# Patient Record
Sex: Male | Born: 1955 | Race: White | Hispanic: No | Marital: Married | Smoking: Former smoker
Health system: Southern US, Community
[De-identification: ages and names within clinical notes are randomized; demographics above are authoritative.]

## PROBLEM LIST (undated history)

## (undated) DIAGNOSIS — M549 Dorsalgia, unspecified: Secondary | ICD-10-CM

## (undated) DIAGNOSIS — C4442 Squamous cell carcinoma of skin of scalp and neck: Secondary | ICD-10-CM

## (undated) DIAGNOSIS — I10 Essential (primary) hypertension: Secondary | ICD-10-CM

## (undated) DIAGNOSIS — E782 Mixed hyperlipidemia: Secondary | ICD-10-CM

## (undated) HISTORY — PX: BACK SURGERY: SHX140

---

## 1998-11-09 HISTORY — PX: CATARACT EXTRACTION, BILATERAL: SHX1313

## 2000-10-20 ENCOUNTER — Ambulatory Visit (HOSPITAL_BASED_OUTPATIENT_CLINIC_OR_DEPARTMENT_OTHER): Admission: RE | Admit: 2000-10-20 | Discharge: 2000-10-20 | Payer: Self-pay | Admitting: *Deleted

## 2001-11-08 ENCOUNTER — Emergency Department (HOSPITAL_COMMUNITY): Admission: EM | Admit: 2001-11-08 | Discharge: 2001-11-08 | Payer: Self-pay | Admitting: Emergency Medicine

## 2001-11-08 ENCOUNTER — Encounter: Payer: Self-pay | Admitting: Emergency Medicine

## 2003-05-18 ENCOUNTER — Emergency Department (HOSPITAL_COMMUNITY): Admission: EM | Admit: 2003-05-18 | Discharge: 2003-05-19 | Payer: Self-pay | Admitting: Emergency Medicine

## 2003-05-20 ENCOUNTER — Encounter: Payer: Self-pay | Admitting: Emergency Medicine

## 2003-05-20 ENCOUNTER — Emergency Department (HOSPITAL_COMMUNITY): Admission: EM | Admit: 2003-05-20 | Discharge: 2003-05-20 | Payer: Self-pay | Admitting: Emergency Medicine

## 2008-11-09 HISTORY — PX: MENISCUS REPAIR: SHX5179

## 2018-07-29 ENCOUNTER — Emergency Department: Payer: BLUE CROSS/BLUE SHIELD

## 2018-07-29 ENCOUNTER — Emergency Department
Admission: EM | Admit: 2018-07-29 | Discharge: 2018-07-29 | Disposition: A | Discharge status: Home / Self Care | Payer: BLUE CROSS/BLUE SHIELD | Attending: Emergency Medicine | Admitting: Emergency Medicine

## 2018-07-29 ENCOUNTER — Encounter: Payer: Self-pay | Admitting: Emergency Medicine

## 2018-07-29 DIAGNOSIS — N390 Urinary tract infection, site not specified: Secondary | ICD-10-CM | POA: Diagnosis not present

## 2018-07-29 DIAGNOSIS — R000 Tachycardia, unspecified: Secondary | ICD-10-CM | POA: Diagnosis present

## 2018-07-29 LAB — URINALYSIS, COMPLETE (UACMP) WITH MICROSCOPIC
Bilirubin Urine: NEGATIVE
Glucose, UA: NEGATIVE mg/dL
Ketones, ur: 5 mg/dL — AB
Nitrite: NEGATIVE
Protein, ur: 30 mg/dL — AB
Specific Gravity, Urine: 1.021 (ref 1.005–1.030)
Squamous Epithelial / LPF: NONE SEEN (ref 0–5)
WBC, UA: >50 WBC/hpf — ABNORMAL HIGH (ref 0–5)
pH: 5 (ref 5.0–8.0)

## 2018-07-29 LAB — CBC
HCT: 45.9 % (ref 40.0–52.0)
Hemoglobin: 15.6 g/dL (ref 13.0–18.0)
MCH: 28 pg (ref 26.0–34.0)
MCHC: 33.9 g/dL (ref 32.0–36.0)
MCV: 82.8 fL (ref 80.0–100.0)
Platelets: 259 10*3/uL (ref 150–440)
RBC: 5.55 MIL/uL (ref 4.40–5.90)
RDW: 14.6 % — ABNORMAL HIGH (ref 11.5–14.5)
WBC: 16.5 10*3/uL — ABNORMAL HIGH (ref 3.8–10.6)

## 2018-07-29 LAB — BASIC METABOLIC PANEL
Anion gap: 9 (ref 5–15)
BUN: 16 mg/dL (ref 8–23)
CO2: 25 mmol/L (ref 22–32)
Calcium: 9.2 mg/dL (ref 8.9–10.3)
Chloride: 102 mmol/L (ref 98–111)
Creatinine, Ser: 1.24 mg/dL (ref 0.61–1.24)
GFR calc Af Amer: >60 mL/min (ref >60)
GFR, EST NON AFRICAN AMERICAN: 60 mL/min — AB (ref >60)
Glucose, Bld: 141 mg/dL — ABNORMAL HIGH (ref 70–99)
POTASSIUM: 3.8 mmol/L (ref 3.5–5.1)
Sodium: 136 mmol/L (ref 135–145)

## 2018-07-29 LAB — TROPONIN I: Troponin I: 0.03 ng/mL (ref <0.03)

## 2018-07-29 MED ORDER — ACETAMINOPHEN 325 MG PO TABS
650.0000 mg | ORAL_TABLET | Freq: Once | ORAL | Status: AC | PRN
  Administered 2018-07-29: 650 mg via ORAL
  Filled 2018-07-29: qty 2

## 2018-07-29 MED ORDER — CEPHALEXIN 500 MG PO CAPS: 500.0000 mg | ORAL_CAPSULE | Freq: Three times a day (TID) | ORAL | 0 refills | Status: DC

## 2018-07-29 MED ORDER — SODIUM CHLORIDE 0.9 % IV SOLN
1.0000 g | Freq: Once | INTRAVENOUS | Status: DC
  Filled 2018-07-29: qty 10

## 2018-07-29 NOTE — ED Notes (Signed)
RN attempted to administer rochephin, wife unhappy with the choice to administer antibiotics via IV. Dr. Archie Balboa made aware, will visit room to speak to wife

## 2018-07-29 NOTE — ED Triage Notes (Signed)
First Nurse Note:  Arrives from the [facility] for ED evaluation for c/o chills, dizziness,  nausea, generally not feeling well.  EKG done ant HR 100 - regular HR 40's.  AAOx3.  Skin warm and dry.  Ambulates with easy and steady gait.  NAD

## 2018-07-29 NOTE — ED Triage Notes (Signed)
Pt reports yesterday evening started with some SOB, dizziness and a fast heart rate. Pt reports went to MD today and was advised to come to the ED because his heart rate was fast. Pt states he usually runs in the 40's.

## 2018-07-29 NOTE — Discharge Instructions (Addendum)
Please seek medical attention for any high fevers, chest pain, shortness of breath, change in behavior, persistent vomiting, bloody stool or any other new or concerning symptoms.  

## 2018-07-29 NOTE — ED Provider Notes (Signed)
[facility] Emergency Department Provider Note   ____________________________________________   I have reviewed the triage vital signs and the nursing notes.   HISTORY  Chief Complaint Tachycardia and Dizziness   History limited by: Not Limited   HPI Alexander Quinn is a 62 y.o. male who presents to the emergency department today because of concerns for tachycardia and feeling unwell.  Patient states that he started feeling bad for the past few days.  Is just been feeling weak.  Today he felt like he was coming down the flu.  He was having body aches.  The patient states that he has noticed recently increased frequency of urination without significant output.  He denies any painful urination.  Does state he has a history of kidney stones however does not feel like he currently is passing one. Denies any fevers.   History reviewed. No pertinent past medical history.  There are no active problems to display for this patient.   History reviewed. No pertinent surgical history.  Prior to Admission medications   Not on File    Allergies Amlodipine and Simvastatin-high dose  No family history on file.  Social History Social History   Tobacco Use  . Smoking status: Not on file  Substance Use Topics  . Alcohol use: Not on file  . Drug use: Not on file    Review of Systems Constitutional: Positive for generalized weakness Eyes: No visual changes. ENT: No sore throat. Cardiovascular: Denies chest pain. Respiratory: Denies shortness of breath. Gastrointestinal: No abdominal pain.  No nausea, no vomiting.  No diarrhea.   Genitourinary: Positive for increased urinary frequency Musculoskeletal: Negative for back pain. Skin: Negative for rash. Neurological: Negative for headaches, focal weakness or numbness.  ____________________________________________   PHYSICAL EXAM:  VITAL SIGNS: ED Triage Vitals  Enc Vitals Group     BP 07/29/18 0955 (!)  155/75     Pulse Rate 07/29/18 0955 (!) 116     Resp 07/29/18 0955 20     Temp 07/29/18 1001 (!) 102.3 F (39.1 C)     Temp Source 07/29/18 1001 Oral     SpO2 07/29/18 0955 95 %     Weight 07/29/18 0958 205 lb (93 kg)     Height 07/29/18 0958 5\' 8"  (1.727 m)     Head Circumference --      Peak Flow --      Pain Score 07/29/18 0958 0   Constitutional: Alert and oriented.  Eyes: Conjunctivae are normal.  ENT      Head: Normocephalic and atraumatic.      Nose: No congestion/rhinnorhea.      Mouth/Throat: Mucous membranes are moist.      Neck: No stridor. Hematological/Lymphatic/Immunilogical: No cervical lymphadenopathy. Cardiovascular: Normal rate, regular rhythm.  No murmurs, rubs, or gallops.  Respiratory: Normal respiratory effort without tachypnea nor retractions. Breath sounds are clear and equal bilaterally. No wheezes/rales/rhonchi. Gastrointestinal: Soft and non tender. No rebound. No guarding.  Genitourinary: Deferred Musculoskeletal: Normal range of motion in all extremities. No lower extremity edema. Neurologic:  Normal speech and language. No gross focal neurologic deficits are appreciated.  Skin:  Skin is warm, dry and intact. No rash noted. Psychiatric: Mood and affect are normal. Speech and behavior are normal. Patient exhibits appropriate insight and judgment.  ____________________________________________    LABS (pertinent positives/negatives)  Trop 0.03 CBC wbc 16.5, hgb 15.6, plt 259 BMP wnl except glu 141 UA ? 50 wbc, many bacteria  ____________________________________________   EKG  Apolonio Schneiders, attending physician, personally viewed and interpreted this EKG  EKG Time: 0957 Rate: 110 Rhythm: sinus tachycardia Axis: normal Intervals: qtc 414 QRS: narrow ST changes: no st elevation Impression: abnormal ekg   ____________________________________________    RADIOLOGY  CXR No  pneumonia  ____________________________________________   PROCEDURES  Procedures  ____________________________________________   INITIAL IMPRESSION / ASSESSMENT AND PLAN / ED COURSE  Pertinent labs & imaging results that were available during my care of the patient were reviewed by me and considered in my medical decision making (see chart for details).   Patient presented to the emergency department today because of concerns for weakness and tachycardia.  Concern for infection.  Patient was febrile and tachycardic.  Work-up is consistent with urinary tract infection.  I did recommend IV antibiotics however patient declined.  He said he felt okay at this time.  Was comfortable trying oral medications.  States he will follow-up closely with his urologist.  ____________________________________________   FINAL CLINICAL IMPRESSION(S) / ED DIAGNOSES  Final diagnoses:  Lower urinary tract infection     Note: This dictation was prepared with Dragon dictation. Any transcriptional errors that result from this process are unintentional     Nance Pear, MD 07/29/18 1521

## 2018-07-29 NOTE — ED Triage Notes (Signed)
Pt also reports dark urine and not urinating as much.

## 2018-07-31 LAB — URINE CULTURE: Culture: >=100000 — AB

## 2018-08-01 NOTE — Consult Note (Signed)
ED Antimicrobial Stewardship Positive Culture Follow Up   Alexander Quinn is an 62 y.o. male who presented to [facility] on 07/29/2018 with a chief complaint of  Chief Complaint  Patient presents with  . Tachycardia  . Dizziness    Recent Results (from the past 720 hour(s))  Urine Culture     Status: Abnormal   Collection Time: 07/29/18 10:07 AM  Result Value Ref Range Status   Specimen Description   Final    URINE, RANDOM Performed at [facility], [address] [place], [place] [address]    Special Requests   Final    NONE Performed at [facility], [address] [place], [place] [address]    Culture >=100,000 COLONIES/mL ENTEROBACTER CLOACAE (A)  Final   Report Status 07/31/2018 FINAL  Final   Organism ID, Bacteria ENTEROBACTER CLOACAE (A)  Final      Susceptibility   Enterobacter cloacae - MIC*    CEFAZOLIN >=64 RESISTANT Resistant     CEFTRIAXONE <=1 SENSITIVE Sensitive     CIPROFLOXACIN <=0.25 SENSITIVE Sensitive     GENTAMICIN <=1 SENSITIVE Sensitive     IMIPENEM 0.5 SENSITIVE Sensitive     NITROFURANTOIN <=16 SENSITIVE Sensitive     TRIMETH/SULFA <=20 SENSITIVE Sensitive     PIP/TAZO <=4 SENSITIVE Sensitive     * >=100,000 COLONIES/mL ENTEROBACTER CLOACAE    [x]  Treated with Cephalexin, organism resistant to prescribed antimicrobial  New antibiotic prescription: Cipro 500mg  BID x 7 days  ED Provider: Dr. Lenise Arena   Patient returned call to ED pharmacist @ 1515 on 9/23. UCx and Abx plan were discussed. Patient verbalized understanding. New RX was called [facility] to [facility] [address] in [place] @ 1525 on 9/23.   Pernell Dupre, PharmD, BCPS Clinical Pharmacist 08/01/2018 3:33 PM

## 2018-11-09 HISTORY — PX: COLONOSCOPY: SHX174

## 2019-05-23 ENCOUNTER — Ambulatory Visit: Payer: 59

## 2019-05-23 ENCOUNTER — Other Ambulatory Visit: Payer: Self-pay

## 2019-05-23 DIAGNOSIS — Z0000 Encounter for general adult medical examination without abnormal findings: Secondary | ICD-10-CM

## 2019-05-23 NOTE — Addendum Note (Signed)
Addended by: Aliene Altes on: 05/23/2019 09:17 AM   Modules accepted: Orders

## 2019-05-24 DIAGNOSIS — H2513 Age-related nuclear cataract, bilateral: Secondary | ICD-10-CM | POA: Diagnosis not present

## 2019-05-24 DIAGNOSIS — H35362 Drusen (degenerative) of macula, left eye: Secondary | ICD-10-CM | POA: Diagnosis not present

## 2019-05-24 DIAGNOSIS — H2511 Age-related nuclear cataract, right eye: Secondary | ICD-10-CM | POA: Diagnosis not present

## 2019-05-24 DIAGNOSIS — H25013 Cortical age-related cataract, bilateral: Secondary | ICD-10-CM | POA: Diagnosis not present

## 2019-05-24 DIAGNOSIS — H35033 Hypertensive retinopathy, bilateral: Secondary | ICD-10-CM | POA: Diagnosis not present

## 2019-05-24 LAB — CMP12+LP+TP+TSH+6AC+PSA+CBC…
ALT: 24 IU/L (ref 0–44)
AST: 21 IU/L (ref 0–40)
Albumin/Globulin Ratio: 1.6 (ref 1.2–2.2)
Albumin: 4.4 g/dL (ref 3.8–4.8)
Alkaline Phosphatase: 64 IU/L (ref 39–117)
BUN/Creatinine Ratio: 15 (ref 10–24)
BUN: 16 mg/dL (ref 8–27)
Basophils Absolute: 0 10*3/uL (ref 0.0–0.2)
Bilirubin Total: 0.4 mg/dL (ref 0.0–1.2)
Calcium: 9.5 mg/dL (ref 8.6–10.2)
Chloride: 104 mmol/L (ref 96–106)
Chol/HDL Ratio: 6.3 ratio — ABNORMAL HIGH (ref 0.0–5.0)
Cholesterol, Total: 233 mg/dL — ABNORMAL HIGH (ref 100–199)
Creatinine, Ser: 1.1 mg/dL (ref 0.76–1.27)
EOS (ABSOLUTE): 0.3 10*3/uL (ref 0.0–0.4)
Eos: 7 %
Estimated CHD Risk: 1.3 times avg. — ABNORMAL HIGH (ref 0.0–1.0)
Free Thyroxine Index: 1.5 (ref 1.2–4.9)
GFR calc Af Amer: 81 mL/min/{1.73_m2} (ref >59)
GFR calc non Af Amer: 70 mL/min/{1.73_m2} (ref >59)
GGT: 53 IU/L (ref 0–65)
Globulin, Total: 2.7 g/dL (ref 1.5–4.5)
HDL: 37 mg/dL — ABNORMAL LOW (ref >39)
Hematocrit: 47.6 % (ref 37.5–51.0)
Hemoglobin: 15.5 g/dL (ref 13.0–17.7)
Immature Grans (Abs): 0 10*3/uL (ref 0.0–0.1)
Immature Granulocytes: 0 %
Iron: 112 ug/dL (ref 38–169)
LDH: 200 IU/L (ref 121–224)
LDL Calculated: 171 mg/dL — ABNORMAL HIGH (ref 0–99)
Lymphocytes Absolute: 1.4 10*3/uL (ref 0.7–3.1)
Lymphs: 32 %
MCH: 26.8 pg (ref 26.6–33.0)
MCHC: 32.6 g/dL (ref 31.5–35.7)
MCV: 82 fL (ref 79–97)
Monocytes Absolute: 0.6 10*3/uL (ref 0.1–0.9)
Monocytes: 14 %
Neutrophils Absolute: 2.1 10*3/uL (ref 1.4–7.0)
Neutrophils: 47 %
Phosphorus: 3.1 mg/dL (ref 2.8–4.1)
Platelets: 368 10*3/uL (ref 150–450)
Potassium: 5 mmol/L (ref 3.5–5.2)
Prostate Specific Ag, Serum: 6.3 ng/mL — ABNORMAL HIGH (ref 0.0–4.0)
RBC: 5.78 x10E6/uL (ref 4.14–5.80)
RDW: 13.3 % (ref 11.6–15.4)
Sodium: 140 mmol/L (ref 134–144)
T3 Uptake Ratio: 25 % (ref 24–39)
TSH: 1.46 u[IU]/mL (ref 0.450–4.500)
Total Protein: 7.1 g/dL (ref 6.0–8.5)
Triglycerides: 126 mg/dL (ref 0–149)
Uric Acid: 5.3 mg/dL (ref 3.7–8.6)
VLDL Cholesterol Cal: 25 mg/dL (ref 5–40)
WBC: 4.5 10*3/uL (ref 3.4–10.8)

## 2019-05-24 LAB — CMP12+LP+TP+TSH+6AC+PSA+CBC?
Basos: 0 %
Glucose: 86 mg/dL (ref 65–99)
T4, Total: 6 ug/dL (ref 4.5–12.0)

## 2019-06-06 DIAGNOSIS — H2511 Age-related nuclear cataract, right eye: Secondary | ICD-10-CM | POA: Diagnosis not present

## 2019-06-06 DIAGNOSIS — H25811 Combined forms of age-related cataract, right eye: Secondary | ICD-10-CM | POA: Diagnosis not present

## 2019-06-13 DIAGNOSIS — H2511 Age-related nuclear cataract, right eye: Secondary | ICD-10-CM | POA: Diagnosis not present

## 2019-06-22 DIAGNOSIS — H25042 Posterior subcapsular polar age-related cataract, left eye: Secondary | ICD-10-CM | POA: Diagnosis not present

## 2019-06-22 DIAGNOSIS — H2512 Age-related nuclear cataract, left eye: Secondary | ICD-10-CM | POA: Diagnosis not present

## 2019-06-22 DIAGNOSIS — H25012 Cortical age-related cataract, left eye: Secondary | ICD-10-CM | POA: Diagnosis not present

## 2019-06-26 DIAGNOSIS — Z23 Encounter for immunization: Secondary | ICD-10-CM | POA: Diagnosis not present

## 2019-06-26 DIAGNOSIS — Z0000 Encounter for general adult medical examination without abnormal findings: Secondary | ICD-10-CM | POA: Diagnosis not present

## 2019-06-27 DIAGNOSIS — H25012 Cortical age-related cataract, left eye: Secondary | ICD-10-CM | POA: Diagnosis not present

## 2019-06-27 DIAGNOSIS — H25042 Posterior subcapsular polar age-related cataract, left eye: Secondary | ICD-10-CM | POA: Diagnosis not present

## 2019-06-27 DIAGNOSIS — H2512 Age-related nuclear cataract, left eye: Secondary | ICD-10-CM | POA: Diagnosis not present

## 2019-06-27 DIAGNOSIS — H25812 Combined forms of age-related cataract, left eye: Secondary | ICD-10-CM | POA: Diagnosis not present

## 2019-07-04 DIAGNOSIS — H2512 Age-related nuclear cataract, left eye: Secondary | ICD-10-CM | POA: Diagnosis not present

## 2019-07-07 ENCOUNTER — Telehealth: Payer: Self-pay

## 2019-07-07 NOTE — Telephone Encounter (Signed)
Alexander Quinn presents to the office with Lab orders from Dawson Bills, NP at [facility].  States his appt was with her yesterday (07/06/2019).  He needs CBC, BMP, HFP, Sed Rate, CRP and [id number] Quntiferon -TB Gold Plus.  Lab appt scheduled for 07/10/2019 at 9:00am  AMD

## 2019-07-10 ENCOUNTER — Other Ambulatory Visit: Payer: Self-pay

## 2019-08-21 ENCOUNTER — Other Ambulatory Visit: Payer: Self-pay

## 2019-08-21 ENCOUNTER — Ambulatory Visit: Payer: Self-pay

## 2019-08-21 DIAGNOSIS — Z23 Encounter for immunization: Secondary | ICD-10-CM

## 2019-08-29 DIAGNOSIS — M1711 Unilateral primary osteoarthritis, right knee: Secondary | ICD-10-CM | POA: Diagnosis not present

## 2019-08-30 DIAGNOSIS — M1711 Unilateral primary osteoarthritis, right knee: Secondary | ICD-10-CM | POA: Diagnosis not present

## 2019-09-05 DIAGNOSIS — Z859 Personal history of malignant neoplasm, unspecified: Secondary | ICD-10-CM | POA: Diagnosis not present

## 2019-09-05 DIAGNOSIS — L570 Actinic keratosis: Secondary | ICD-10-CM | POA: Diagnosis not present

## 2019-09-05 DIAGNOSIS — L578 Other skin changes due to chronic exposure to nonionizing radiation: Secondary | ICD-10-CM | POA: Diagnosis not present

## 2019-09-05 DIAGNOSIS — L853 Xerosis cutis: Secondary | ICD-10-CM | POA: Diagnosis not present

## 2019-09-05 DIAGNOSIS — L821 Other seborrheic keratosis: Secondary | ICD-10-CM | POA: Diagnosis not present

## 2019-12-07 DIAGNOSIS — R972 Elevated prostate specific antigen [PSA]: Secondary | ICD-10-CM | POA: Diagnosis not present

## 2019-12-11 HISTORY — PX: LITHOTRIPSY: SUR834

## 2019-12-22 DIAGNOSIS — N200 Calculus of kidney: Secondary | ICD-10-CM | POA: Diagnosis not present

## 2019-12-27 ENCOUNTER — Other Ambulatory Visit: Payer: Self-pay

## 2019-12-27 DIAGNOSIS — N281 Cyst of kidney, acquired: Secondary | ICD-10-CM

## 2019-12-27 DIAGNOSIS — N35911 Unspecified urethral stricture, male, meatal: Secondary | ICD-10-CM

## 2019-12-27 DIAGNOSIS — N5201 Erectile dysfunction due to arterial insufficiency: Secondary | ICD-10-CM

## 2019-12-27 NOTE — Progress Notes (Signed)
Patient is here for labs today. Patient is having a procedure done with Dr.Stiff at [facility]. Results to be faxed to [phone]

## 2019-12-28 LAB — CBC WITH DIFFERENTIAL/PLATELET
Basophils Absolute: 0 10*3/uL (ref 0.0–0.2)
Basos: 0 %
EOS (ABSOLUTE): 0.3 10*3/uL (ref 0.0–0.4)
Eos: 5 %
Hematocrit: 44.4 % (ref 37.5–51.0)
Hemoglobin: 14.9 g/dL (ref 13.0–17.7)
Immature Grans (Abs): 0 10*3/uL (ref 0.0–0.1)
Immature Granulocytes: 0 %
Lymphocytes Absolute: 1.7 10*3/uL (ref 0.7–3.1)
Lymphs: 29 %
MCH: 27.4 pg (ref 26.6–33.0)
MCHC: 33.6 g/dL (ref 31.5–35.7)
MCV: 82 fL (ref 79–97)
Monocytes Absolute: 0.7 10*3/uL (ref 0.1–0.9)
Monocytes: 12 %
Neutrophils Absolute: 3.1 10*3/uL (ref 1.4–7.0)
Neutrophils: 54 %
Platelets: 394 10*3/uL (ref 150–450)
RBC: 5.44 x10E6/uL (ref 4.14–5.80)
RDW: 13.7 % (ref 11.6–15.4)
WBC: 5.9 10*3/uL (ref 3.4–10.8)

## 2019-12-28 LAB — BASIC METABOLIC PANEL
BUN/Creatinine Ratio: 12 (ref 10–24)
BUN: 15 mg/dL (ref 8–27)
CO2: 24 mmol/L (ref 20–29)
Calcium: 9.4 mg/dL (ref 8.6–10.2)
Chloride: 104 mmol/L (ref 96–106)
Creatinine, Ser: 1.22 mg/dL (ref 0.76–1.27)
GFR calc Af Amer: 71 mL/min/{1.73_m2} (ref >59)
GFR calc non Af Amer: 62 mL/min/{1.73_m2} (ref >59)
Glucose: 82 mg/dL (ref 65–99)
Potassium: 4.5 mmol/L (ref 3.5–5.2)
Sodium: 141 mmol/L (ref 134–144)

## 2019-12-29 ENCOUNTER — Telehealth: Payer: Self-pay

## 2019-12-29 NOTE — Telephone Encounter (Signed)
BMET & CBC results from 12/27/19 faxed to Dr. Bridgette Habermann of [facility] - Fax # [phone].  AMD

## 2019-12-30 DIAGNOSIS — Z20828 Contact with and (suspected) exposure to other viral communicable diseases: Secondary | ICD-10-CM | POA: Diagnosis not present

## 2019-12-30 DIAGNOSIS — Z01812 Encounter for preprocedural laboratory examination: Secondary | ICD-10-CM | POA: Diagnosis not present

## 2020-01-03 DIAGNOSIS — N200 Calculus of kidney: Secondary | ICD-10-CM | POA: Diagnosis not present

## 2020-01-26 DIAGNOSIS — N200 Calculus of kidney: Secondary | ICD-10-CM | POA: Diagnosis not present

## 2020-01-26 DIAGNOSIS — R972 Elevated prostate specific antigen [PSA]: Secondary | ICD-10-CM | POA: Diagnosis not present

## 2020-01-26 DIAGNOSIS — N401 Enlarged prostate with lower urinary tract symptoms: Secondary | ICD-10-CM | POA: Diagnosis not present

## 2020-01-29 DIAGNOSIS — M48061 Spinal stenosis, lumbar region without neurogenic claudication: Secondary | ICD-10-CM | POA: Diagnosis not present

## 2020-01-29 DIAGNOSIS — I10 Essential (primary) hypertension: Secondary | ICD-10-CM | POA: Diagnosis not present

## 2020-01-29 DIAGNOSIS — E6609 Other obesity due to excess calories: Secondary | ICD-10-CM | POA: Diagnosis not present

## 2020-01-29 DIAGNOSIS — Z79899 Other long term (current) drug therapy: Secondary | ICD-10-CM | POA: Diagnosis not present

## 2020-01-29 DIAGNOSIS — N200 Calculus of kidney: Secondary | ICD-10-CM | POA: Diagnosis not present

## 2020-02-21 DIAGNOSIS — N200 Calculus of kidney: Secondary | ICD-10-CM | POA: Diagnosis not present

## 2020-02-26 DIAGNOSIS — Z79899 Other long term (current) drug therapy: Secondary | ICD-10-CM | POA: Diagnosis not present

## 2020-02-26 DIAGNOSIS — R350 Frequency of micturition: Secondary | ICD-10-CM | POA: Diagnosis not present

## 2020-02-26 DIAGNOSIS — N200 Calculus of kidney: Secondary | ICD-10-CM | POA: Diagnosis not present

## 2020-02-26 DIAGNOSIS — I10 Essential (primary) hypertension: Secondary | ICD-10-CM | POA: Diagnosis not present

## 2020-02-26 DIAGNOSIS — R519 Headache, unspecified: Secondary | ICD-10-CM | POA: Diagnosis not present

## 2020-03-05 DIAGNOSIS — H35039 Hypertensive retinopathy, unspecified eye: Secondary | ICD-10-CM | POA: Diagnosis not present

## 2020-03-05 DIAGNOSIS — H43812 Vitreous degeneration, left eye: Secondary | ICD-10-CM | POA: Diagnosis not present

## 2020-03-06 DIAGNOSIS — N401 Enlarged prostate with lower urinary tract symptoms: Secondary | ICD-10-CM | POA: Diagnosis not present

## 2020-03-06 DIAGNOSIS — R972 Elevated prostate specific antigen [PSA]: Secondary | ICD-10-CM | POA: Diagnosis not present

## 2020-03-06 DIAGNOSIS — N200 Calculus of kidney: Secondary | ICD-10-CM | POA: Diagnosis not present

## 2020-03-09 HISTORY — PX: BIOPSY PROSTATE: PRO28

## 2020-03-26 DIAGNOSIS — D075 Carcinoma in situ of prostate: Secondary | ICD-10-CM | POA: Diagnosis not present

## 2020-04-09 DIAGNOSIS — R9431 Abnormal electrocardiogram [ECG] [EKG]: Secondary | ICD-10-CM

## 2020-04-09 HISTORY — DX: Abnormal electrocardiogram (ECG) (EKG): R94.31

## 2020-05-01 DIAGNOSIS — R079 Chest pain, unspecified: Secondary | ICD-10-CM | POA: Diagnosis not present

## 2020-05-01 DIAGNOSIS — I10 Essential (primary) hypertension: Secondary | ICD-10-CM | POA: Diagnosis not present

## 2020-05-06 ENCOUNTER — Other Ambulatory Visit: Payer: Self-pay

## 2020-05-06 DIAGNOSIS — E119 Type 2 diabetes mellitus without complications: Secondary | ICD-10-CM

## 2020-05-06 DIAGNOSIS — I10 Essential (primary) hypertension: Secondary | ICD-10-CM

## 2020-05-06 DIAGNOSIS — Z125 Encounter for screening for malignant neoplasm of prostate: Secondary | ICD-10-CM

## 2020-05-06 DIAGNOSIS — R0609 Other forms of dyspnea: Secondary | ICD-10-CM

## 2020-05-06 DIAGNOSIS — Z0000 Encounter for general adult medical examination without abnormal findings: Secondary | ICD-10-CM

## 2020-05-06 DIAGNOSIS — E785 Hyperlipidemia, unspecified: Secondary | ICD-10-CM

## 2020-05-06 DIAGNOSIS — R079 Chest pain, unspecified: Secondary | ICD-10-CM

## 2020-05-06 NOTE — Progress Notes (Addendum)
PCP is Dr. Lucianne Muss of [facility] to be faxed to him.  [address], [place]  [address] P:  [phone] F:  [phone]  Anola Gurney message that he's had a Hep C test several years ago & that it was negative.  AMD

## 2020-05-07 LAB — CBC WITH DIFFERENTIAL/PLATELET
Basophils Absolute: 0 10*3/uL (ref 0.0–0.2)
Basos: 1 %
EOS (ABSOLUTE): 0.2 10*3/uL (ref 0.0–0.4)
Eos: 5 %
Hematocrit: 44.7 % (ref 37.5–51.0)
Hemoglobin: 15.2 g/dL (ref 13.0–17.7)
Immature Grans (Abs): 0 10*3/uL (ref 0.0–0.1)
Immature Granulocytes: 0 %
Lymphocytes Absolute: 1.4 10*3/uL (ref 0.7–3.1)
Lymphs: 33 %
MCH: 27.2 pg (ref 26.6–33.0)
MCHC: 34 g/dL (ref 31.5–35.7)
MCV: 80 fL (ref 79–97)
Monocytes Absolute: 0.7 10*3/uL (ref 0.1–0.9)
Monocytes: 16 %
Neutrophils Absolute: 2 10*3/uL (ref 1.4–7.0)
Neutrophils: 45 %
Platelets: 332 10*3/uL (ref 150–450)
RBC: 5.58 x10E6/uL (ref 4.14–5.80)
RDW: 13.6 % (ref 11.6–15.4)
WBC: 4.3 10*3/uL (ref 3.4–10.8)

## 2020-05-07 LAB — COMPREHENSIVE METABOLIC PANEL WITH GFR
ALT: 33 IU/L (ref 0–44)
AST: 31 IU/L (ref 0–40)
Albumin/Globulin Ratio: 1.8 (ref 1.2–2.2)
Albumin: 4.4 g/dL (ref 3.8–4.8)
Alkaline Phosphatase: 78 IU/L (ref 48–121)
BUN/Creatinine Ratio: 14 (ref 10–24)
BUN: 16 mg/dL (ref 8–27)
Bilirubin Total: 0.4 mg/dL (ref 0.0–1.2)
CO2: 24 mmol/L (ref 20–29)
Calcium: 9.8 mg/dL (ref 8.6–10.2)
Chloride: 106 mmol/L (ref 96–106)
Creatinine, Ser: 1.12 mg/dL (ref 0.76–1.27)
GFR calc Af Amer: 79 mL/min/1.73
GFR calc non Af Amer: 68 mL/min/1.73
Globulin, Total: 2.4 g/dL (ref 1.5–4.5)
Glucose: 86 mg/dL (ref 65–99)
Potassium: 5.7 mmol/L — ABNORMAL HIGH (ref 3.5–5.2)
Sodium: 142 mmol/L (ref 134–144)
Total Protein: 6.8 g/dL (ref 6.0–8.5)

## 2020-05-07 LAB — LIPID PANEL
Chol/HDL Ratio: 4.9 ratio (ref 0.0–5.0)
Cholesterol, Total: 205 mg/dL — ABNORMAL HIGH (ref 100–199)
HDL: 42 mg/dL
LDL Chol Calc (NIH): 140 mg/dL — ABNORMAL HIGH (ref 0–99)
Triglycerides: 128 mg/dL (ref 0–149)
VLDL Cholesterol Cal: 23 mg/dL (ref 5–40)

## 2020-05-07 LAB — CK: Total CK: 189 U/L (ref 41–331)

## 2020-05-07 LAB — TSH: TSH: 2.58 u[IU]/mL (ref 0.450–4.500)

## 2020-05-07 LAB — BRAIN NATRIURETIC PEPTIDE

## 2020-05-07 LAB — HGB A1C W/O EAG: Hgb A1c MFr Bld: 5.7 % — ABNORMAL HIGH (ref 4.8–5.6)

## 2020-05-07 LAB — T4, FREE: Free T4: 1 ng/dL (ref 0.82–1.77)

## 2020-05-07 LAB — PSA: Prostate Specific Ag, Serum: 5.4 ng/mL — ABNORMAL HIGH (ref 0.0–4.0)

## 2020-05-09 DIAGNOSIS — I10 Essential (primary) hypertension: Secondary | ICD-10-CM | POA: Insufficient documentation

## 2020-05-09 DIAGNOSIS — E785 Hyperlipidemia, unspecified: Secondary | ICD-10-CM | POA: Insufficient documentation

## 2020-06-19 DIAGNOSIS — I10 Essential (primary) hypertension: Secondary | ICD-10-CM | POA: Diagnosis not present

## 2020-06-19 DIAGNOSIS — R079 Chest pain, unspecified: Secondary | ICD-10-CM | POA: Diagnosis not present

## 2020-06-19 DIAGNOSIS — R072 Precordial pain: Secondary | ICD-10-CM | POA: Diagnosis not present

## 2020-06-19 DIAGNOSIS — E782 Mixed hyperlipidemia: Secondary | ICD-10-CM | POA: Diagnosis not present

## 2020-07-01 DIAGNOSIS — I517 Cardiomegaly: Secondary | ICD-10-CM | POA: Diagnosis not present

## 2020-07-01 DIAGNOSIS — I088 Other rheumatic multiple valve diseases: Secondary | ICD-10-CM | POA: Diagnosis not present

## 2020-07-04 DIAGNOSIS — R972 Elevated prostate specific antigen [PSA]: Secondary | ICD-10-CM | POA: Diagnosis not present

## 2020-07-04 DIAGNOSIS — N200 Calculus of kidney: Secondary | ICD-10-CM | POA: Diagnosis not present

## 2020-07-18 IMAGING — CR DG CHEST 2V
1 series · 2 of 2 positions shown · non-contrast
Comparison: None.

CLINICAL DATA: Tachycardia

EXAM:
CHEST - 2 VIEW

[Series 1: dg chest 2 view · 0.14mm/px · 2 of 2 slices shown]
[im 1/2]
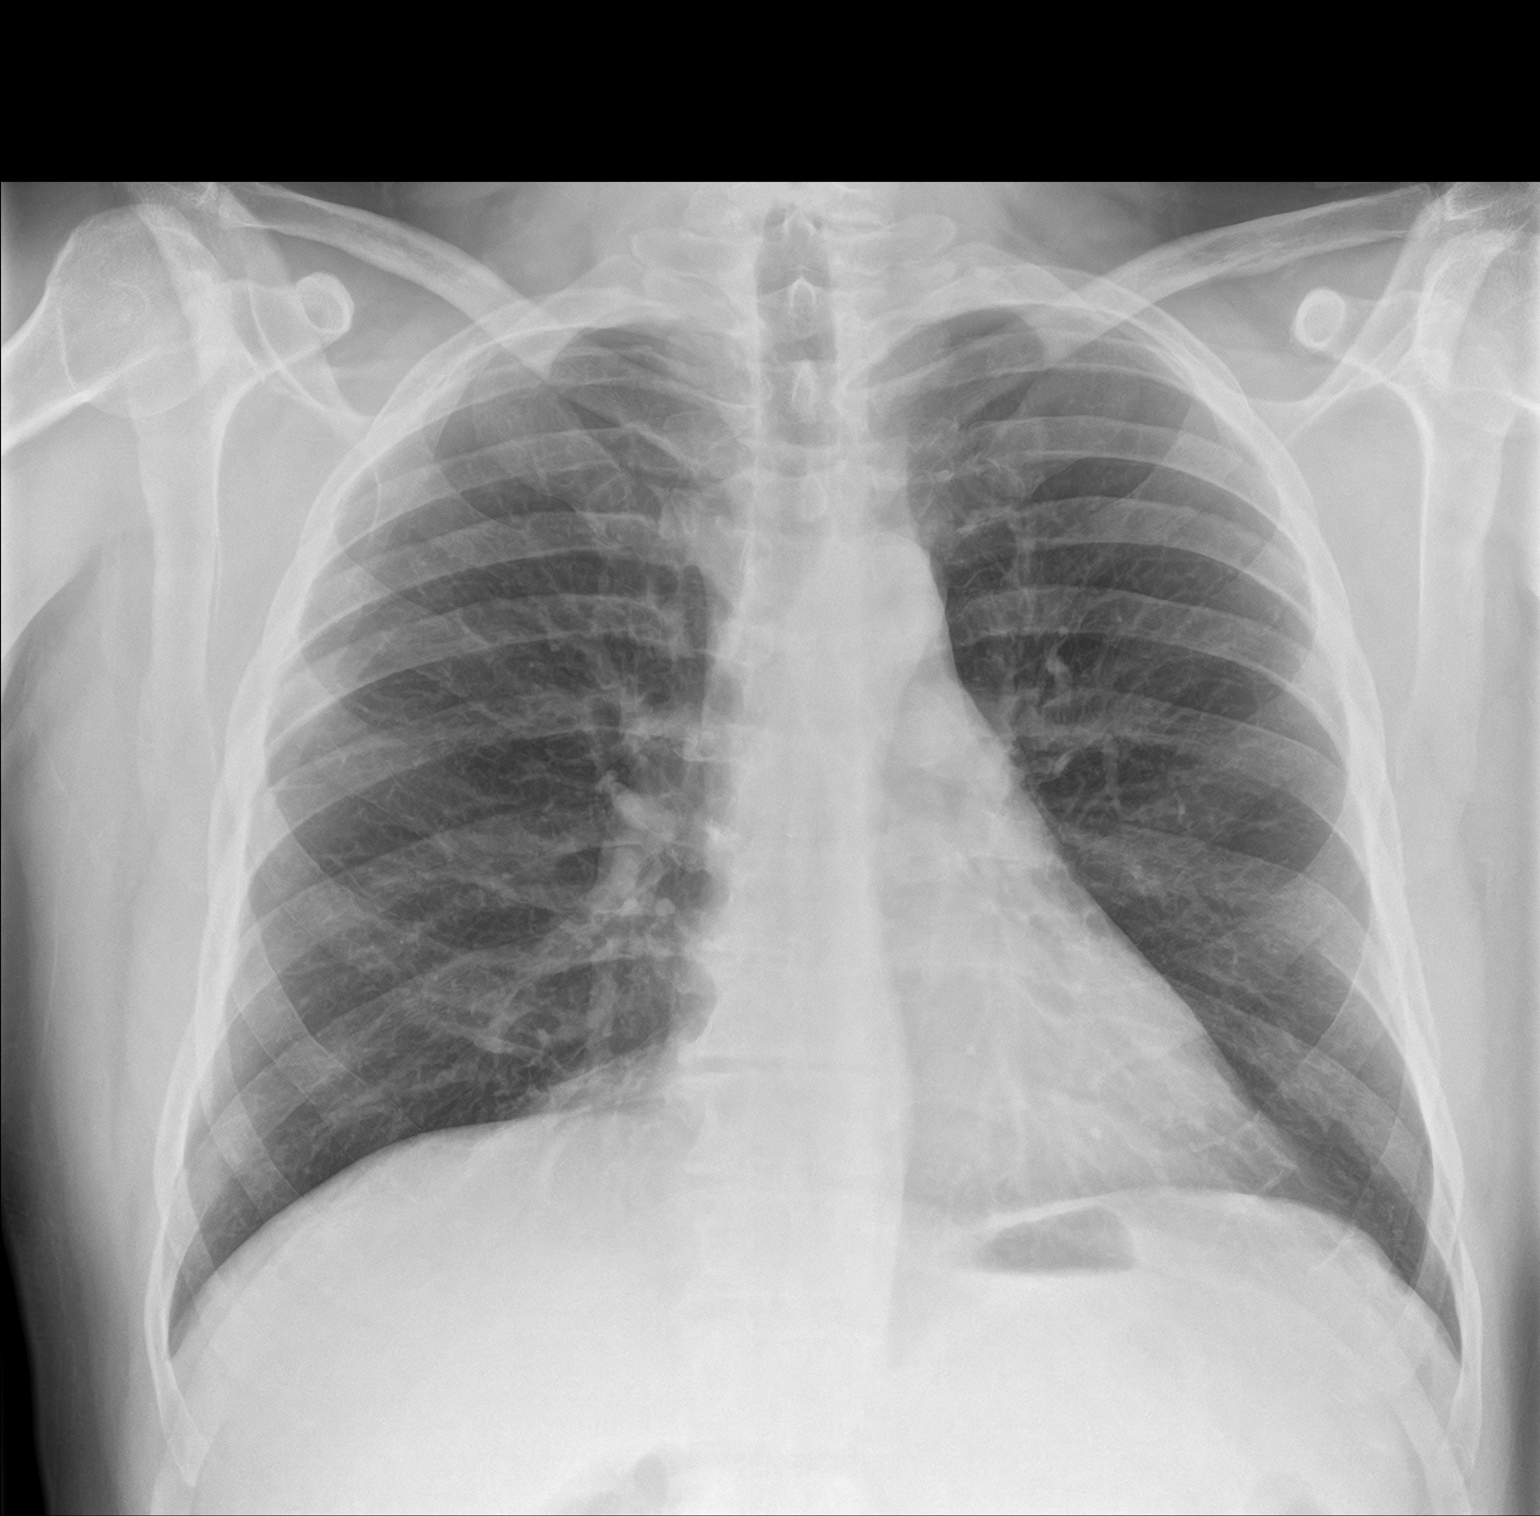
[im 2/2]
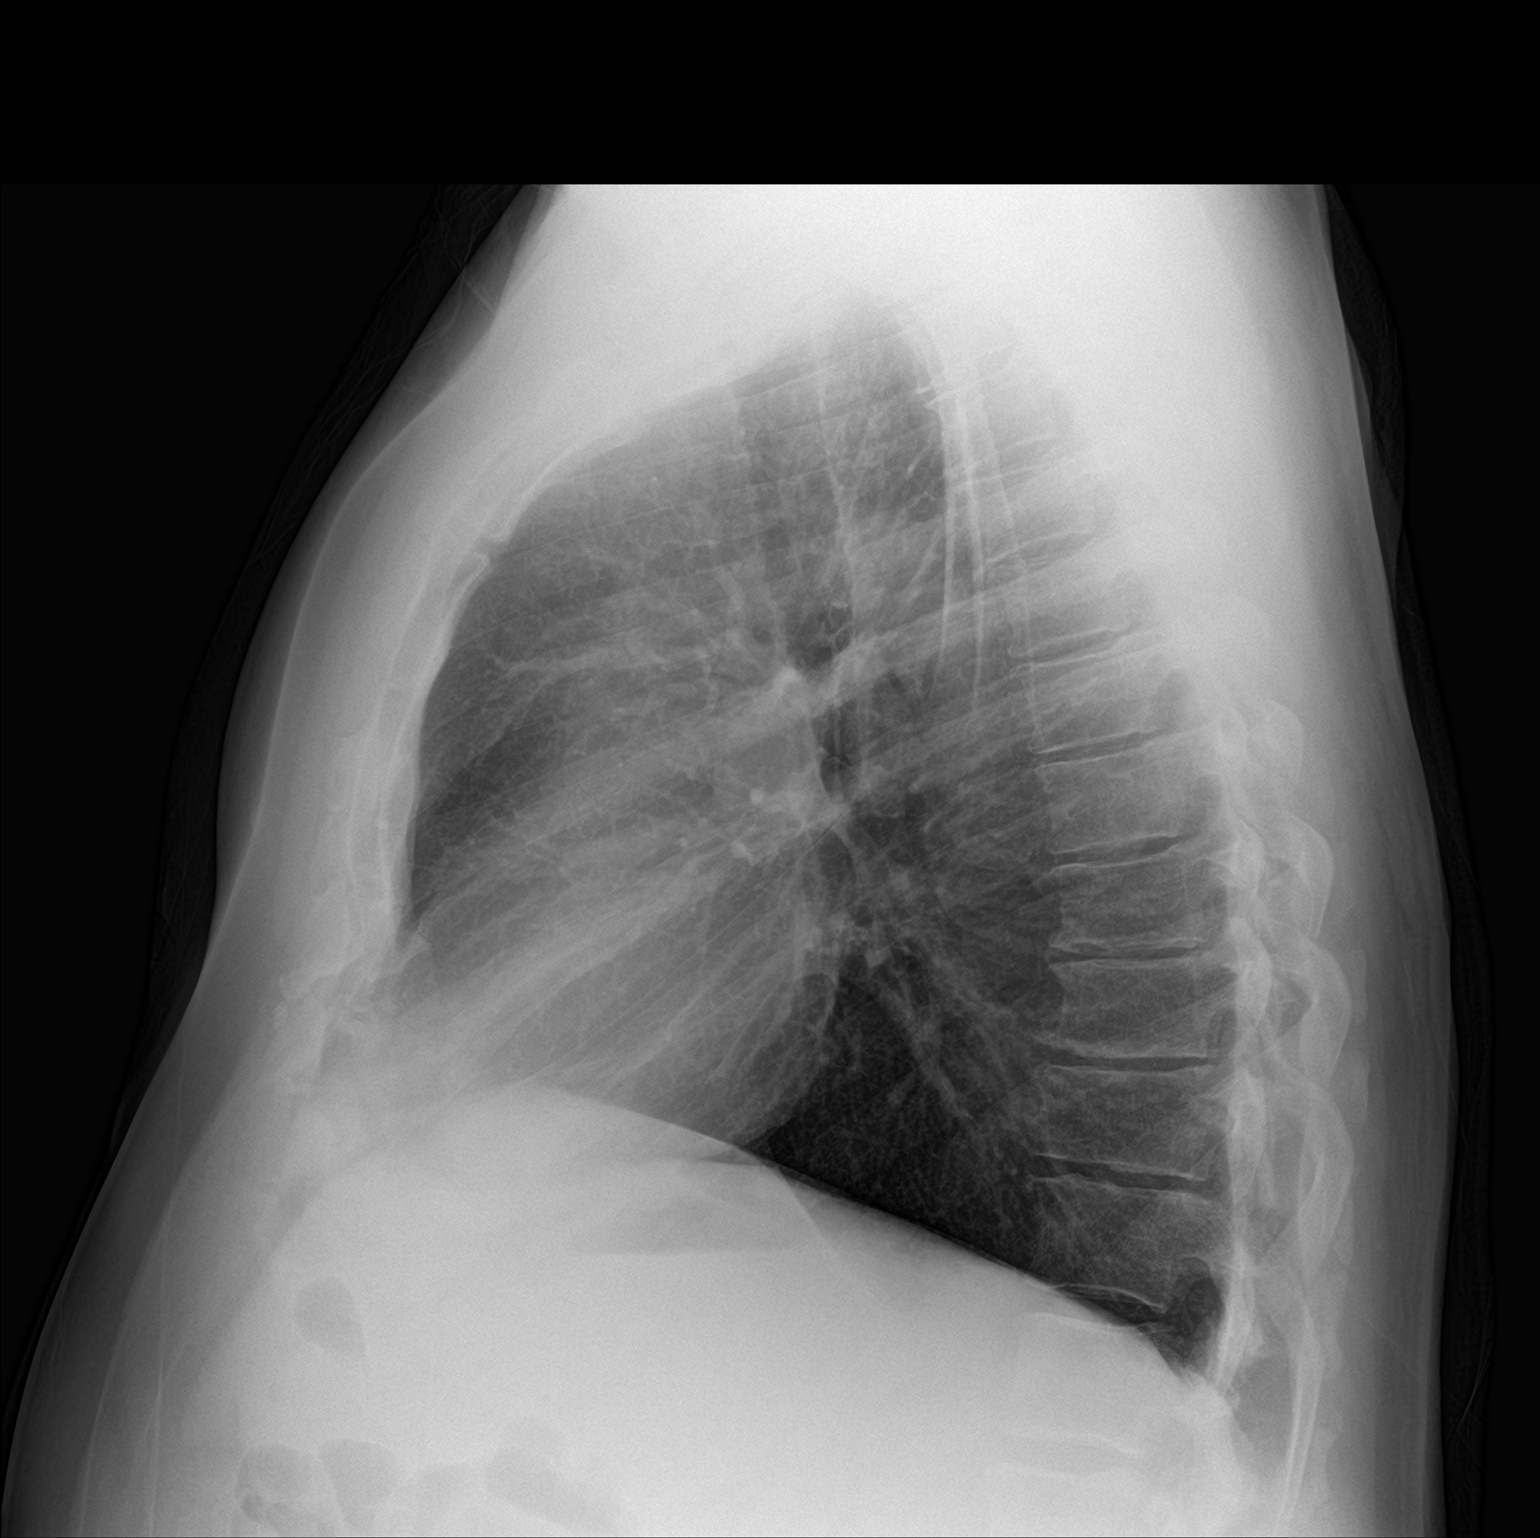

[2 of 2 positions shown; findings below may reference images not displayed]

FINDINGS: Lungs are clear. Heart size and pulmonary vascularity are normal. No
adenopathy. No bone lesions.
IMPRESSION: No edema or consolidation.

## 2020-09-04 DIAGNOSIS — Z872 Personal history of diseases of the skin and subcutaneous tissue: Secondary | ICD-10-CM | POA: Diagnosis not present

## 2020-09-04 DIAGNOSIS — D239 Other benign neoplasm of skin, unspecified: Secondary | ICD-10-CM | POA: Diagnosis not present

## 2020-09-04 DIAGNOSIS — Z859 Personal history of malignant neoplasm, unspecified: Secondary | ICD-10-CM | POA: Diagnosis not present

## 2020-09-04 DIAGNOSIS — L570 Actinic keratosis: Secondary | ICD-10-CM | POA: Diagnosis not present

## 2020-09-04 DIAGNOSIS — L853 Xerosis cutis: Secondary | ICD-10-CM | POA: Diagnosis not present

## 2020-09-04 DIAGNOSIS — L720 Epidermal cyst: Secondary | ICD-10-CM | POA: Diagnosis not present

## 2020-09-04 DIAGNOSIS — L578 Other skin changes due to chronic exposure to nonionizing radiation: Secondary | ICD-10-CM | POA: Diagnosis not present

## 2020-09-18 ENCOUNTER — Ambulatory Visit: Payer: Self-pay

## 2020-09-18 DIAGNOSIS — Z23 Encounter for immunization: Secondary | ICD-10-CM

## 2021-02-17 NOTE — Progress Notes (Deleted)
Pt scheduled to complete physical 02/19/21 with Randel Pigg, PA-C

## 2021-03-21 DIAGNOSIS — H5203 Hypermetropia, bilateral: Secondary | ICD-10-CM | POA: Diagnosis not present

## 2021-03-21 DIAGNOSIS — H43812 Vitreous degeneration, left eye: Secondary | ICD-10-CM | POA: Diagnosis not present

## 2021-03-21 DIAGNOSIS — H35039 Hypertensive retinopathy, unspecified eye: Secondary | ICD-10-CM | POA: Diagnosis not present

## 2021-03-21 NOTE — Progress Notes (Signed)
Scheduled to complete physical 04/01/21 with Debroah Baller, FNP.  AMD

## 2021-03-24 ENCOUNTER — Other Ambulatory Visit: Payer: Self-pay

## 2021-03-24 ENCOUNTER — Ambulatory Visit: Payer: Self-pay

## 2021-03-24 DIAGNOSIS — Z0000 Encounter for general adult medical examination without abnormal findings: Secondary | ICD-10-CM

## 2021-03-24 LAB — POCT URINALYSIS DIPSTICK
Bilirubin, UA: NEGATIVE
Blood, UA: NEGATIVE
Glucose, UA: NEGATIVE
Ketones, UA: NEGATIVE
Leukocytes, UA: NEGATIVE
Nitrite, UA: NEGATIVE
Protein, UA: NEGATIVE
Spec Grav, UA: 1.015 (ref 1.010–1.025)
Urobilinogen, UA: 0.2 E.U./dL
pH, UA: 7 (ref 5.0–8.0)

## 2021-03-25 LAB — CMP12+LP+TP+TSH+6AC+PSA+CBC…
ALT: 33 IU/L (ref 0–44)
AST: 31 IU/L (ref 0–40)
Albumin/Globulin Ratio: 1.9 (ref 1.2–2.2)
Albumin: 4.6 g/dL (ref 3.8–4.8)
Alkaline Phosphatase: 74 IU/L (ref 44–121)
BUN/Creatinine Ratio: 17 (ref 10–24)
BUN: 15 mg/dL (ref 8–27)
Basophils Absolute: 0 10*3/uL (ref 0.0–0.2)
Basos: 0 %
Bilirubin Total: 0.4 mg/dL (ref 0.0–1.2)
Calcium: 9.2 mg/dL (ref 8.6–10.2)
Chloride: 103 mmol/L (ref 96–106)
Chol/HDL Ratio: 5.6 ratio — ABNORMAL HIGH (ref 0.0–5.0)
Cholesterol, Total: 209 mg/dL — ABNORMAL HIGH (ref 100–199)
Creatinine, Ser: 0.9 mg/dL (ref 0.76–1.27)
EOS (ABSOLUTE): 0.2 10*3/uL (ref 0.0–0.4)
Eos: 4 %
Estimated CHD Risk: 1.2 times avg. — ABNORMAL HIGH (ref 0.0–1.0)
Free Thyroxine Index: 1.5 (ref 1.2–4.9)
GGT: 51 IU/L (ref 0–65)
Globulin, Total: 2.4 g/dL (ref 1.5–4.5)
Glucose: 69 mg/dL (ref 65–99)
HDL: 37 mg/dL — ABNORMAL LOW (ref >39)
Hematocrit: 43.3 % (ref 37.5–51.0)
Hemoglobin: 14.8 g/dL (ref 13.0–17.7)
Immature Grans (Abs): 0 10*3/uL (ref 0.0–0.1)
Immature Granulocytes: 0 %
Iron: 136 ug/dL (ref 38–169)
LDH: 192 IU/L (ref 121–224)
LDL Chol Calc (NIH): 139 mg/dL — ABNORMAL HIGH (ref 0–99)
Lymphocytes Absolute: 1.8 10*3/uL (ref 0.7–3.1)
Lymphs: 34 %
MCH: 27.7 pg (ref 26.6–33.0)
MCHC: 34.2 g/dL (ref 31.5–35.7)
MCV: 81 fL (ref 79–97)
Monocytes Absolute: 0.8 10*3/uL (ref 0.1–0.9)
Monocytes: 15 %
Neutrophils Absolute: 2.4 10*3/uL (ref 1.4–7.0)
Neutrophils: 47 %
Phosphorus: 3 mg/dL (ref 2.8–4.1)
Platelets: 331 10*3/uL (ref 150–450)
Potassium: 4.3 mmol/L (ref 3.5–5.2)
Prostate Specific Ag, Serum: 5.6 ng/mL — ABNORMAL HIGH (ref 0.0–4.0)
RBC: 5.35 x10E6/uL (ref 4.14–5.80)
RDW: 13.7 % (ref 11.6–15.4)
Sodium: 141 mmol/L (ref 134–144)
T3 Uptake Ratio: 25 % (ref 24–39)
T4, Total: 6.1 ug/dL (ref 4.5–12.0)
TSH: 2.41 u[IU]/mL (ref 0.450–4.500)
Total Protein: 7 g/dL (ref 6.0–8.5)
Triglycerides: 181 mg/dL — ABNORMAL HIGH (ref 0–149)
Uric Acid: 6.3 mg/dL (ref 3.8–8.4)
VLDL Cholesterol Cal: 33 mg/dL (ref 5–40)
WBC: 5.3 10*3/uL (ref 3.4–10.8)
eGFR: 94 mL/min/{1.73_m2} (ref >59)

## 2021-04-01 ENCOUNTER — Other Ambulatory Visit: Payer: Self-pay

## 2021-04-01 ENCOUNTER — Encounter: Payer: Self-pay | Admitting: Nurse Practitioner

## 2021-04-01 ENCOUNTER — Ambulatory Visit: Payer: Self-pay | Admitting: Nurse Practitioner

## 2021-04-01 VITALS — BP 138/83 | HR 68 | Temp 97.6°F | Resp 14 | Ht 69.0 in | Wt 204.0 lb

## 2021-04-01 DIAGNOSIS — Z0000 Encounter for general adult medical examination without abnormal findings: Secondary | ICD-10-CM

## 2021-04-01 NOTE — Progress Notes (Signed)
Subjective:    Patient ID: Alexander Quinn, male    DOB: [birth year], 65 y.o.   MRN: [id number]  HPI Alexander Quinn is a 65 y.o. male who presents to the [facility] for his annual exam. He is employed in the [employer] and has been there for 4 years. He states he enjoys his job. He denies any problems today. He does report that he went to his PCP and there were EKG concerns so he was sent to a cardiologist for evaluation. After evaluation they increased his BP medication and told him no further follow up was needed.   Past Medical History:  Diagnosis Date  . Abnormal EKG 04/2020   cardiology work up increased bp medication dose and no other findings   Past Surgical History:  Procedure Laterality Date  . BACK SURGERY     65 years of age  . BIOPSY PROSTATE  03/2020   2nd Biopsy - Negative per patient  . CATARACT EXTRACTION, BILATERAL Bilateral 2000  . COLONOSCOPY  2020   2nd Colonoscopy - Performed in [place]  . LITHOTRIPSY  12/2019   Covid Screen prior to procedure & negative per patient  . MENISCUS REPAIR Left 2010  Immunizations: UTD, has had Covid and Flu vaccine Diet/Exercise: walk a lot on the job PCP: Dr. Lucianne Muss SH: no tobacco, ETOH or drug use  Current Outpatient Medications on File Prior to Visit  Medication Sig Dispense Refill  . albuterol (VENTOLIN HFA) 108 (90 Base) MCG/ACT inhaler INHALE 1 TO 2 PUFFS BY MOUTH EVERY 4 TO 6 HOURS AS NEEDED    . amLODipine (NORVASC) 5 MG tablet Take 1 tablet by mouth daily.    Marland Kitchen aspirin 325 MG EC tablet  0 Refill(s)    . CINNAMON PO 1,000 mg.    . Coenzyme Q10 100 MG capsule Take by mouth.    . cyanocobalamin 1000 MCG tablet Take by mouth.    . magnesium 30 MG tablet Take by mouth.    . pyridoxine (B-6) 100 MG tablet  Daily, 0 Refill(s)    . telmisartan (MICARDIS) 40 MG tablet 40 mg per 1 tablet, ORAL, Daily, 90 tablet, 3 Refill(s), Pharmacy: [id number], Height, 175.26, cm, 01/29/20 7:48:00 EDT,  Weight, 94.802, kg, 01/29/20 7:48:00 EDT    . ZINC SULFATE PO Take 1 tablet by mouth daily.     No current facility-administered medications on file prior to visit.   Allergies  Allergen Reactions  . Simvastatin-High Dose Other (See Comments)    Memory problems   Review of Systems  Musculoskeletal:       Occasional knee and ankle pain due to old injuries.   All other systems reviewed and are negative.      Objective: BP 138/83   Pulse 68   Temp 97.6 F (36.4 C)   Resp 14   Ht 5\' 9"  (1.753 m)   Wt 204 lb (92.5 kg)   SpO2 97%   BMI 30.13 kg/m     Physical Exam Vitals and nursing note reviewed.  Constitutional:      General: He is not in acute distress.    Appearance: Normal appearance. He is well-developed and well-groomed.  HENT:     Head: Normocephalic and atraumatic.     Jaw: There is normal jaw occlusion.     Right Ear: Tympanic membrane, ear canal and external ear normal.     Left Ear: Tympanic membrane, ear canal and external ear normal.  Nose: Nose normal.     Mouth/Throat:     Lips: Pink.     Mouth: Mucous membranes are moist.     Pharynx: Oropharynx is clear.  Eyes:     General: Lids are normal. No visual field deficit.    Extraocular Movements:     Right eye: Normal extraocular motion and no nystagmus.     Left eye: Normal extraocular motion and no nystagmus.     Conjunctiva/sclera: Conjunctivae normal.     Funduscopic exam:    Right eye: Red reflex present.        Left eye: Red reflex present.    Comments: Cataract surgery 2000, new lens and now has 2020 vision   Neck:     Thyroid: No thyroid mass or thyroid tenderness.     Vascular: Normal carotid pulses. No carotid bruit or JVD.     Trachea: Trachea normal.  Cardiovascular:     Rate and Rhythm: Normal rate and regular rhythm.     Pulses:          Carotid pulses are 2+ on the right side and 2+ on the left side.      Radial pulses are 2+ on the right side and 2+ on the left side.        Dorsalis pedis pulses are 2+ on the right side and 2+ on the left side.     Heart sounds: No murmur heard.   Pulmonary:     Effort: Pulmonary effort is normal.     Breath sounds: Normal breath sounds and air entry.  Abdominal:     General: Bowel sounds are normal. There is no distension.     Palpations: Abdomen is soft.     Tenderness: There is no abdominal tenderness. There is no right CVA tenderness or left CVA tenderness.  Musculoskeletal:     Cervical back: Normal range of motion. No spasms. No pain with movement, spinous process tenderness or muscular tenderness.     Lumbar back: Negative right straight leg raise test and negative left straight leg raise test.     Right lower leg: No edema.     Left lower leg: No edema.  Lymphadenopathy:     Cervical: No cervical adenopathy.  Skin:    General: Skin is warm and dry.  Neurological:     Mental Status: He is alert.     Cranial Nerves: No facial asymmetry.     Sensory: Sensation is intact.     Motor: No weakness.     Coordination: Romberg sign negative. Finger-Nose-Finger Test and Heel to Langlois Test normal.     Gait: Gait is intact.     Deep Tendon Reflexes:     Reflex Scores:      Bicep reflexes are 2+ on the right side and 2+ on the left side.      Brachioradialis reflexes are 2+ on the right side and 2+ on the left side.      Patellar reflexes are 2+ on the right side and 2+ on the left side.    Comments: Ambulatory with steady gait. Stands on one foot without difficulty. Grips are equal.   Psychiatric:        Attention and Perception: Attention normal.        Mood and Affect: Mood normal.        Speech: Speech normal.        Behavior: Behavior normal.       Assessment & Plan:  1.  Annual physical exam  Discussed with the patient clinical and lab findings and plan of care. Patient given the opportunity to ask questions. All questions fully answered and patient voices understanding and agrees with plan. Patient will plan  to RTC in one year or sooner if needed.

## 2021-04-01 NOTE — Patient Instructions (Signed)
Health Maintenance After Age 67 After age 64, you are at a higher risk for certain long-term diseases and infections as well as injuries from falls. Falls are a major cause of broken bones and head injuries in people who are older than age 41. Getting regular preventive care can help to keep you healthy and well. Preventive care includes getting regular testing and making lifestyle changes as recommended by your health care provider. Talk with your health care provider about:  Which screenings and tests you should have. A screening is a test that checks for a disease when you have no symptoms.  A diet and exercise plan that is right for you. What should I know about screenings and tests to prevent falls? Screening and testing are the best ways to find a health problem early. Early diagnosis and treatment give you the best chance of managing medical conditions that are common after age 19. Certain conditions and lifestyle choices may make you more likely to have a fall. Your health care provider may recommend:  Regular vision checks. Poor vision and conditions such as cataracts can make you more likely to have a fall. If you wear glasses, make sure to get your prescription updated if your vision changes.  Medicine review. Work with your health care provider to regularly review all of the medicines you are taking, including over-the-counter medicines. Ask your health care provider about any side effects that may make you more likely to have a fall. Tell your health care provider if any medicines that you take make you feel dizzy or sleepy.  Osteoporosis screening. Osteoporosis is a condition that causes the bones to get weaker. This can make the bones weak and cause them to break more easily.  Blood pressure screening. Blood pressure changes and medicines to control blood pressure can make you feel dizzy.  Strength and balance checks. Your health care provider may recommend certain tests to check your  strength and balance while standing, walking, or changing positions.  Foot health exam. Foot pain and numbness, as well as not wearing proper footwear, can make you more likely to have a fall.  Depression screening. You may be more likely to have a fall if you have a fear of falling, feel emotionally low, or feel unable to do activities that you used to do.  Alcohol use screening. Using too much alcohol can affect your balance and may make you more likely to have a fall. What actions can I take to lower my risk of falls? General instructions  Talk with your health care provider about your risks for falling. Tell your health care provider if: ? You fall. Be sure to tell your health care provider about all falls, even ones that seem minor. ? You feel dizzy, sleepy, or off-balance.  Take over-the-counter and prescription medicines only as told by your health care provider. These include any supplements.  Eat a healthy diet and maintain a healthy weight. A healthy diet includes low-fat dairy products, low-fat (lean) meats, and fiber from whole grains, beans, and lots of fruits and vegetables. Home safety  Remove any tripping hazards, such as rugs, cords, and clutter.  Install safety equipment such as grab bars in bathrooms and safety rails on stairs.  Keep rooms and walkways well-lit. Activity  Follow a regular exercise program to stay fit. This will help you maintain your balance. Ask your health care provider what types of exercise are appropriate for you.  If you need a cane or walker,  use it as recommended by your health care provider.  Wear supportive shoes that have nonskid soles.   Lifestyle  Do not drink alcohol if your health care provider tells you not to drink.  If you drink alcohol, limit how much you have: ? 0-1 drink a day for women. ? 0-2 drinks a day for men.  Be aware of how much alcohol is in your drink. In the [place] one drink equals one typical bottle of beer (12  oz), one-half glass of wine (5 oz), or one shot of hard liquor (1 oz).  Do not use any products that contain nicotine or tobacco, such as cigarettes and e-cigarettes. If you need help quitting, ask your health care provider. Summary  Having a healthy lifestyle and getting preventive care can help to protect your health and wellness after age 57.  Screening and testing are the best way to find a health problem early and help you avoid having a fall. Early diagnosis and treatment give you the best chance for managing medical conditions that are more common for people who are older than age 86.  Falls are a major cause of broken bones and head injuries in people who are older than age 85. Take precautions to prevent a fall at home.  Work with your health care provider to learn what changes you can make to improve your health and wellness and to prevent falls. This information is not intended to replace advice given to you by your health care provider. Make sure you discuss any questions you have with your health care provider. Document Revised: 02/16/2019 Document Reviewed: 09/08/2017 Elsevier Patient Education  2021 [employer].  Immunization Schedule, 65 Years and Older  Vaccines are usually given at various ages, according to a schedule. Your health care provider will recommend vaccines for you based on your age, medical history, and lifestyle or other factors such as travel or where you work. You may receive vaccines as individual doses or as more than one vaccine together in one shot (combination vaccines). Talk with your health care provider about the risks and benefits of combination vaccines. Recommended immunizations for 31 and older Influenza vaccine  You should get a dose of the influenza vaccine every year. Tetanus, diphtheria, and pertussis vaccine A vaccine that protects against tetanus, diphtheria, and pertussis is known as the Tdap vaccine. A vaccine that protects against  tetanus and diphtheria is known as the Td vaccine.  You should only get the Td vaccine if you have had at least 1 dose of the Tdap vaccine.  You should get 1 dose of the Td or Tdap vaccine every 10 years, or you should get 1 dose of the Tdap vaccine if: ? You have not previously gotten a Tdap vaccine. ? You do not know if you have ever gotten a Tdap vaccine. Zoster vaccine This is also known as the RZV vaccine. You should get 2 doses of the RZV vaccine 2 to 6 months apart. It is important to get the RZV vaccine even if you:  Have had shingles.  Have received the ZVL vaccine, an older version of the RZV vaccine.  Are unsure if you have had chickenpox (varicella). Pneumococcal polysaccharide vaccine This is also known as the PPSV23 vaccine. You should get 1 dose of the PPSV23 vaccine. Pneumococcal conjugate vaccine This is also known as the PCV13 vaccine. Talk to your health care provider about whether it is appropriate for you to get the PCV13 vaccine. Hepatitis A vaccine  This is also known as the HepA vaccine. If you did not get the HepA vaccine previously, you should get it if:  You are at risk for a hepatitis A infection. You may be at risk for infection if you: ? Have chronic liver disease. ? Have HIV or AIDS. ? Are a man who has sex with men. ? Use drugs. ? Are homeless. ? May be exposed to hepatitis A through work. ? Travel to countries where hepatitis A is common. ? Have or will have close contact with someone who was adopted from another country.  You are not at risk for infection but want protection from hepatitis A. Hepatitis B vaccine This is also known as the HepB vaccine. If you did not get the HepB vaccine previously, you should get it if:  You are at risk for hepatitis B infection. You are at risk if you: ? Have chronic liver disease. ? Have HIV or AIDS. ? Have sex with a partner who has hepatitis B, or:  You have multiple sex partners.  You are a man who has  sex with men. ? Use drugs. ? May be exposed to hepatitis B through work. ? Live with someone who has hepatitis B. ? Receive dialysis treatment. ? Have diabetes. ? Travel to countries where hepatitis B is common.  You are not at risk of infection but want protection from hepatitis B. Varicella vaccine This is also known as the VAR vaccine. You may need to get the VAR vaccine if you do not have evidence of immunity (by a blood test) and you may be exposed to varicella through work. This is especially important if you work in a health care setting. Meningococcal conjugate vaccine This is also known as the MenACWY vaccine. You may need to get the MenACWY vaccine if you:  Have not been given the vaccine before.  Need to catch up on doses you missed in the past. This vaccine is especially important if you:  Do not have a spleen.  Have sickle cell disease.  Have HIV.  Take medicines that suppress your body's disease-fighting system (immune system).  Travel to countries where meningococcal disease is common.  Are exposed to Neisseria meningitidis at work. Serogroup B meningococcal vaccine This is also known as the MenB vaccine. You may need to get the MenB vaccine if you:  Have not been given the vaccine before.  Need to catch up on doses you missed in the past. This vaccine is especially important if you:  Do not have a spleen.  Have sickle cell disease.  Take medicines that suppress your immune system.  Are exposed to Neisseria meningitidis at work. Haemophilus influenzae type b vaccine This is also known as the Hib vaccine. Anyone older than 67 years of age is usually not given the Hib vaccine. However, if you have certain high-risk conditions, you may need to get this vaccine. These conditions include:  Not having a spleen.  Having received a stem cell transplant. Before you get a vaccine: Talk with your health care provider about which vaccines are right for you. This  is especially important if:  You previously had a reaction after getting a vaccine.  You have a weakened immune system. You may have a weakened immune system if you: ? Are taking medicines that reduce (suppress) the activity of your immune system. ? Are taking medicines to treat cancer (chemotherapy). ? Have HIV or AIDS.  You work in an environment where you may be  exposed to a disease.  You plan to travel outside of the country.  You have a chronic illness, such as heart disease, kidney disease, diabetes, or lung disease. Summary  Before you get a vaccine, tell your health care provider if you have reacted to vaccines in the past or have a condition that weakens your immune system.  You should get a dose of the influenza vaccine every year and a dose of the Td or Tdap vaccine every 10 years.  You should get 2 doses of the RZV vaccine 2 to 6 months apart.  Depending on your medical history and your risk factors, you may need other vaccines. Ask your health care provider whether you are up to date on all your vaccines. This information is not intended to replace advice given to you by your health care provider. Make sure you discuss any questions you have with your health care provider. Document Revised: 08/22/2019 Document Reviewed: 08/22/2019 Elsevier Patient Education  [place].

## 2021-07-26 ENCOUNTER — Inpatient Hospital Stay (HOSPITAL_COMMUNITY)
Admission: EM | Admit: 2021-07-26 | Discharge: 2021-08-04 | DRG: 232 | Disposition: A | Discharge status: Home / Self Care | Payer: 59 | Attending: Thoracic Surgery (Cardiothoracic Vascular Surgery) | Admitting: Thoracic Surgery (Cardiothoracic Vascular Surgery)

## 2021-07-26 ENCOUNTER — Encounter (HOSPITAL_COMMUNITY): Payer: Self-pay

## 2021-07-26 DIAGNOSIS — Z09 Encounter for follow-up examination after completed treatment for conditions other than malignant neoplasm: Secondary | ICD-10-CM

## 2021-07-26 DIAGNOSIS — Z951 Presence of aortocoronary bypass graft: Secondary | ICD-10-CM

## 2021-07-26 DIAGNOSIS — R7989 Other specified abnormal findings of blood chemistry: Secondary | ICD-10-CM | POA: Diagnosis present

## 2021-07-26 DIAGNOSIS — Z6831 Body mass index (BMI) 31.0-31.9, adult: Secondary | ICD-10-CM

## 2021-07-26 DIAGNOSIS — Z888 Allergy status to other drugs, medicaments and biological substances status: Secondary | ICD-10-CM

## 2021-07-26 DIAGNOSIS — I25110 Atherosclerotic heart disease of native coronary artery with unstable angina pectoris: Secondary | ICD-10-CM | POA: Diagnosis present

## 2021-07-26 DIAGNOSIS — Z6829 Body mass index (BMI) 29.0-29.9, adult: Secondary | ICD-10-CM

## 2021-07-26 DIAGNOSIS — E8770 Fluid overload, unspecified: Secondary | ICD-10-CM | POA: Diagnosis not present

## 2021-07-26 DIAGNOSIS — I214 Non-ST elevation (NSTEMI) myocardial infarction: Secondary | ICD-10-CM | POA: Diagnosis not present

## 2021-07-26 DIAGNOSIS — R351 Nocturia: Secondary | ICD-10-CM | POA: Diagnosis present

## 2021-07-26 DIAGNOSIS — I499 Cardiac arrhythmia, unspecified: Secondary | ICD-10-CM | POA: Diagnosis not present

## 2021-07-26 DIAGNOSIS — R42 Dizziness and giddiness: Secondary | ICD-10-CM | POA: Diagnosis not present

## 2021-07-26 DIAGNOSIS — Z87891 Personal history of nicotine dependence: Secondary | ICD-10-CM

## 2021-07-26 DIAGNOSIS — Z85828 Personal history of other malignant neoplasm of skin: Secondary | ICD-10-CM

## 2021-07-26 DIAGNOSIS — R0902 Hypoxemia: Secondary | ICD-10-CM | POA: Diagnosis not present

## 2021-07-26 DIAGNOSIS — I480 Paroxysmal atrial fibrillation: Secondary | ICD-10-CM

## 2021-07-26 DIAGNOSIS — E782 Mixed hyperlipidemia: Secondary | ICD-10-CM | POA: Diagnosis present

## 2021-07-26 DIAGNOSIS — R4020 Unspecified coma: Secondary | ICD-10-CM | POA: Diagnosis not present

## 2021-07-26 DIAGNOSIS — J9811 Atelectasis: Secondary | ICD-10-CM | POA: Diagnosis not present

## 2021-07-26 DIAGNOSIS — D72829 Elevated white blood cell count, unspecified: Secondary | ICD-10-CM | POA: Diagnosis not present

## 2021-07-26 DIAGNOSIS — E669 Obesity, unspecified: Secondary | ICD-10-CM | POA: Diagnosis present

## 2021-07-26 DIAGNOSIS — R55 Syncope and collapse: Secondary | ICD-10-CM | POA: Diagnosis present

## 2021-07-26 DIAGNOSIS — N401 Enlarged prostate with lower urinary tract symptoms: Secondary | ICD-10-CM | POA: Diagnosis present

## 2021-07-26 DIAGNOSIS — E538 Deficiency of other specified B group vitamins: Secondary | ICD-10-CM | POA: Diagnosis present

## 2021-07-26 DIAGNOSIS — I252 Old myocardial infarction: Secondary | ICD-10-CM

## 2021-07-26 DIAGNOSIS — Z20822 Contact with and (suspected) exposure to covid-19: Secondary | ICD-10-CM | POA: Diagnosis present

## 2021-07-26 DIAGNOSIS — I10 Essential (primary) hypertension: Secondary | ICD-10-CM | POA: Diagnosis present

## 2021-07-26 DIAGNOSIS — Z7982 Long term (current) use of aspirin: Secondary | ICD-10-CM

## 2021-07-26 DIAGNOSIS — E785 Hyperlipidemia, unspecified: Secondary | ICD-10-CM | POA: Diagnosis not present

## 2021-07-26 DIAGNOSIS — R778 Other specified abnormalities of plasma proteins: Secondary | ICD-10-CM | POA: Diagnosis present

## 2021-07-26 DIAGNOSIS — Z811 Family history of alcohol abuse and dependence: Secondary | ICD-10-CM

## 2021-07-26 DIAGNOSIS — Z809 Family history of malignant neoplasm, unspecified: Secondary | ICD-10-CM

## 2021-07-26 DIAGNOSIS — I440 Atrioventricular block, first degree: Secondary | ICD-10-CM | POA: Diagnosis present

## 2021-07-26 DIAGNOSIS — J90 Pleural effusion, not elsewhere classified: Secondary | ICD-10-CM

## 2021-07-26 DIAGNOSIS — D62 Acute posthemorrhagic anemia: Secondary | ICD-10-CM | POA: Diagnosis not present

## 2021-07-26 HISTORY — DX: Squamous cell carcinoma of skin of scalp and neck: C44.42

## 2021-07-26 HISTORY — DX: Essential (primary) hypertension: I10

## 2021-07-26 HISTORY — DX: Mixed hyperlipidemia: E78.2

## 2021-07-26 HISTORY — DX: Dorsalgia, unspecified: M54.9

## 2021-07-26 LAB — CBC
HCT: 46.9 % (ref 39.0–52.0)
Hemoglobin: 15.3 g/dL (ref 13.0–17.0)
MCH: 27.6 pg (ref 26.0–34.0)
MCHC: 32.6 g/dL (ref 30.0–36.0)
MCV: 84.5 fL (ref 80.0–100.0)
Platelets: 321 10*3/uL (ref 150–400)
RBC: 5.55 MIL/uL (ref 4.22–5.81)
RDW: 13.6 % (ref 11.5–15.5)
WBC: 10.6 10*3/uL — ABNORMAL HIGH (ref 4.0–10.5)
nRBC: 0 % (ref 0.0–0.2)

## 2021-07-26 LAB — RESP PANEL BY RT-PCR (FLU A&B, COVID) ARPGX2
Influenza A by PCR: NEGATIVE
Influenza B by PCR: NEGATIVE
SARS Coronavirus 2 by RT PCR: NEGATIVE

## 2021-07-26 LAB — BASIC METABOLIC PANEL
Anion gap: 6 (ref 5–15)
BUN: 11 mg/dL (ref 8–23)
CO2: 25 mmol/L (ref 22–32)
Calcium: 9.4 mg/dL (ref 8.9–10.3)
Chloride: 107 mmol/L (ref 98–111)
Creatinine, Ser: 1.16 mg/dL (ref 0.61–1.24)
GFR, Estimated: >60 mL/min (ref >60)
Glucose, Bld: 107 mg/dL — ABNORMAL HIGH (ref 70–99)
Potassium: 4.4 mmol/L (ref 3.5–5.1)
Sodium: 138 mmol/L (ref 135–145)

## 2021-07-26 LAB — TROPONIN I (HIGH SENSITIVITY)
Troponin I (High Sensitivity): 44 ng/L — ABNORMAL HIGH (ref <18)
Troponin I (High Sensitivity): 55 ng/L — ABNORMAL HIGH (ref <18)
Troponin I (High Sensitivity): 64 ng/L — ABNORMAL HIGH (ref <18)
Troponin I (High Sensitivity): 66 ng/L — ABNORMAL HIGH (ref <18)

## 2021-07-26 MED ORDER — ASPIRIN 81 MG PO CHEW
324.0000 mg | CHEWABLE_TABLET | Freq: Once | ORAL | Status: AC
  Administered 2021-07-26: 324 mg via ORAL
  Filled 2021-07-26: qty 4

## 2021-07-26 MED ORDER — VITAMIN D 25 MCG (1000 UNIT) PO TABS
1000.0000 [IU] | ORAL_TABLET | Freq: Every day | ORAL | Status: DC
  Administered 2021-07-27 – 2021-07-30 (×4): 1000 [IU] via ORAL
  Filled 2021-07-26 (×5): qty 1

## 2021-07-26 MED ORDER — ENOXAPARIN SODIUM 40 MG/0.4ML IJ SOSY
40.0000 mg | PREFILLED_SYRINGE | INTRAMUSCULAR | Status: DC
  Administered 2021-07-26: 40 mg via SUBCUTANEOUS
  Filled 2021-07-26: qty 0.4

## 2021-07-26 MED ORDER — IRBESARTAN 300 MG PO TABS
150.0000 mg | ORAL_TABLET | Freq: Every day | ORAL | Status: DC
  Administered 2021-07-26 – 2021-07-30 (×5): 150 mg via ORAL
  Filled 2021-07-26 (×5): qty 1

## 2021-07-26 MED ORDER — VITAMIN B-12 1000 MCG PO TABS
1000.0000 ug | ORAL_TABLET | Freq: Every day | ORAL | Status: DC
  Administered 2021-07-27 – 2021-08-04 (×8): 1000 ug via ORAL
  Filled 2021-07-26 (×8): qty 1

## 2021-07-26 MED ORDER — ASPIRIN EC 81 MG PO TBEC
81.0000 mg | DELAYED_RELEASE_TABLET | Freq: Every day | ORAL | Status: DC
  Administered 2021-07-27 – 2021-07-30 (×3): 81 mg via ORAL
  Filled 2021-07-26 (×3): qty 1

## 2021-07-26 MED ORDER — SODIUM CHLORIDE 0.9% FLUSH
3.0000 mL | INTRAVENOUS | Status: DC | PRN
Start: 2021-07-26 — End: 2021-07-27

## 2021-07-26 MED ORDER — SODIUM CHLORIDE 0.9% FLUSH
3.0000 mL | Freq: Two times a day (BID) | INTRAVENOUS | Status: DC
  Administered 2021-07-26 (×2): 3 mL via INTRAVENOUS

## 2021-07-26 MED ORDER — ONDANSETRON HCL 4 MG/2ML IJ SOLN: 4.0000 mg | Freq: Four times a day (QID) | INTRAMUSCULAR | Status: DC | PRN

## 2021-07-26 MED ORDER — NITROGLYCERIN 0.4 MG SL SUBL
0.4000 mg | SUBLINGUAL_TABLET | SUBLINGUAL | Status: DC | PRN
  Administered 2021-07-29 (×3): 0.4 mg via SUBLINGUAL
  Filled 2021-07-26: qty 1

## 2021-07-26 MED ORDER — ACETAMINOPHEN 325 MG PO TABS: 650.0000 mg | ORAL_TABLET | ORAL | Status: DC | PRN

## 2021-07-26 MED ORDER — MAGNESIUM OXIDE -MG SUPPLEMENT 400 (240 MG) MG PO TABS
200.0000 mg | ORAL_TABLET | Freq: Every day | ORAL | Status: DC
  Administered 2021-07-27 – 2021-07-30 (×4): 200 mg via ORAL
  Filled 2021-07-26 (×4): qty 1

## 2021-07-26 MED ORDER — SODIUM CHLORIDE 0.9 % IV SOLN: 250.0000 mL | INTRAVENOUS | Status: DC | PRN

## 2021-07-26 MED ORDER — HEPARIN (PORCINE) 25000 UT/250ML-% IV SOLN
1850.0000 [IU]/h | INTRAVENOUS | Status: DC
  Administered 2021-07-26: 1250 [IU]/h via INTRAVENOUS
  Filled 2021-07-26 (×2): qty 250

## 2021-07-26 NOTE — ED Notes (Signed)
ED Provider at bedside. 

## 2021-07-26 NOTE — H&P (Signed)
History and Physical    Alexander Quinn [id number] DOB: [birth year] DOA: 07/26/2021  PCP: Eden Lathe, MD Consultants:  Dr. Jonetta Speak: urologist at [facility]. Was seeing cardiology: Dr. Gerarda Gunther, but has not been back.  Patient coming from:  Home - lives with wife sharen and her daughter   Chief Complaint: syncope  HPI: Alexander Quinn is a 64 y.o. male with medical history significant of HTN, HLD, BPH and obesity presenting to ED with syncopal event.  He was playing basketball at the [employer] around 7:00AM. After 30 minutes he needed to sit down because he was breathing hard. He realized he was breathing harder than he thought and was sitting in a chair and blacked out. He states he just passed out in chair, no fall to ground and did not hit head. When he opened his eyes a guy was standing next to him and told him he had passed out in his chair. They called the ambulance. He thinks he was out for less than a minute.  He had no shaking, biting tongue or urinary incontinence. He had no confusion when he came to. He had no chest pain or palpitations while exercising or prior to event.   He had no breakfast this AM and drank about 3-4 cups of coffee before going to work out, he typically always eats breakfast. He did give platelets on Wednesday which was his first time. Regularly donates blood.   He has no family history of heart disease in his family. He does not smoke, remote history. Stopped over 42 years ago.   He denies any fever/chills, headaches, vision changes, shortness of breath, abdominal pain, N/V/D, leg swelling. No dysuria. Overall feels good.    ED Course: vitals: Afebrile, blood pressure 166/90, heart rate 65, respiratory rate 16, oxygen 96% on room air. Pertinent labs: WBC 10.6, troponin 44---> delta pending, chest x-ray within normal limits.  EKG with normal sinus rhythm at a rate of 74 no ST wave abnormalities.  Cardiology consulted in ED.  Patient given high-dose aspirin and  we were called and asked to admit.  Review of Systems: As per HPI; otherwise review of systems reviewed and negative.   Ambulatory Status:  Ambulates without assistance   Past Medical History:  Diagnosis Date   Back pain    Hypertension    Mixed hyperlipidemia    Squamous cell carcinoma of scalp     Past Surgical History:  Procedure Laterality Date   BACK SURGERY     65 years of age   BIOPSY PROSTATE  03/2020   2nd Biopsy - Negative per patient   CATARACT EXTRACTION, BILATERAL Bilateral 2000   COLONOSCOPY  2020   2nd Colonoscopy - Performed in [place]   LITHOTRIPSY  12/2019   Covid Screen prior to procedure & negative per patient   MENISCUS REPAIR Left 2010    Social History   Socioeconomic History   Marital status: Married    Spouse name: Not on file   Number of children: Not on file   Years of education: Not on file   Highest education level: Not on file  Occupational History   Not on file  Tobacco Use   Smoking status: Former   Smokeless tobacco: Never  Substance and Sexual Activity   Alcohol use: Never   Drug use: Never   Sexual activity: Not on file  Other Topics Concern   Not on file  Social History Narrative   Not on file  Social Determinants of Health   Financial Resource Strain: Not on file  Food Insecurity: Not on file  Transportation Needs: Not on file  Physical Activity: Not on file  Stress: Not on file  Social Connections: Not on file  Intimate Partner Violence: Not on file    Allergies  Allergen Reactions   Simvastatin-High Dose Other (See Comments)    Memory problems    Family History  Problem Relation Age of Onset   Cancer Mother    Alcohol abuse Mother    Alcohol abuse Father    Cancer Father    Aneurysm Maternal Grandfather     Prior to Admission medications   Medication Sig Start Date End Date Taking? Authorizing Provider  albuterol (VENTOLIN HFA) 108 (90 Base) MCG/ACT inhaler Inhale 1-2 puffs into the lungs See  admin instructions. Inhale 1-2 puffs into the lungs every four to six hours as needed for shortness of breath or wheezing 12/16/16  Yes [provider]  aspirin 325 MG EC tablet Take 325 mg by mouth daily as needed (for back pain or headaches). 05/04/19  Yes [provider]  Cholecalciferol (VITAMIN D-3) 25 MCG (1000 UT) CAPS Take 1,000 Units by mouth daily.   Yes [provider]  CINNAMON PO Take 1,000 mg by mouth daily. 05/04/19  Yes [provider]  Coenzyme Q10 100 MG capsule Take 100 mg by mouth daily.   Yes [provider]  cyanocobalamin 1000 MCG tablet Take by mouth.   Yes [provider]  magnesium 30 MG tablet Take 30 mg by mouth daily.   Yes [provider]  POTASSIUM PO Take 1 tablet by mouth daily.   Yes [provider]  pyridoxine (B-6) 100 MG tablet Take 100 mg by mouth daily. 05/04/19  Yes [provider]  telmisartan (MICARDIS) 40 MG tablet Take 40 mg by mouth in the morning. 01/29/20  Yes [provider]  ZINC SULFATE PO Take 1 tablet by mouth daily.   Yes [provider]    Physical Exam: Vitals:   07/26/21 1128 07/26/21 1130 07/26/21 1150 07/26/21 1230  BP: (!) 191/94 (!) 182/83 (!) 183/90 (!) 175/95  Pulse: (!) 59 (!) 51 (!) 46 (!) 54  Resp: (!) '22 13 10 13  '$ Temp:      TempSrc:      SpO2: 96% 98% 98% 98%  Weight:      Height:         General:  Appears calm and comfortable and is in NAD Eyes:  PERRL, EOMI, normal lids, iris ENT:  grossly normal hearing, lips & tongue, mmm; appropriate dentition Neck:  no LAD, masses or thyromegaly; no carotid bruits Cardiovascular:  RRR, no m/r/g. No LE edema.  Respiratory:   CTA bilaterally with no wheezes/rales/rhonchi.  Normal respiratory effort. Abdomen:  soft, NT, ND, NABS Back:   normal alignment, no CVAT Skin:  no rash or induration seen on limited exam Musculoskeletal:  grossly normal tone BUE/BLE, good ROM, no bony  abnormality Lower extremity:  No LE edema.  Limited foot exam with no ulcerations.  2+ distal pulses. Psychiatric:  grossly normal mood and affect, speech fluent and appropriate, AOx3 Neurologic:  CN 2-12 grossly intact, moves all extremities in coordinated fashion, sensation intact    Radiological Exams on Admission: Independently reviewed - see discussion in A/P where applicable  No results found.  EKG: Independently reviewed.  NSR with rate 74; nonspecific ST changes with no evidence of acute ischemia  Labs on Admission: I have personally reviewed the available labs and imaging studies at the time of the admission.  Pertinent labs:  WBC 10.6,  troponin 44---> delta pending,     Assessment/Plan Principal Problem:  Syncope 65 year old male with multiple risk factors for CAD including hypertension hyperlipidemia and obesity who presented after syncopal episode after exercise with associated dyspnea found to have elevated troponin -Placed on telemetry -Echocardiogram pending -Cardiology following see below  Active Problems:   Elevated troponin/NSTEMI -multiple risk factors for CAD including labile HTN, HLD and obesity. History of chest pain/ACS type symptoms in the past with stress test by his old cardiologist in 2021. -stress test (treadmill cardiolite) 06/19/20 showed mild to moderate lateral wall ischemia. Heart cath was discussed, but he wanted a second opinion and has not followed back up with his cardiologist.  -Start aspirin 81 mg daily -fasting lipid panel ordered -cardiology following with plans for diagnostic heart cath on Monday -will start heparin gtt if troponin trends upward, continue to cycle.  -he has not endorsed any chest pain.   Leukocytosis -mildly elevated, likely reactive -no signs of infection -continue to monitor    HTN (hypertension) -appears accelerated. Has not had his home medication today. Was just given his dose of medication (substitute for  his micardis), also endorses white coat HTN.  -reading cardiology notes he has not been controlled and was on norvasc in 2021, no longer on this.  -will monitor and may need adjustment of medication  -continue magnesium supplement  -avoid AV nodal blocking agents with his bradycardia.     Hyperlipidemia  Adverse reactions to statins. Has tried simvastatin and he can't recall the other one. Doesn't believe he has been on crestor. He states he had memory loss and other AE and isn't keen to start on this.  -fasting lipid panel pending   Body mass index is 29.53 kg/m.   Level of care: Telemetry Cardiac DVT prophylaxis:  Lovenox Code Status:  Full - confirmed with patient Family Communication: wife at bedside, Charity fundraiser  Disposition Plan:  The patient is from: home  Anticipated d/c is to: home   Requires inpatient hospitalization and is at significant risk of worsening, requires constant monitoring, assessment and MDM with specialists.   Consults called: cardiology by edp  Admission status:  observation   Dragon dictation used in completing this note.    Orma Flaming MD [facility]   How to contact the [facility] Attending or Consulting provider [place] or covering provider during after hours [place], for this patient?  Check the care team in [facility] and look for a) attending/consulting [facility] provider listed and b) the [facility] team listed Log into [email or web address] and use [place]'s universal password to access. If you do not have the password, please contact the hospital operator. Locate the [facility] provider you are looking for under [facility] and page to a number that you can be directly reached. If you still have difficulty reaching the provider, please page the [facility] (Director on Call) for the Hospitalists listed on amion for assistance.   07/26/2021, 2:24 PM

## 2021-07-26 NOTE — ED Triage Notes (Signed)
Playing basketball at [facility], ? Over exertion, felt winded, sat down in chair and was found passed out, no injuries, A/O upon medic arrival, no stroke deficits, HR 50"'s intermittently which patient reports as normal.

## 2021-07-26 NOTE — ED Provider Notes (Addendum)
[facility] EMERGENCY DEPARTMENT Provider Note   CSN: [id number] Arrival date & time: 07/26/21  0815     History Chief Complaint  Patient presents with   Loss of Consciousness    Alexander Quinn is a 65 y.o. male.   Loss of Consciousness   Pt had not eaten anything for breakfast this morning.  He had a few cups of coffee to drink.  He went to go play basketball and was exerting himself vigorously.  He  started to feel lightheaded so he sat down.  He then had a syncopal episode.  Maybe a minute or less.  Pt remembers sitting in the chair and someone checking on him.  No CP, he did feel very lightheaded and flushed.  He was breathing hard at the time from exertion trying to catch his breath. Right now he feels fine.  Past Medical History:  Diagnosis Date   Abnormal EKG 04/2020   cardiology work up increased bp medication dose and no other findings   Hypertension     Patient Active Problem List   Diagnosis Date Noted   HTN (hypertension) 05/09/2020   Hyperlipidemia 05/09/2020    Past Surgical History:  Procedure Laterality Date   BACK SURGERY     65 years of age   BIOPSY PROSTATE  03/2020   2nd Biopsy - Negative per patient   CATARACT EXTRACTION, BILATERAL Bilateral 2000   COLONOSCOPY  2020   [facility]   LITHOTRIPSY  12/2019   Covid Screen prior to procedure & negative per patient   MENISCUS REPAIR Left 2010       No family history on file.  Social History   Tobacco Use   Smoking status: Former   Smokeless tobacco: Never  Substance Use Topics   Alcohol use: Never   Drug use: Never    Home Medications Prior to Admission medications   Medication Sig Start Date End Date Taking? Authorizing Provider  albuterol (VENTOLIN HFA) 108 (90 Base) MCG/ACT inhaler INHALE 1 TO 2 PUFFS BY MOUTH EVERY 4 TO 6 HOURS AS NEEDED 12/16/16   [provider]  amLODipine (NORVASC) 5 MG tablet Take 1 tablet by mouth  daily. 12/31/20   [provider]  aspirin 325 MG EC tablet  0 Refill(s) 05/04/19   [provider]  CINNAMON PO 1,000 mg. 05/04/19   [provider]  Coenzyme Q10 100 MG capsule Take by mouth.    [provider]  cyanocobalamin 1000 MCG tablet Take by mouth.    [provider]  magnesium 30 MG tablet Take by mouth.    [provider]  pyridoxine (B-6) 100 MG tablet  Daily, 0 Refill(s) 05/04/19   [provider]  telmisartan (MICARDIS) 40 MG tablet 40 mg per 1 tablet, ORAL, Daily, 90 tablet, 3 Refill(s), Pharmacy: [facility] #[id number] Height, 175.26, cm, 01/29/20 7:48:00 EDT, Weight, 94.802, kg, 01/29/20 7:48:00 EDT 01/29/20   [provider]  ZINC SULFATE PO Take 1 tablet by mouth daily.    [provider]    Allergies    Simvastatin-high dose  Review of Systems   Review of Systems  Cardiovascular:  Positive for syncope.  All other systems reviewed and are negative.  Physical Exam Updated Vital Signs Ht 1.753 m ('5\' 9"'$ )   Wt 90.7 kg   SpO2 98%   BMI 29.53 kg/m   Physical Exam Vitals and nursing note reviewed.  Constitutional:  General: He is not in acute distress.    Appearance: He is well-developed.  HENT:     Head: Normocephalic and atraumatic.     Right Ear: External ear normal.     Left Ear: External ear normal.  Eyes:     General: No scleral icterus.       Right eye: No discharge.        Left eye: No discharge.     Conjunctiva/sclera: Conjunctivae normal.  Neck:     Trachea: No tracheal deviation.  Cardiovascular:     Rate and Rhythm: Normal rate and regular rhythm.  Pulmonary:     Effort: Pulmonary effort is normal. No respiratory distress.     Breath sounds: Normal breath sounds. No stridor. No wheezing or rales.  Abdominal:     General: Bowel sounds are normal. There is no distension.     Palpations: Abdomen is soft.     Tenderness: There is no abdominal tenderness. There is no  guarding or rebound.  Musculoskeletal:        General: No tenderness or deformity.     Cervical back: Neck supple.  Skin:    General: Skin is warm and dry.     Findings: No rash.  Neurological:     General: No focal deficit present.     Mental Status: He is alert and oriented to person, place, and time.     Cranial Nerves: No cranial nerve deficit (no facial droop, extraocular movements intact, no slurred speech).     Sensory: No sensory deficit.     Motor: No abnormal muscle tone or seizure activity.     Coordination: Coordination normal.     Comments: Normal strength and sensation bilateral upper extremities  Psychiatric:        Mood and Affect: Mood normal.    ED Results / Procedures / Treatments   Labs (all labs ordered are listed, but only abnormal results are displayed) Labs Reviewed - No data to display  EKG EKG Interpretation  Date/Time:  Saturday July 26 2021 08:37:15 EDT Ventricular Rate:  74 PR Interval:  198 QRS Duration: 82 QT Interval:  389 QTC Calculation: 432 R Axis:   41 Text Interpretation: Sinus rhythm Low voltage, precordial leads Consider anterior infarct Confirmed by Dorie Rank [phone]) on 07/26/2021 8:45:17 AM  Radiology No results found.  Procedures Procedures   Medications Ordered in ED Medications - No data to display  ED Course  I have reviewed the triage vital signs and the nursing notes.  Elevated BP noted  Pertinent labs & imaging results that were available during my care of the patient were reviewed by me and considered in my medical decision making (see chart for details).  Clinical Course as of 07/26/21 1304  Sat Jul 26, 2021  1248 Patient's initial troponin is elevated at 44..  CBC metabolic panel otherwise unremarkable. [JK]  1248 We will add on delta troponin.  Patient does been chest pain-free [JK]  1300 D/w Dr Conni Elliot.  Will see in consultation.  Plan  [JK]    Clinical Course User Index [JK] Dorie Rank, MD   MDM  Rules/Calculators/A&P                           Patient presented to the ED for evaluation after syncopal episode.  Patient is asymptomatic right now.  Patient is not having any chest pain or shortness of breath.  No signs of stroke on  exam.  Discussed evaluating the patient with laboratory tests and a period of cardiac monitoring in the ED.  Possible the patient's syncopal episode was related to overexertion and hyperventilation.  However it is also concerning that he could have had some type of cardiac dysrhythmia or acute cardiac event brought on by his physical exertion.  Patient does not want to be in the ED.  He did not really feel like he needed to come here.  The people at the [facility] convinced him to be evaluated.  Patient would like to leave at this point without any evaluation.  I explained to him that it is possible a serious cardiac etiology could have caused his fainting episode.  He understands that and wants to leave.  He did have a screening EKG but no further testing.  Patient states he will follow-up with his primary care doctor.  He understands he can return to the ED at any time.  Patient initially decided he was going to leave.  He ended up agreeing to stay for further evaluation.  Patient's laboratory tests are notable for elevated troponin.  He remains without any chest pain.  I am concerned about the possibility of cardiac ischemic event precipitating his syncopal episode.  Discussed the case with Dr. Domenic Polite cardiology.  They will consult on patient.  Recommend echo and serial troponins.  I will consult the medical service for admission.  Findings and plan were discussed with the patient. Final Clinical Impression(s) / ED Diagnoses Final diagnoses:  Syncope and collapse    Rx / DC Orders ED Discharge Orders     None        Dorie Rank, MD 07/26/21 [id number]     Dorie Rank, MD 07/26/21 [phone]

## 2021-07-26 NOTE — Discharge Instructions (Addendum)

## 2021-07-26 NOTE — Consult Note (Addendum)
Cardiology Consultation:   Patient ID: Alexander Quinn MRN: [id number]; DOB: 1956/08/25  Admit date: 07/26/2021 Date of Consult: 07/26/2021  PCP:  Eden Lathe, MD   Eden Providers Cardiologist: Previously evaluated by Dr. Gerarda Gunther [facility]) in 2021  Patient Profile:   Alexander Quinn is a 65 y.o. male with a past medical history of HTN, HLD and obesity who is being seen 07/26/2021 for the evaluation of syncope and elevated troponin values at the request of Dr. Tomi Bamberger.  History of Present Illness:   Mr. Goatley was previously followed by [facility] by review of Care Everywhere and underwent stress testing in 06/2020 which showed mild to moderate lateral wall ischemia. By review of notes, the patient wanted to have a second opinion before proceeding with a catheterization and he has not been evaluated by Cardiology since.   He presented to [facility] ED this morning for evaluation of a syncopal episode which occurred while exerting himself. In talking with the patient today, he reports being physically active at baseline as he has a garden and also enjoys playing basketball at the [facility]. This morning, he initially felt in his normal state of health while playing basketball but started to develop worsening dyspnea and associated dizziness. He walked over to a chair and says he "slumped over". Someone who works at [employer] came to him and he quickly came back to consciousness.  Denies any postictal symptoms. Denies any associated chest pain or palpitations at that time. Says that he feels back to baseline currently.  He is a former smoker but quit 40+ years ago. Denies any known family history of CAD. Has known hypertension and reports good compliance with his medications in regards to this. Says that his cholesterol has been "mildly elevated" but he has not required medical therapy.  Initial labs show WBC 10.6, Hgb 15.3, platelets 321, Na+ 138, K+ 4.4 and creatinine  1.16.Initial Hs Troponin 44 with repeat values pending. EKG shows NSR, HR 74 with no acute ST changes.    Past Medical History:  Diagnosis Date   Back pain    Hypertension    Mixed hyperlipidemia    Squamous cell carcinoma of scalp     Past Surgical History:  Procedure Laterality Date   BACK SURGERY     65 years of age   BIOPSY PROSTATE  03/2020   2nd Biopsy - Negative per patient   CATARACT EXTRACTION, BILATERAL Bilateral 2000   COLONOSCOPY  2020   2nd Colonoscopy - Performed in [place]   LITHOTRIPSY  12/2019   Covid Screen prior to procedure & negative per patient   MENISCUS REPAIR Left 2010     Home Medications:  Prior to Admission medications   Medication Sig Start Date End Date Taking? Authorizing Provider  albuterol (VENTOLIN HFA) 108 (90 Base) MCG/ACT inhaler INHALE 1 TO 2 PUFFS BY MOUTH EVERY 4 TO 6 HOURS AS NEEDED 12/16/16  Yes [provider]  aspirin 325 MG EC tablet Take 325 mg by mouth daily as needed (for back pain or headaches). 05/04/19  Yes [provider]  Cholecalciferol (VITAMIN D-3) 25 MCG (1000 UT) CAPS Take by mouth.   Yes [provider]  CINNAMON PO Take 1,000 mg by mouth daily. 05/04/19  Yes [provider]  Coenzyme Q10 100 MG capsule Take by mouth.   Yes [provider]  cyanocobalamin 1000 MCG tablet Take by mouth.   Yes [provider]  magnesium 30 MG tablet  Take by mouth.   Yes [provider]  POTASSIUM PO Take 1 tablet by mouth daily.   Yes [provider]  pyridoxine (B-6) 100 MG tablet  Daily, 0 Refill(s) 05/04/19  Yes [provider]  telmisartan (MICARDIS) 40 MG tablet Take 40 mg by mouth in the morning. 01/29/20  Yes [provider]  ZINC SULFATE PO Take 1 tablet by mouth daily.   Yes [provider]    Inpatient Medications: Scheduled Meds:  Continuous Infusions:  PRN Meds:   Allergies:    Allergies  Allergen Reactions    Simvastatin-High Dose Other (See Comments)    Memory problems    Social History:   Social History   Socioeconomic History   Marital status: Married    Spouse name: Not on file   Number of children: Not on file   Years of education: Not on file   Highest education level: Not on file  Occupational History   Not on file  Tobacco Use   Smoking status: Former   Smokeless tobacco: Never  Substance and Sexual Activity   Alcohol use: Never   Drug use: Never   Sexual activity: Not on file  Other Topics Concern   Not on file  Social History Narrative   Not on file   Social Determinants of Health   Financial Resource Strain: Not on file  Food Insecurity: Not on file  Transportation Needs: Not on file  Physical Activity: Not on file  Stress: Not on file  Social Connections: Not on file  Intimate Partner Violence: Not on file    Family History:    Family History  Problem Relation Age of Onset   Cancer Mother    Alcohol abuse Mother    Alcohol abuse Father    Cancer Father    Aneurysm Maternal Grandfather      ROS:  Please see the history of present illness.   All other ROS reviewed and negative.     Physical Exam/Data:   Vitals:   07/26/21 1128 07/26/21 1130 07/26/21 1150 07/26/21 1230  BP: (!) 191/94 (!) 182/83 (!) 183/90 (!) 175/95  Pulse: (!) 59 (!) 51 (!) 46 (!) 54  Resp: (!) '22 13 10 13  '$ Temp:      TempSrc:      SpO2: 96% 98% 98% 98%  Weight:      Height:       No intake or output data in the 24 hours ending 07/26/21 1409 Last 3 Weights 07/26/2021 04/01/2021 07/29/2018  Weight (lbs) 200 lb 204 lb 205 lb  Weight (kg) 90.719 kg 92.534 kg 92.987 kg     Body mass index is 29.53 kg/m.  General:  Well nourished, well developed male appearing in no acute distress. HEENT: normal Neck: no JVD Vascular: No carotid bruits; Distal pulses 2+ bilaterally Cardiac:  normal S1, S2; RRR; no murmur. Lungs:  clear to auscultation bilaterally, no wheezing, rhonchi or  rales  Abd: soft, nontender, no hepatomegaly  Ext: no pitting edema Musculoskeletal:  No deformities, BUE and BLE strength normal and equal Skin: warm and dry  Neuro:  CNs 2-12 intact, no focal abnormalities noted Psych:  Normal affect   EKG:  The EKG was personally reviewed and demonstrates: NSR, HR 74 with no acute ST changes.    Telemetry:  Telemetry was personally reviewed and demonstrates:  Sinus bradycardia, HR in 50's.    Relevant CV Studies:  Echocardiogram: 07/01/2020 Left Ventricle: The calculated left ventricular ejection  fraction is  63%.    Left Ventricle: There is mild concentric hypertrophy.    Left Ventricle: Wall motion is normal.    Left Ventricle: Systolic function is normal. EF: 60-65%.    Left Ventricle: Doppler parameters indicate normal diastolic function.    Right Ventricle: Right ventricle is mildly dilated.    Right Atrium: Volume is moderately increased.    Pulmonic Valve: Mild regurgitation.  Laboratory Data:  High Sensitivity Troponin:   Recent Labs  Lab 07/26/21 0955  TROPONINIHS 44*     Chemistry Recent Labs  Lab 07/26/21 0955  NA 138  K 4.4  CL 107  CO2 25  GLUCOSE 107*  BUN 11  CREATININE 1.16  CALCIUM 9.4  GFRNONAA >60  ANIONGAP 6    No results for input(s): PROT, ALBUMIN, AST, ALT, ALKPHOS, BILITOT in the last 168 hours. Lipids No results for input(s): CHOL, TRIG, HDL, LABVLDL, LDLCALC, CHOLHDL in the last 168 hours.  Hematology Recent Labs  Lab 07/26/21 0955  WBC 10.6*  RBC 5.55  HGB 15.3  HCT 46.9  MCV 84.5  MCH 27.6  MCHC 32.6  RDW 13.6  PLT 321   Thyroid No results for input(s): TSH, FREET4 in the last 168 hours.  BNPNo results for input(s): BNP, PROBNP in the last 168 hours.  DDimer No results for input(s): DDIMER in the last 168 hours.   Radiology/Studies:  No results found.   Assessment and Plan:   1. Syncope/Dyspnea on Exertion - Presented with a syncopal episode which occurred while playing  basketball and reports associated dyspnea and dizziness prior to this. Initial Hs Troponin 44 with repeat values pending. EKG without acute ST changes.  - Prior NST in 06/2020 showed mild to moderate lateral wall ischemia by review of notes and given his prior stress test results, presenting symptoms and elevated enzymes, will plan for a cardiac catheterization for definitive evaluation. The patient understands that risks include but are not limited to stroke (1 in 1000), death (1 in 13), kidney failure [usually temporary] (1 in 500), bleeding (1 in 200), allergic reaction [possibly serious] (1 in 200).   - Start ASA '81mg'$  daily. Check FLP with low-threshold to initiate statin therapy. No BB given his baseline bradycardia. If enzymes trend upwards, will plan to start Heparin.   2. Accelerated HTN - SBP is currently in the 190's but he has not yet taken his morning medications. Will order the hospital equivalent of his Telmisartan which is Avapro. Titrate as needed. Would avoid AV nodal blocking agents given his intermittent bradycardia.   3. HLD - He reports a history of mild HLD but has not been on medical therapy for this. Will recheck FLP. If enzymes trend upwards or found to have CAD this admission, would plan to initiate high-intensity statin therapy.     Risk Assessment/Risk Scores:     TIMI Risk Score for Unstable Angina or Non-ST Elevation MI:   The patient's TIMI risk score is 3, which indicates a 13% risk of all cause mortality, new or recurrent myocardial infarction or need for urgent revascularization in the next 14 days.   For questions or updates, please contact [place] Please consult [email or web address] for contact info under    Signed, Erma Heritage, PA-C  07/26/2021 2:09 PM    Attending note:  Patient seen and examined.  I reviewed his records and discussed the case with Ms. Delano Metz, I agree with her above findings.  Mr. Stroud presents to the [facility]  after an episode of significant shortness of breath and fatigue that caused him to stop playing basketball, he went to sit down feeling presyncopal, and apparently slumped in his chair, woke up as someone was starting to check on him.  He states that he did not feel any chest discomfort prior to the symptoms.  He underwent previous cardiac evaluation through [facility] and reportedly had a myocardial perfusion study and August of last year that demonstrated a mild to moderate lateral wall ischemic territory.  He states that cardiac catheterization was not pursued at that point, the notes mentioned that he was going to get a second opinion.  He does not report any recurring episodes of breathlessness or chest discomfort with typical activities until today.  Otherwise no palpitations or prior syncope.  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  On examination he appears comfortable.  No chest pain or breathlessness.  He is afebrile, heart rate is in the 50s to 60s in sinus rhythm by telemetry which I personally reviewed.  Systolic blood pressure elevated in the 160-200 range.  He did not take his antihypertensive this morning.  Lungs are clear.  Cardiac exam reveals RRR without gallop.  No peripheral edema.  Pertinent lab work includes potassium 4.4, BUN 11, creatinine 1.16, high-sensitivity troponin I of 44 and 55, hemoglobin 15.3, platelets 321.  Chest x-ray reports no acute process.  I personally reviewed his ECG which shows sinus rhythm with no acute ST segment changes.  Patient presents with recent onset exertional shortness of breath and fatigue followed by an episode of syncope, high-sensitivity troponin I levels are mildly abnormal with concern for ACS.  He already underwent noninvasive cardiac imaging last year with concern for circumflex distribution ischemia.  Plan is admission for further evaluation, continue to cycle cardiac enzymes, check echocardiogram.  Continue ARB, aspirin, would start  empiric statin therapy with FLP pending.  Anticipate diagnostic cardiac catheterization, risks and benefits discussed with the patient and he is in agreement.  Satira Sark, M.D., F.A.C.C.

## 2021-07-26 NOTE — ED Notes (Signed)
ED TO INPATIENT HANDOFF REPORT  ED Nurse Name and Phone #:  [phone]  S Name/Age/Gender Alexander Quinn 65 y.o. male Room/Bed: 038C/038C  Code Status   Code Status: Full Code  Home/SNF/Other Home Patient oriented to: self, place, time, and situation Is this baseline? Yes   Triage Complete: Triage complete  Chief Complaint Syncope [R55]  Triage Note Playing basketball at [facility], ? Over exertion, felt winded, sat down in chair and was found passed out, no injuries, A/O upon medic arrival, no stroke deficits, HR 50"'s intermittently which patient reports as normal.   Allergies Allergies  Allergen Reactions   Simvastatin-High Dose Other (See Comments)    Memory problems    Level of Care/Admitting Diagnosis ED Disposition     ED Disposition  Admit   Condition  --   [place]: [place] [[id number]]  Level of Care: Telemetry Cardiac [103]  May place patient in observation at [facility] or [place] if equivalent level of care is available:: No  Covid Evaluation: Asymptomatic Screening Protocol (No Symptoms)  Diagnosis: Syncope [206001]  Admitting Physician: Orma Flaming [id number]  Attending Physician: Orma Flaming [id number]          B Medical/Surgery History Past Medical History:  Diagnosis Date   Back pain    Hypertension    Mixed hyperlipidemia    Squamous cell carcinoma of scalp    Past Surgical History:  Procedure Laterality Date   BACK SURGERY     65 years of age   BIOPSY PROSTATE  03/2020   2nd Biopsy - Negative per patient   CATARACT EXTRACTION, BILATERAL Bilateral 2000   COLONOSCOPY  2020   2nd Colonoscopy - Performed in [place]   LITHOTRIPSY  12/2019   Covid Screen prior to procedure & negative per patient   MENISCUS REPAIR Left 2010     A IV Location/Drains/Wounds Patient Lines/Drains/Airways Status     Active Line/Drains/Airways     Name Placement date Placement time Site Days   Peripheral  IV 07/26/21 18 G Left Antecubital 07/26/21  --  Antecubital  less than 1            Intake/Output Last 24 hours No intake or output data in the 24 hours ending 07/26/21 2003  Labs/Imaging Results for orders placed or performed during the hospital encounter of 07/26/21 (from the past 48 hour(s))  CBC     Status: Abnormal   Collection Time: 07/26/21  9:55 AM  Result Value Ref Range   WBC 10.6 (H) 4.0 - 10.5 K/uL   RBC 5.55 4.22 - 5.81 MIL/uL   Hemoglobin 15.3 13.0 - 17.0 g/dL   HCT 46.9 39.0 - 52.0 %   MCV 84.5 80.0 - 100.0 fL   MCH 27.6 26.0 - 34.0 pg   MCHC 32.6 30.0 - 36.0 g/dL   RDW 13.6 11.5 - 15.5 %   Platelets 321 150 - 400 K/uL   nRBC 0.0 0.0 - 0.2 %    Comment: Performed at [facility], [address] [place], [place] [id number]  Basic metabolic panel     Status: Abnormal   Collection Time: 07/26/21  9:55 AM  Result Value Ref Range   Sodium 138 135 - 145 mmol/L   Potassium 4.4 3.5 - 5.1 mmol/L   Chloride 107 98 - 111 mmol/L   CO2 25 22 - 32 mmol/L   Glucose, Bld 107 (H) 70 - 99 mg/dL    Comment: Glucose reference  range applies only to samples taken after fasting for at least 8 hours.   BUN 11 8 - 23 mg/dL   Creatinine, Ser 1.16 0.61 - 1.24 mg/dL   Calcium 9.4 8.9 - 10.3 mg/dL   GFR, Estimated >60 >60 mL/min    Comment: (NOTE) Calculated using the CKD-EPI Creatinine Equation (2021)    Anion gap 6 5 - 15    Comment: Performed at [facility] [address], [place], [place] [address]  Troponin I (High Sensitivity)     Status: Abnormal   Collection Time: 07/26/21  9:55 AM  Result Value Ref Range   Troponin I (High Sensitivity) 44 (H) <18 ng/L    Comment: (NOTE) Elevated high sensitivity troponin I (hsTnI) values and significant  changes across serial measurements may suggest ACS but many other  chronic and acute conditions are known to elevate hsTnI results.  Refer to the "Links" section for chest pain algorithms and additional   guidance. Performed at [facility], [place] [address], [place], [place] [address]   Troponin I (High Sensitivity)     Status: Abnormal   Collection Time: 07/26/21 12:49 PM  Result Value Ref Range   Troponin I (High Sensitivity) 55 (H) <18 ng/L    Comment: (NOTE) Elevated high sensitivity troponin I (hsTnI) values and significant  changes across serial measurements may suggest ACS but many other  chronic and acute conditions are known to elevate hsTnI results.  Refer to the "Links" section for chest pain algorithms and additional  guidance. Performed at [facility], [place] [address] [place], [place] [address]   Troponin I (High Sensitivity)     Status: Abnormal   Collection Time: 07/26/21  2:57 PM  Result Value Ref Range   Troponin I (High Sensitivity) 64 (H) <18 ng/L    Comment: (NOTE) Elevated high sensitivity troponin I (hsTnI) values and significant  changes across serial measurements may suggest ACS but many other  chronic and acute conditions are known to elevate hsTnI results.  Refer to the "Links" section for chest pain algorithms and additional  guidance. Performed at [facility], [place] [address], [place], [place] [address]   Troponin I (High Sensitivity)     Status: Abnormal   Collection Time: 07/26/21  5:05 PM  Result Value Ref Range   Troponin I (High Sensitivity) 66 (H) <18 ng/L    Comment: (NOTE) Elevated high sensitivity troponin I (hsTnI) values and significant  changes across serial measurements may suggest ACS but many other  chronic and acute conditions are known to elevate hsTnI results.  Refer to the "Links" section for chest pain algorithms and additional  guidance. Performed at [facility], [place] [address], [place],  [address]    No results found.  Pending Labs Unresulted Labs (From admission, onward)     Start     Ordered   07/27/21 0500  Lipid panel  Tomorrow morning,   R        07/26/21 1359   07/27/21 0500   Hemoglobin A1c  Tomorrow morning,   R        07/26/21 1359   07/27/21 0500  CBC  Tomorrow morning,   R        07/26/21 1458   07/27/21 [id number]  Basic metabolic panel  Tomorrow morning,   R        07/26/21 1458   07/26/21 1550  HIV Antibody (routine testing w rflx)  Once,  R        07/26/21 1550            Vitals/Pain Today's Vitals   07/26/21 1625 07/26/21 1700 07/26/21 1800 07/26/21 1900  BP:  138/72 (!) 144/65 135/67  Pulse:  (!) 49 73 (!) 57  Resp:  16 (!) 24 17  Temp: 98.4 F (36.9 C)     TempSrc: Oral     SpO2:  95% 98% 97%  Weight:      Height:      PainSc:        Isolation Precautions No active isolations  Medications Medications  irbesartan (AVAPRO) tablet 150 mg (150 mg Oral Given 07/26/21 1415)  aspirin EC tablet 81 mg (81 mg Oral Not Given 07/26/21 1527)  nitroGLYCERIN (NITROSTAT) SL tablet 0.4 mg (has no administration in time range)  acetaminophen (TYLENOL) tablet 650 mg (has no administration in time range)  ondansetron (ZOFRAN) injection 4 mg (has no administration in time range)  sodium chloride flush (NS) 0.9 % injection 3 mL (3 mLs Intravenous Given 07/26/21 1529)  sodium chloride flush (NS) 0.9 % injection 3 mL (has no administration in time range)  0.9 %  sodium chloride infusion (has no administration in time range)  aspirin chewable tablet 324 mg (324 mg Oral Given 07/26/21 1300)    Mobility walks Low fall risk   Focused Assessments    R Recommendations: See Admitting Provider Note  Report given to:   Additional Notes:

## 2021-07-26 NOTE — ED Notes (Signed)
Provider at bedside

## 2021-07-26 NOTE — Progress Notes (Signed)
ANTICOAGULATION CONSULT NOTE - Initial Consult  Pharmacy Consult for heparin Indication: chest pain/ACS  Allergies  Allergen Reactions   Simvastatin-High Dose Other (See Comments)    Memory problems    Patient Measurements: Height: '5\' 9"'$  (175.3 cm) Weight: 89.8 kg (198 lb) IBW/kg (Calculated) : 70.7 HEPARIN DW (KG): 89.1   Vital Signs: Temp: 98.7 F (37.1 C) (09/17 2034) Temp Source: Oral (09/17 2034) BP: 140/83 (09/17 2034) Pulse Rate: 51 (09/17 2034)  Labs: Recent Labs    07/26/21 0955 07/26/21 1249 07/26/21 1457 07/26/21 1705  HGB 15.3  --   --   --   HCT 46.9  --   --   --   PLT 321  --   --   --   CREATININE 1.16  --   --   --   TROPONINIHS 44* 55* 64* 66*    Estimated Creatinine Clearance: 68.4 mL/min (by C-G formula based on SCr of 1.16 mg/dL).   Medical History: Past Medical History:  Diagnosis Date   Back pain    Hypertension    Mixed hyperlipidemia    Squamous cell carcinoma of scalp     Medications:  Medications Prior to Admission  Medication Sig Dispense Refill Last Dose   albuterol (VENTOLIN HFA) 108 (90 Base) MCG/ACT inhaler Inhale 1-2 puffs into the lungs See admin instructions. Inhale 1-2 puffs into the lungs every four to six hours as needed for shortness of breath or wheezing   unk   aspirin 325 MG EC tablet Take 325 mg by mouth daily as needed (for back pain or headaches).   07/25/2021 at 1000   Cholecalciferol (VITAMIN D-3) 25 MCG (1000 UT) CAPS Take 1,000 Units by mouth daily.   07/25/2021 at am   CINNAMON PO Take 1,000 mg by mouth daily.   07/25/2021   Coenzyme Q10 100 MG capsule Take 100 mg by mouth daily.   07/25/2021   cyanocobalamin 1000 MCG tablet Take by mouth.   07/25/2021   magnesium 30 MG tablet Take 30 mg by mouth daily.   07/25/2021   POTASSIUM PO Take 1 tablet by mouth daily.   07/25/2021   pyridoxine (B-6) 100 MG tablet Take 100 mg by mouth daily.   07/25/2021   telmisartan (MICARDIS) 40 MG tablet Take 40 mg by mouth in the  morning.   07/25/2021 at am   ZINC SULFATE PO Take 1 tablet by mouth daily.   07/25/2021 at am    Assessment: 65 yo male with elevated troponin. Pharmacy consulted to dose heparin -h= 15.3 -lovenox '40mg'$  given at ~ 3:30pm  Goal of Therapy:  Heparin level 0.3-0.7 units/ml Monitor platelets by anticoagulation protocol: Yes   Plan:  -No heparin bolus due to recent lovenox -start heparin infusion at 1250 units/hr -Heparin level in 6 hours and daily wth CBC daily  Hildred Laser, PharmD Clinical Pharmacist **Pharmacist phone directory can now be found on [email or web address] ([email or web address]).  Listed under [place].

## 2021-07-27 ENCOUNTER — Observation Stay (HOSPITAL_COMMUNITY): Payer: 59

## 2021-07-27 DIAGNOSIS — I214 Non-ST elevation (NSTEMI) myocardial infarction: Secondary | ICD-10-CM | POA: Diagnosis not present

## 2021-07-27 DIAGNOSIS — R55 Syncope and collapse: Secondary | ICD-10-CM

## 2021-07-27 DIAGNOSIS — Z85828 Personal history of other malignant neoplasm of skin: Secondary | ICD-10-CM | POA: Diagnosis not present

## 2021-07-27 DIAGNOSIS — I25110 Atherosclerotic heart disease of native coronary artery with unstable angina pectoris: Secondary | ICD-10-CM | POA: Diagnosis present

## 2021-07-27 DIAGNOSIS — Z6829 Body mass index (BMI) 29.0-29.9, adult: Secondary | ICD-10-CM | POA: Diagnosis not present

## 2021-07-27 DIAGNOSIS — E785 Hyperlipidemia, unspecified: Secondary | ICD-10-CM | POA: Diagnosis not present

## 2021-07-27 DIAGNOSIS — I480 Paroxysmal atrial fibrillation: Secondary | ICD-10-CM | POA: Diagnosis not present

## 2021-07-27 DIAGNOSIS — Z888 Allergy status to other drugs, medicaments and biological substances status: Secondary | ICD-10-CM | POA: Diagnosis not present

## 2021-07-27 DIAGNOSIS — Z87891 Personal history of nicotine dependence: Secondary | ICD-10-CM | POA: Diagnosis not present

## 2021-07-27 DIAGNOSIS — J9811 Atelectasis: Secondary | ICD-10-CM | POA: Diagnosis not present

## 2021-07-27 DIAGNOSIS — I088 Other rheumatic multiple valve diseases: Secondary | ICD-10-CM | POA: Diagnosis not present

## 2021-07-27 DIAGNOSIS — R0602 Shortness of breath: Secondary | ICD-10-CM | POA: Diagnosis not present

## 2021-07-27 DIAGNOSIS — E8770 Fluid overload, unspecified: Secondary | ICD-10-CM | POA: Diagnosis not present

## 2021-07-27 DIAGNOSIS — I517 Cardiomegaly: Secondary | ICD-10-CM | POA: Diagnosis not present

## 2021-07-27 DIAGNOSIS — D62 Acute posthemorrhagic anemia: Secondary | ICD-10-CM | POA: Diagnosis not present

## 2021-07-27 DIAGNOSIS — Z809 Family history of malignant neoplasm, unspecified: Secondary | ICD-10-CM | POA: Diagnosis not present

## 2021-07-27 DIAGNOSIS — E782 Mixed hyperlipidemia: Secondary | ICD-10-CM | POA: Diagnosis not present

## 2021-07-27 DIAGNOSIS — I2510 Atherosclerotic heart disease of native coronary artery without angina pectoris: Secondary | ICD-10-CM | POA: Diagnosis not present

## 2021-07-27 DIAGNOSIS — Z811 Family history of alcohol abuse and dependence: Secondary | ICD-10-CM | POA: Diagnosis not present

## 2021-07-27 DIAGNOSIS — Z6831 Body mass index (BMI) 31.0-31.9, adult: Secondary | ICD-10-CM | POA: Diagnosis not present

## 2021-07-27 DIAGNOSIS — I440 Atrioventricular block, first degree: Secondary | ICD-10-CM | POA: Diagnosis present

## 2021-07-27 DIAGNOSIS — I10 Essential (primary) hypertension: Secondary | ICD-10-CM | POA: Diagnosis not present

## 2021-07-27 DIAGNOSIS — Z951 Presence of aortocoronary bypass graft: Secondary | ICD-10-CM | POA: Diagnosis not present

## 2021-07-27 DIAGNOSIS — J90 Pleural effusion, not elsewhere classified: Secondary | ICD-10-CM | POA: Diagnosis not present

## 2021-07-27 DIAGNOSIS — R778 Other specified abnormalities of plasma proteins: Secondary | ICD-10-CM | POA: Diagnosis not present

## 2021-07-27 DIAGNOSIS — Z20822 Contact with and (suspected) exposure to covid-19: Secondary | ICD-10-CM | POA: Diagnosis present

## 2021-07-27 DIAGNOSIS — N401 Enlarged prostate with lower urinary tract symptoms: Secondary | ICD-10-CM | POA: Diagnosis present

## 2021-07-27 DIAGNOSIS — E669 Obesity, unspecified: Secondary | ICD-10-CM | POA: Diagnosis present

## 2021-07-27 DIAGNOSIS — R531 Weakness: Secondary | ICD-10-CM | POA: Diagnosis not present

## 2021-07-27 DIAGNOSIS — R351 Nocturia: Secondary | ICD-10-CM | POA: Diagnosis present

## 2021-07-27 DIAGNOSIS — E538 Deficiency of other specified B group vitamins: Secondary | ICD-10-CM | POA: Diagnosis present

## 2021-07-27 DIAGNOSIS — Z7982 Long term (current) use of aspirin: Secondary | ICD-10-CM | POA: Diagnosis not present

## 2021-07-27 DIAGNOSIS — I252 Old myocardial infarction: Secondary | ICD-10-CM | POA: Diagnosis not present

## 2021-07-27 LAB — BASIC METABOLIC PANEL
Anion gap: 10 (ref 5–15)
BUN: 13 mg/dL (ref 8–23)
CO2: 24 mmol/L (ref 22–32)
Calcium: 8.9 mg/dL (ref 8.9–10.3)
Chloride: 106 mmol/L (ref 98–111)
Creatinine, Ser: 1.15 mg/dL (ref 0.61–1.24)
GFR, Estimated: >60 mL/min (ref >60)
Glucose, Bld: 100 mg/dL — ABNORMAL HIGH (ref 70–99)
Potassium: 3.8 mmol/L (ref 3.5–5.1)
Sodium: 140 mmol/L (ref 135–145)

## 2021-07-27 LAB — HEPARIN LEVEL (UNFRACTIONATED)
Heparin Unfractionated: 0.1 IU/mL — ABNORMAL LOW (ref 0.30–0.70)
Heparin Unfractionated: <0.1 IU/mL — ABNORMAL LOW (ref 0.30–0.70)
Heparin Unfractionated: >1.1 IU/mL — ABNORMAL HIGH (ref 0.30–0.70)

## 2021-07-27 LAB — ECHOCARDIOGRAM COMPLETE
Area-P 1/2: 2.5 cm2
Height: 69 in
S' Lateral: 3 cm
Weight: 3184 oz

## 2021-07-27 LAB — HEMOGLOBIN A1C
Hgb A1c MFr Bld: 5.5 % (ref 4.8–5.6)
Mean Plasma Glucose: 111.15 mg/dL

## 2021-07-27 LAB — CBC
HCT: 44.5 % (ref 39.0–52.0)
Hemoglobin: 15 g/dL (ref 13.0–17.0)
MCH: 27.9 pg (ref 26.0–34.0)
MCHC: 33.7 g/dL (ref 30.0–36.0)
MCV: 82.9 fL (ref 80.0–100.0)
Platelets: 328 10*3/uL (ref 150–400)
RBC: 5.37 MIL/uL (ref 4.22–5.81)
RDW: 13.8 % (ref 11.5–15.5)
WBC: 4.9 10*3/uL (ref 4.0–10.5)
nRBC: 0 % (ref 0.0–0.2)

## 2021-07-27 LAB — LIPID PANEL
Cholesterol: 217 mg/dL — ABNORMAL HIGH (ref 0–200)
HDL: 40 mg/dL — ABNORMAL LOW (ref >40)
LDL Cholesterol: 158 mg/dL — ABNORMAL HIGH (ref 0–99)
Total CHOL/HDL Ratio: 5.4 RATIO
Triglycerides: 96 mg/dL (ref <150)
VLDL: 19 mg/dL (ref 0–40)

## 2021-07-27 LAB — MAGNESIUM: Magnesium: 2.3 mg/dL (ref 1.7–2.4)

## 2021-07-27 LAB — HIV ANTIBODY (ROUTINE TESTING W REFLEX): HIV Screen 4th Generation wRfx: NONREACTIVE

## 2021-07-27 MED ORDER — ASPIRIN 81 MG PO CHEW
81.0000 mg | CHEWABLE_TABLET | ORAL | Status: AC
  Administered 2021-07-28: 81 mg via ORAL
  Filled 2021-07-27: qty 1

## 2021-07-27 MED ORDER — ROSUVASTATIN CALCIUM 20 MG PO TABS
20.0000 mg | ORAL_TABLET | Freq: Every day | ORAL | Status: DC
  Administered 2021-07-27 – 2021-07-28 (×2): 20 mg via ORAL
  Filled 2021-07-27 (×2): qty 1

## 2021-07-27 MED ORDER — SODIUM CHLORIDE 0.9 % WEIGHT BASED INFUSION
1.0000 mL/kg/h | INTRAVENOUS | Status: DC
  Administered 2021-07-28: 1 mL/kg/h via INTRAVENOUS

## 2021-07-27 MED ORDER — HEPARIN (PORCINE) 25000 UT/250ML-% IV SOLN
1400.0000 [IU]/h | INTRAVENOUS | Status: DC
  Administered 2021-07-27: 1400 [IU]/h via INTRAVENOUS
  Filled 2021-07-27: qty 250

## 2021-07-27 MED ORDER — HEPARIN BOLUS VIA INFUSION
2500.0000 [IU] | Freq: Once | INTRAVENOUS | Status: AC
  Administered 2021-07-27: 2500 [IU] via INTRAVENOUS
  Filled 2021-07-27: qty 2500

## 2021-07-27 MED ORDER — SODIUM CHLORIDE 0.9 % WEIGHT BASED INFUSION
3.0000 mL/kg/h | INTRAVENOUS | Status: DC
  Administered 2021-07-28: 3 mL/kg/h via INTRAVENOUS

## 2021-07-27 MED ORDER — SODIUM CHLORIDE 0.9% FLUSH: 3.0000 mL | INTRAVENOUS | Status: DC | PRN

## 2021-07-27 MED ORDER — AMLODIPINE BESYLATE 10 MG PO TABS
10.0000 mg | ORAL_TABLET | Freq: Every day | ORAL | Status: DC
  Administered 2021-07-27 – 2021-07-30 (×4): 10 mg via ORAL
  Filled 2021-07-27 (×4): qty 1

## 2021-07-27 MED ORDER — SODIUM CHLORIDE 0.9 % IV SOLN: 250.0000 mL | INTRAVENOUS | Status: DC | PRN

## 2021-07-27 MED ORDER — SODIUM CHLORIDE 0.9% FLUSH
3.0000 mL | Freq: Two times a day (BID) | INTRAVENOUS | Status: DC
  Administered 2021-07-27 – 2021-07-28 (×2): 3 mL via INTRAVENOUS

## 2021-07-27 NOTE — Progress Notes (Signed)
[place] for heparin Indication: chest pain/ACS Heparin Dosing Weight 89.1 kg  Allergies  Allergen Reactions   Simvastatin-High Dose Other (See Comments)    Memory problems    Patient Measurements: Height: '5\' 9"'$  (175.3 cm) Weight: 90.3 kg (199 lb) IBW/kg (Calculated) : 70.7 HEPARIN DW (KG): 89.1   Vital Signs: Temp: 98.4 F (36.9 C) (09/18 1016) Temp Source: Oral (09/18 1016) BP: 159/90 (09/18 1016) Pulse Rate: 53 (09/18 1016)  Labs: Recent Labs    07/26/21 0955 07/26/21 1249 07/26/21 1457 07/26/21 1705 07/27/21 0246 07/27/21 1041  HGB 15.3  --   --   --  15.0  --   HCT 46.9  --   --   --  44.5  --   PLT 321  --   --   --  328  --   HEPARINUNFRC  --   --   --   --  0.10* <0.10*  CREATININE 1.16  --   --   --  1.15  --   TROPONINIHS 44* 55* 64* 66*  --   --      Estimated Creatinine Clearance: 69.2 mL/min (by C-G formula based on SCr of 1.15 mg/dL).   Assessment: 65 yo male with elevated troponin. Pharmacy consulted to dose heparin for NSTEMI.  Heparin level subtherapeutic (<0.1) on gtt at 1550 units/hr. Confirmed line was intact with nursing. No bleeding concerns per nursing. CBC stable.  Goal of Therapy:  Heparin level 0.3-0.7 units/ml Monitor platelets by anticoagulation protocol: Yes   Plan:  Bolus heparin 2500 units Increase heparin infusion to 1850 units/hr F/u heparin level in 6 hours  Thank you for allowing pharmacy to participate in this patient's care.  Reatha Harps, PharmD PGY1 Pharmacy Resident 07/27/2021 12:03 PM Check [email or web address] for unit specific pharmacy number

## 2021-07-27 NOTE — Progress Notes (Signed)
  Called by nursing , patient with an episode of bradycardia.  Heart rate went down to 36 then rebounded.

## 2021-07-27 NOTE — Plan of Care (Signed)
  Problem: Skin Integrity: Goal: Risk for impaired skin integrity will decrease Outcome: Completed/Met   Problem: Safety: Goal: Ability to remain free from injury will improve Outcome: Completed/Met   Problem: Pain Managment: Goal: General experience of comfort will improve Outcome: Completed/Met   Problem: Elimination: Goal: Will not experience complications related to urinary retention Outcome: Completed/Met   Problem: Elimination: Goal: Will not experience complications related to bowel motility Outcome: Completed/Met   Problem: Coping: Goal: Level of anxiety will decrease Outcome: Completed/Met   Problem: Nutrition: Goal: Adequate nutrition will be maintained Outcome: Completed/Met   

## 2021-07-27 NOTE — Plan of Care (Signed)
  Problem: Education: Goal: Knowledge of General Education information will improve Description: Including pain rating scale, medication(s)/side effects and non-pharmacologic comfort measures Outcome: Progressing   Problem: Clinical Measurements: Goal: Cardiovascular complication will be avoided Outcome: Progressing   

## 2021-07-27 NOTE — Progress Notes (Signed)
Progress Note  Patient Name: Alexander Quinn Date of Encounter: 07/27/2021  Primary Cardiologist: Osborne Oman  Subjective   No chest pain or shortness of breath.  Inpatient Medications    Scheduled Meds:  aspirin EC  81 mg Oral Daily   cholecalciferol  1,000 Units Oral Daily   irbesartan  150 mg Oral Daily   magnesium oxide  200 mg Oral Daily   rosuvastatin  20 mg Oral q1800   sodium chloride flush  3 mL Intravenous Q12H   cyanocobalamin  1,000 mcg Oral Daily   Continuous Infusions:  heparin 1,550 Units/hr (07/27/21 0451)   PRN Meds: acetaminophen, nitroGLYCERIN, ondansetron (ZOFRAN) IV   Vital Signs    Vitals:   07/27/21 0025 07/27/21 0100 07/27/21 0420 07/27/21 0728  BP: 139/80  (!) 123/98 (!) 153/76  Pulse: (!) 46  (!) 45 (!) 56  Resp: '18  16 17  '$ Temp: 98.3 F (36.8 C)  98.3 F (36.8 C) 98.1 F (36.7 C)  TempSrc: Oral  Oral Oral  SpO2: 100%  99% 95%  Weight:  90.3 kg    Height:        Intake/Output Summary (Last 24 hours) at 07/27/2021 1022 Last data filed at 07/27/2021 0914 Gross per 24 hour  Intake 844.08 ml  Output 625 ml  Net 219.08 ml   Filed Weights   07/26/21 0833 07/26/21 2033 07/27/21 0100  Weight: 90.7 kg 89.8 kg 90.3 kg    Telemetry    Sinus rhythm and sinus bradycardia.  Personally reviewed.  ECG    An ECG dated 07/27/2021 was personally reviewed today and demonstrated:  Sinus bradycardia with nonspecific T wave changes.  Physical Exam   GEN: No acute distress.   Neck: No JVD. Cardiac: RRR, no murmur, rub, or gallop.  Respiratory: Nonlabored. Clear to auscultation bilaterally. GI: Soft, nontender, bowel sounds present. MS: No edema; No deformity. Neuro:  Nonfocal. Psych: Alert and oriented x 3. Normal affect.  Labs    Chemistry Recent Labs  Lab 07/26/21 0955 07/27/21 0246  NA 138 140  K 4.4 3.8  CL 107 106  CO2 25 24  GLUCOSE 107* 100*  BUN 11 13  CREATININE 1.16 1.15  CALCIUM 9.4 8.9  GFRNONAA >60 >60  ANIONGAP 6  10     Hematology Recent Labs  Lab 07/26/21 0955 07/27/21 0246  WBC 10.6* 4.9  RBC 5.55 5.37  HGB 15.3 15.0  HCT 46.9 44.5  MCV 84.5 82.9  MCH 27.6 27.9  MCHC 32.6 33.7  RDW 13.6 13.8  PLT 321 328    Cardiac Enzymes Recent Labs  Lab 07/26/21 0955 07/26/21 1249 07/26/21 1457 07/26/21 1705  TROPONINIHS 44* 55* 64* 66*    Cardiac Studies   Echocardiogram pending.  Assessment & Plan    1.  Unstable angina manifested as worsening shortness of breath and syncope with exercise.  High-sensitivity troponin I levels are mildly abnormal suggestive of NSTEMI, ECG without acute ST segment changes however.  Previous testing through the [facility] system included a myocardial perfusion study in August of last year which was suggestive of lateral ischemia.  Plan is for diagnostic cardiac catheterization for further assessment.  2.  Essential hypertension, on Micardis as an outpatient.  3.  Reported history of statin intolerance, patient indicates memory problems on Zocor and has also not tolerated one other formulation.  Current LDL is 158.  Currently with trial of Crestor.  May ultimately be a candidate for PCSK9 inhibitor.  Discussed with patient  and wife today, answered questions about procedure tomorrow.  He is scheduled for a diagnostic cardiac catheterization, echocardiogram also pending.  Continue aspirin, Avapro, Crestor, and IV heparin.  He is not on beta-blocker given low resting heart rate.  He states that he is interested in establishing with our practice here in [place].  Signed, Rozann Lesches, MD  07/27/2021, 10:22 AM

## 2021-07-27 NOTE — Progress Notes (Signed)
[place] [place] for heparin Indication: chest pain/ACS   Allergies  Allergen Reactions   Simvastatin-High Dose Other (See Comments)    Memory problems    Patient Measurements: Height: '5\' 9"'$  (175.3 cm) Weight: 90.3 kg (199 lb) IBW/kg (Calculated) : 70.7 HEPARIN DW (KG): 89.1   Vital Signs: Temp: 98.2 F (36.8 C) (09/18 1624) Temp Source: Oral (09/18 1624) BP: 119/59 (09/18 1624) Pulse Rate: 53 (09/18 1016)  Labs: Recent Labs    07/26/21 0955 07/26/21 1249 07/26/21 1457 07/26/21 1705 07/27/21 0246 07/27/21 1041 07/27/21 1806  HGB 15.3  --   --   --  15.0  --   --   HCT 46.9  --   --   --  44.5  --   --   PLT 321  --   --   --  328  --   --   HEPARINUNFRC  --   --   --   --  0.10* <0.10* >1.10*  CREATININE 1.16  --   --   --  1.15  --   --   TROPONINIHS 44* 55* 64* 66*  --   --   --      Estimated Creatinine Clearance: 69.2 mL/min (by C-G formula based on SCr of 1.15 mg/dL).   Assessment: 65 yo male with elevated troponin. Pharmacy consulted to dose heparin for NSTEMI.  Heparin level this evening is SUPRAtherapeutic (HL >1.1, goal of 0.3-0.7). Per discussion with RN - drawn from opposite arm. There was some concern for the prior IV site not working appropriately so the IV site was switched earlier today. The patient is having some bleeding around where the old IV site was - no other bleeding noted at this time. Will hold heparin and resume at a lower rate.   Goal of Therapy:  Heparin level 0.3-0.7 units/ml Monitor platelets by anticoagulation protocol: Yes   Plan:  - Hold Heparin for 1 hour - Reduce Heparin to 1400 units/hr (14 ml/hr) - Will continue to monitor for any signs/symptoms of bleeding and will follow up with heparin level in 6 hours   Thank you for allowing pharmacy to be a part of this patient's care.  Alycia Rossetti, PharmD, BCPS Clinical Pharmacist Clinical phone for 07/27/2021: [phone] 07/27/2021 7:08 PM    **Pharmacist phone directory can now be found on [email or web address] ([email or web address]).  Listed under [place].

## 2021-07-27 NOTE — Progress Notes (Signed)
[facility] for heparin Indication: chest pain/ACS  Allergies  Allergen Reactions   Simvastatin-High Dose Other (See Comments)    Memory problems    Patient Measurements: Height: '5\' 9"'$  (175.3 cm) Weight: 90.3 kg (199 lb) IBW/kg (Calculated) : 70.7 HEPARIN DW (KG): 89.1   Vital Signs: Temp: 98.3 F (36.8 C) (09/18 0420) Temp Source: Oral (09/18 0420) BP: 123/98 (09/18 0420) Pulse Rate: 45 (09/18 0420)  Labs: Recent Labs    07/26/21 0955 07/26/21 1249 07/26/21 1457 07/26/21 1705 07/27/21 0246  HGB 15.3  --   --   --  15.0  HCT 46.9  --   --   --  44.5  PLT 321  --   --   --  328  HEPARINUNFRC  --   --   --   --  0.10*  CREATININE 1.16  --   --   --  1.15  TROPONINIHS 44* 55* 64* 66*  --      Estimated Creatinine Clearance: 69.2 mL/min (by C-G formula based on SCr of 1.15 mg/dL).   Assessment: 65 yo male with elevated troponin. Pharmacy consulted to dose heparin.  Heparin level subtherapeutic (0.1) on gtt at 1250 units/hr. No issues with line or bleeding reported per RN.  Goal of Therapy:  Heparin level 0.3-0.7 units/ml Monitor platelets by anticoagulation protocol: Yes   Plan:  Bolus heparin 2500 units Increase heparin infusion to 1550 units/hr F/u heparin level in 6 hours  Sherlon Handing, PharmD, BCPS Please see amion for complete clinical pharmacist phone list 07/27/2021 4:27 AM

## 2021-07-27 NOTE — Progress Notes (Signed)
PROGRESS NOTE                                                                                                                                                                                                             Patient Demographics:    Alexander Quinn, is a 65 y.o. male, DOB - 1956-04-12, [id number]  Outpatient Primary MD for the patient is Luana Shu, Mike Craze, MD    LOS - 0  Admit date - 07/26/2021    Chief Complaint  Patient presents with   Loss of Consciousness       Brief Narrative (HPI from H&P)  - Alexander Quinn is a 65 y.o. male with medical history significant of HTN, HLD, BPH and obesity presenting to ED with syncopal event. He was playing basketball at the [employer] around 7:00AM. After 30 minutes he needed to sit down because he was breathing hard, came to the ER and was diagnosed with NSTEMI.   Subjective:    Jahdai Beyers today has, No headache, No chest pain, No abdominal pain - No Nausea, No new weakness tingling or numbness, no SOB   Assessment  & Plan :     Syncope post exertion with NSTEMI - Cards following, on combination of aspirin, statin and heparin drip, avoiding beta-blocker due to resting bradycardia, plan is for left heart catheterization on 07/28/2021, currently symptom-free.  Continue to monitor, echo pending.  2.  Dyslipidemia.  Trial of Crestor.  Reported statin allergy.  Will monitor closely.  If he has issues he will follow with cardiology outpatient for PCSK9  infusions.  3.  Hypertension.  Currently on ARB, added Norvasc for better control.  4.  History of B12 deficiency.  On replacement.            Condition - Fair  Family Communication  :  None present  Code Status :  Full  Consults  :  Cards  PUD Prophylaxis :    Procedures  :     TTE  L. Heart Cath      Disposition Plan  :    Status is: Inpt  Dispo: The patient is from: Home              Anticipated d/c  is to: Home  Patient currently is not medically stable to d/c.   Difficult to place patient No  DVT Prophylaxis  :  Hep gtt    Lab Results  Component Value Date   PLT 328 07/27/2021    Diet :  Diet Order             Diet Heart Room service appropriate? Yes; Fluid consistency: Thin  Diet effective now                    Inpatient Medications  Scheduled Meds:  amLODipine  10 mg Oral Daily   aspirin EC  81 mg Oral Daily   cholecalciferol  1,000 Units Oral Daily   irbesartan  150 mg Oral Daily   magnesium oxide  200 mg Oral Daily   rosuvastatin  20 mg Oral q1800   sodium chloride flush  3 mL Intravenous Q12H   cyanocobalamin  1,000 mcg Oral Daily   Continuous Infusions:  heparin 1,550 Units/hr (07/27/21 0451)   PRN Meds:.acetaminophen, nitroGLYCERIN, ondansetron (ZOFRAN) IV  Antibiotics  :    Anti-infectives (From admission, onward)    None        Time Spent in minutes  30   Lala Lund M.D on 07/27/2021 at 11:09 AM  To page go to [email or web address]   [facility] -  Office  [phone]      See all Orders from today for further details    Objective:   Vitals:   07/27/21 0100 07/27/21 0420 07/27/21 0728 07/27/21 1016  BP:  (!) 123/98 (!) 153/76 (!) 159/90  Pulse:  (!) 45 (!) 56 (!) 53  Resp:  '16 17 16  '$ Temp:  98.3 F (36.8 C) 98.1 F (36.7 C) 98.4 F (36.9 C)  TempSrc:  Oral Oral Oral  SpO2:  99% 95% 97%  Weight: 90.3 kg     Height:        Wt Readings from Last 3 Encounters:  07/27/21 90.3 kg  04/01/21 92.5 kg  07/29/18 93 kg     Intake/Output Summary (Last 24 hours) at 07/27/2021 1109 Last data filed at 07/27/2021 0914 Gross per 24 hour  Intake 844.08 ml  Output 625 ml  Net 219.08 ml     Physical Exam  Awake Alert, No new F.N deficits, Normal affect [place].AT,PERRAL Supple Neck,No JVD, No cervical lymphadenopathy appriciated.  Symmetrical Chest wall movement, Good air movement bilaterally, CTAB RRR,No  Gallops,Rubs or new Murmurs, No Parasternal Heave +ve B.Sounds, Abd Soft, No tenderness, No organomegaly appriciated, No rebound - guarding or rigidity. No Cyanosis, Clubbing or edema, No new Rash or bruise    Data Review:    CBC Recent Labs  Lab 07/26/21 0955 07/27/21 0246  WBC 10.6* 4.9  HGB 15.3 15.0  HCT 46.9 44.5  PLT 321 328  MCV 84.5 82.9  MCH 27.6 27.9  MCHC 32.6 33.7  RDW 13.6 13.8    Recent Labs  Lab 07/26/21 0955 07/27/21 0246  NA 138 140  K 4.4 3.8  CL 107 106  CO2 25 24  GLUCOSE 107* 100*  BUN 11 13  CREATININE 1.16 1.15  CALCIUM 9.4 8.9  MG  --  2.3  HGBA1C  --  5.5    ------------------------------------------------------------------------------------------------------------------ Recent Labs    07/27/21 0246  CHOL 217*  HDL 40*  LDLCALC 158*  TRIG 96  CHOLHDL 5.4    Lab Results  Component Value Date   HGBA1C 5.5 07/27/2021   ------------------------------------------------------------------------------------------------------------------ No results for input(s): TSH,  T4TOTAL, T3FREE, THYROIDAB in the last 72 hours.  Invalid input(s): FREET3  Cardiac Enzymes No results for input(s): CKMB, TROPONINI, MYOGLOBIN in the last 168 hours.  Invalid input(s): CK ------------------------------------------------------------------------------------------------------------------    Component Value Date/Time   BNP CANCELED 05/06/2020 0855     Radiology Reports No results found.

## 2021-07-28 ENCOUNTER — Encounter (HOSPITAL_COMMUNITY)
Admission: EM | Disposition: A | Discharge status: Home / Self Care | Payer: Self-pay | Source: Home / Self Care | Attending: Thoracic Surgery (Cardiothoracic Vascular Surgery)

## 2021-07-28 ENCOUNTER — Encounter (HOSPITAL_COMMUNITY): Payer: Self-pay | Admitting: Interventional Cardiology

## 2021-07-28 DIAGNOSIS — E782 Mixed hyperlipidemia: Secondary | ICD-10-CM | POA: Diagnosis not present

## 2021-07-28 DIAGNOSIS — I2510 Atherosclerotic heart disease of native coronary artery without angina pectoris: Secondary | ICD-10-CM

## 2021-07-28 DIAGNOSIS — R778 Other specified abnormalities of plasma proteins: Secondary | ICD-10-CM | POA: Diagnosis not present

## 2021-07-28 DIAGNOSIS — I214 Non-ST elevation (NSTEMI) myocardial infarction: Secondary | ICD-10-CM | POA: Diagnosis not present

## 2021-07-28 DIAGNOSIS — R55 Syncope and collapse: Secondary | ICD-10-CM | POA: Diagnosis not present

## 2021-07-28 DIAGNOSIS — I10 Essential (primary) hypertension: Secondary | ICD-10-CM | POA: Diagnosis not present

## 2021-07-28 HISTORY — PX: LEFT HEART CATH AND CORONARY ANGIOGRAPHY: CATH118249

## 2021-07-28 LAB — HEPARIN LEVEL (UNFRACTIONATED)
Heparin Unfractionated: >1.1 IU/mL — ABNORMAL HIGH (ref 0.30–0.70)
Heparin Unfractionated: 0.47 IU/mL (ref 0.30–0.70)

## 2021-07-28 LAB — CBC
HCT: 45.5 % (ref 39.0–52.0)
Hemoglobin: 14.7 g/dL (ref 13.0–17.0)
MCH: 27.4 pg (ref 26.0–34.0)
MCHC: 32.3 g/dL (ref 30.0–36.0)
MCV: 84.9 fL (ref 80.0–100.0)
Platelets: 337 10*3/uL (ref 150–400)
RBC: 5.36 MIL/uL (ref 4.22–5.81)
RDW: 13.8 % (ref 11.5–15.5)
WBC: 6.3 10*3/uL (ref 4.0–10.5)
nRBC: 0 % (ref 0.0–0.2)

## 2021-07-28 SURGERY — LEFT HEART CATH AND CORONARY ANGIOGRAPHY
Anesthesia: LOCAL

## 2021-07-28 MED ORDER — ISOSORBIDE MONONITRATE ER 30 MG PO TB24
30.0000 mg | ORAL_TABLET | Freq: Every day | ORAL | Status: DC
  Administered 2021-07-28 – 2021-07-29 (×2): 30 mg via ORAL
  Filled 2021-07-28 (×2): qty 1

## 2021-07-28 MED ORDER — LIDOCAINE HCL (PF) 1 % IJ SOLN
INTRAMUSCULAR | Status: AC
  Filled 2021-07-28: qty 30

## 2021-07-28 MED ORDER — SODIUM CHLORIDE 0.9% FLUSH: 3.0000 mL | INTRAVENOUS | Status: DC | PRN

## 2021-07-28 MED ORDER — SODIUM CHLORIDE 0.9 % IV SOLN
250.0000 mL | INTRAVENOUS | Status: DC | PRN
Start: 2021-07-28 — End: 2021-07-31

## 2021-07-28 MED ORDER — MIDAZOLAM HCL 2 MG/2ML IJ SOLN
INTRAMUSCULAR | Status: DC | PRN
  Administered 2021-07-28: 2 mg via INTRAVENOUS

## 2021-07-28 MED ORDER — HEPARIN SODIUM (PORCINE) 1000 UNIT/ML IJ SOLN
INTRAMUSCULAR | Status: DC | PRN
  Administered 2021-07-28: 4500 [IU] via INTRAVENOUS

## 2021-07-28 MED ORDER — SODIUM CHLORIDE 0.9 % IV SOLN: INTRAVENOUS | Status: AC

## 2021-07-28 MED ORDER — FENTANYL CITRATE (PF) 100 MCG/2ML IJ SOLN
INTRAMUSCULAR | Status: AC
  Filled 2021-07-28: qty 2

## 2021-07-28 MED ORDER — HEPARIN (PORCINE) IN NACL 1000-0.9 UT/500ML-% IV SOLN
INTRAVENOUS | Status: AC
  Filled 2021-07-28: qty 1000

## 2021-07-28 MED ORDER — FENTANYL CITRATE (PF) 100 MCG/2ML IJ SOLN
INTRAMUSCULAR | Status: DC | PRN
  Administered 2021-07-28: 25 ug via INTRAVENOUS

## 2021-07-28 MED ORDER — ONDANSETRON HCL 4 MG/2ML IJ SOLN: 4.0000 mg | Freq: Four times a day (QID) | INTRAMUSCULAR | Status: DC | PRN

## 2021-07-28 MED ORDER — HEPARIN (PORCINE) 25000 UT/250ML-% IV SOLN
1200.0000 [IU]/h | INTRAVENOUS | Status: DC
  Administered 2021-07-29: 1000 [IU]/h via INTRAVENOUS
  Administered 2021-07-29 – 2021-07-30 (×2): 1200 [IU]/h via INTRAVENOUS
  Filled 2021-07-28 (×2): qty 250

## 2021-07-28 MED ORDER — SODIUM CHLORIDE 0.9% FLUSH
3.0000 mL | Freq: Two times a day (BID) | INTRAVENOUS | Status: DC
  Administered 2021-07-28 – 2021-07-30 (×3): 3 mL via INTRAVENOUS

## 2021-07-28 MED ORDER — LIDOCAINE HCL (PF) 1 % IJ SOLN
INTRAMUSCULAR | Status: DC | PRN
  Administered 2021-07-28: 2 mL

## 2021-07-28 MED ORDER — HYDRALAZINE HCL 20 MG/ML IJ SOLN: 10.0000 mg | INTRAMUSCULAR | Status: AC | PRN

## 2021-07-28 MED ORDER — VERAPAMIL HCL 2.5 MG/ML IV SOLN
INTRAVENOUS | Status: AC
  Filled 2021-07-28: qty 2

## 2021-07-28 MED ORDER — HEPARIN (PORCINE) IN NACL 1000-0.9 UT/500ML-% IV SOLN
INTRAVENOUS | Status: DC | PRN
  Administered 2021-07-28 (×2): 500 mL

## 2021-07-28 MED ORDER — HEPARIN SODIUM (PORCINE) 1000 UNIT/ML IJ SOLN
INTRAMUSCULAR | Status: AC
  Filled 2021-07-28: qty 1

## 2021-07-28 MED ORDER — MIDAZOLAM HCL 2 MG/2ML IJ SOLN
INTRAMUSCULAR | Status: AC
  Filled 2021-07-28: qty 2

## 2021-07-28 MED ORDER — HEPARIN (PORCINE) 25000 UT/250ML-% IV SOLN: 1000.0000 [IU]/h | INTRAVENOUS | Status: DC

## 2021-07-28 MED ORDER — LABETALOL HCL 5 MG/ML IV SOLN: 10.0000 mg | INTRAVENOUS | Status: AC | PRN

## 2021-07-28 MED ORDER — ACETAMINOPHEN 325 MG PO TABS
650.0000 mg | ORAL_TABLET | ORAL | Status: DC | PRN
  Filled 2021-07-28: qty 2

## 2021-07-28 MED ORDER — IOHEXOL 350 MG/ML SOLN
INTRAVENOUS | Status: DC | PRN
  Administered 2021-07-28: 60 mL

## 2021-07-28 SURGICAL SUPPLY — 10 items
CATH 5FR JL3.5 JR4 ANG PIG MP (CATHETERS) ×2 IMPLANT
DEVICE RAD COMP TR BAND LRG (VASCULAR PRODUCTS) ×2 IMPLANT
GLIDESHEATH SLEND SS 6F .021 (SHEATH) ×2 IMPLANT
GUIDEWIRE INQWIRE 1.5J.035X260 (WIRE) ×1 IMPLANT
INQWIRE 1.5J .035X260CM (WIRE) ×2
KIT HEART LEFT (KITS) ×2 IMPLANT
PACK CARDIAC CATHETERIZATION (CUSTOM PROCEDURE TRAY) ×2 IMPLANT
SYR MEDRAD MARK 7 150ML (SYRINGE) ×2 IMPLANT
TRANSDUCER W/STOPCOCK (MISCELLANEOUS) ×2 IMPLANT
TUBING CIL FLEX 10 FLL-RA (TUBING) ×2 IMPLANT

## 2021-07-28 NOTE — Progress Notes (Signed)
ANTICOAGULATION CONSULT NOTE - Follow Up Consult  Pharmacy Consult for heparin Indication: chest pain/ACS  Labs: Recent Labs    07/26/21 0955 07/26/21 0955 07/26/21 1249 07/26/21 1457 07/26/21 1705 07/27/21 0246 07/27/21 1041 07/27/21 1806 07/28/21 0501  HGB 15.3  --   --   --   --  15.0  --   --  14.7  HCT 46.9  --   --   --   --  44.5  --   --  45.5  PLT 321  --   --   --   --  328  --   --  337  HEPARINUNFRC  --    < >  --   --   --  0.10* <0.10* >1.10* >1.10*  CREATININE 1.16  --   --   --   --  1.15  --   --   --   TROPONINIHS 44*  --  55* 64* 66*  --   --   --   --    < > = values in this interval not displayed.    Assessment: 65yo male remains supratherapeutic on heparin after rate change; no infusion issues or signs of bleeding per RN.  Goal of Therapy:  Heparin level 0.3-0.7 units/ml   Plan:  Will hold heparin infusion x1h then decrease heparin infusion by 4 units/kg/hr to 1000 units/hr and check level in 6 hours.    Wynona Neat, PharmD, BCPS  07/28/2021,5:48 AM

## 2021-07-28 NOTE — Progress Notes (Addendum)
Progress Note  Patient Name: Alexander Quinn Date of Encounter: 07/28/2021  [facility] Cardiologist: None [facility] but like to follow up with [place]  Subjective   Feeling well. No chest pain, sob or palpitations.    Inpatient Medications    Scheduled Meds:  amLODipine  10 mg Oral Daily   aspirin EC  81 mg Oral Daily   cholecalciferol  1,000 Units Oral Daily   irbesartan  150 mg Oral Daily   isosorbide mononitrate  30 mg Oral Daily   magnesium oxide  200 mg Oral Daily   rosuvastatin  20 mg Oral q1800   sodium chloride flush  3 mL Intravenous Q12H   sodium chloride flush  3 mL Intravenous Q12H   cyanocobalamin  1,000 mcg Oral Daily   Continuous Infusions:  sodium chloride     sodium chloride 1 mL/kg/hr (07/28/21 0510)   heparin     PRN Meds: sodium chloride, acetaminophen, nitroGLYCERIN, ondansetron (ZOFRAN) IV, sodium chloride flush   Vital Signs    Vitals:   07/27/21 2057 07/28/21 0118 07/28/21 0402 07/28/21 0929  BP: 135/73 133/75 (!) 154/81 (!) 154/72  Pulse: (!) 51 (!) 49 (!) 46 (!) 51  Resp: '16 20 20 18  '$ Temp: 98.4 F (36.9 C) 97.7 F (36.5 C) 98 F (36.7 C)   TempSrc: Oral Oral Oral   SpO2: 97% 96% 96%   Weight:   91.2 kg   Height:        Intake/Output Summary (Last 24 hours) at 07/28/2021 1009 Last data filed at 07/28/2021 0300 Gross per 24 hour  Intake 978.89 ml  Output 875 ml  Net 103.89 ml   Last 3 Weights 07/28/2021 07/27/2021 07/27/2021  Weight (lbs) 201 lb 199 lb 199 lb  Weight (kg) 91.173 kg 90.266 kg 90.266 kg      Telemetry    Sinus bradycardia in 50s- Personally Reviewed  ECG    Sinus bradycardia, non specific T wave abnormality  - Personally Reviewed  Physical Exam   GEN: No acute distress.   Neck: No JVD Cardiac: RRR, no murmurs, rubs, or gallops.  Respiratory: Clear to auscultation bilaterally. GI: Soft, nontender, non-distended  MS: No edema; No deformity. Neuro:  Nonfocal  Psych: Normal affect   Labs    High  Sensitivity Troponin:   Recent Labs  Lab 07/26/21 0955 07/26/21 1249 07/26/21 1457 07/26/21 1705  TROPONINIHS 44* 55* 64* 66*     Chemistry Recent Labs  Lab 07/26/21 0955 07/27/21 0246  NA 138 140  K 4.4 3.8  CL 107 106  CO2 25 24  GLUCOSE 107* 100*  BUN 11 13  CREATININE 1.16 1.15  CALCIUM 9.4 8.9  MG  --  2.3  GFRNONAA >60 >60  ANIONGAP 6 10    Lipids  Recent Labs  Lab 07/27/21 0246  CHOL 217*  TRIG 96  HDL 40*  LDLCALC 158*  CHOLHDL 5.4    Hematology Recent Labs  Lab 07/26/21 0955 07/27/21 0246 07/28/21 0501  WBC 10.6* 4.9 6.3  RBC 5.55 5.37 5.36  HGB 15.3 15.0 14.7  HCT 46.9 44.5 45.5  MCV 84.5 82.9 84.9  MCH 27.6 27.9 27.4  MCHC 32.6 33.7 32.3  RDW 13.6 13.8 13.8  PLT 321 328 337    Radiology    ECHOCARDIOGRAM COMPLETE  Result Date: 07/27/2021    ECHOCARDIOGRAM REPORT   Patient Name:   Alexander Quinn Date of Exam: 07/27/2021 Medical Rec #:  [id number]  Height:       69.0 in Accession #:    [id number]       Weight:       199.0 lb Date of Birth:  02-07-1956         BSA:          2.062 m Patient Age:    65 years         BP:           159/90 mmHg Patient Gender: M                HR:           49 bpm. Exam Location:  Inpatient Procedure: 2D Echo, Color Doppler and Cardiac Doppler Indications:    Syncope R55  History:        Patient has no prior history of Echocardiogram examinations.                 Signs/Symptoms:Syncope; Risk Factors:Hypertension, Dyslipidemia                 and Former Smoker. Elevated Troponin.  Sonographer:    Leavy Cella RDCS Referring Phys: [id number] [place]  1. Left ventricular ejection fraction, by estimation, is 60 to 65%. The left ventricle has normal function. The left ventricle has no regional wall motion abnormalities. There is mild concentric left ventricular hypertrophy. Left ventricular diastolic parameters were normal.  2. Right ventricular systolic function is normal. The right ventricular size  is normal. Tricuspid regurgitation signal is inadequate for assessing PA pressure.  3. The mitral valve is grossly normal. Trivial mitral valve regurgitation. No evidence of mitral stenosis.  4. The aortic valve is tricuspid. Aortic valve regurgitation is not visualized. No aortic stenosis is present.  5. The inferior vena cava is normal in size with greater than 50% respiratory variability, suggesting right atrial pressure of 3 mmHg. Conclusion(s)/Recommendation(s): Normal biventricular function without evidence of hemodynamically significant valvular heart disease. FINDINGS  Left Ventricle: Left ventricular ejection fraction, by estimation, is 60 to 65%. The left ventricle has normal function. The left ventricle has no regional wall motion abnormalities. The left ventricular internal cavity size was normal in size. There is  mild concentric left ventricular hypertrophy. Left ventricular diastolic parameters were normal. Right Ventricle: The right ventricular size is normal. No increase in right ventricular wall thickness. Right ventricular systolic function is normal. Tricuspid regurgitation signal is inadequate for assessing PA pressure. Left Atrium: Left atrial size was normal in size. Right Atrium: Right atrial size was normal in size. Pericardium: Trivial pericardial effusion is present. Mitral Valve: The mitral valve is grossly normal. Trivial mitral valve regurgitation. No evidence of mitral valve stenosis. Tricuspid Valve: The tricuspid valve is grossly normal. Tricuspid valve regurgitation is trivial. No evidence of tricuspid stenosis. Aortic Valve: The aortic valve is tricuspid. Aortic valve regurgitation is not visualized. No aortic stenosis is present. Pulmonic Valve: The pulmonic valve was grossly normal. Pulmonic valve regurgitation is trivial. No evidence of pulmonic stenosis. Aorta: The aortic root and ascending aorta are structurally normal, with no evidence of dilitation. Venous: The inferior vena  cava is normal in size with greater than 50% respiratory variability, suggesting right atrial pressure of 3 mmHg. IAS/Shunts: The atrial septum is grossly normal.  LEFT VENTRICLE PLAX 2D LVIDd:         4.20 cm  Diastology LVIDs:         3.00 cm  LV e' medial:    6.96  cm/s LV PW:         1.26 cm  LV E/e' medial:  10.2 LV IVS:        1.18 cm  LV e' lateral:   8.70 cm/s LVOT diam:     2.10 cm  LV E/e' lateral: 8.2 LVOT Area:     3.46 cm  RIGHT VENTRICLE RV Basal diam:  3.80 cm RV Mid diam:    3.40 cm RV S prime:     17.50 cm/s TAPSE (M-mode): 2.5 cm LEFT ATRIUM             Index       RIGHT ATRIUM          Index LA diam:        4.00 cm 1.94 cm/m  RA Area:     9.41 cm LA Vol (A2C):   68.4 ml 33.18 ml/m RA Volume:   16.50 ml 8.00 ml/m LA Vol (A4C):   45.0 ml 21.83 ml/m LA Biplane Vol: 56.1 ml 27.21 ml/m   AORTA Ao Root diam: 3.30 cm MITRAL VALVE MV Area (PHT): 2.50 cm    SHUNTS MV Decel Time: 304 msec    Systemic Diam: 2.10 cm MV E velocity: 71.30 cm/s MV A velocity: 85.90 cm/s MV E/A ratio:  0.83 Eleonore Chiquito MD Electronically signed by Eleonore Chiquito MD Signature Date/Time: 07/27/2021/3:03:09 PM    Final     Cardiac Studies   Echo 07/27/21 1. Left ventricular ejection fraction, by estimation, is 60 to 65%. The  left ventricle has normal function. The left ventricle has no regional  wall motion abnormalities. There is mild concentric left ventricular  hypertrophy. Left ventricular diastolic  parameters were normal.   2. Right ventricular systolic function is normal. The right ventricular  size is normal. Tricuspid regurgitation signal is inadequate for assessing  PA pressure.   3. The mitral valve is grossly normal. Trivial mitral valve  regurgitation. No evidence of mitral stenosis.   4. The aortic valve is tricuspid. Aortic valve regurgitation is not  visualized. No aortic stenosis is present.   5. The inferior vena cava is normal in size with greater than 50%  respiratory variability,  suggesting right atrial pressure of 3 mmHg.   Conclusion(s)/Recommendation(s): Normal biventricular function without  evidence of hemodynamically significant valvular heart disease  Patient Profile     65 y.o. male  with a past medical history of HTN, HLD and obesity who is being seen for the evaluation of syncope and elevated troponin values at the request of Dr. Tomi Bamberger.   He reports being physically active at baseline as he has a garden and also enjoys playing basketball at the [facility]. he initially felt in his normal state of health while playing basketball but started to develop worsening dyspnea and associated dizziness. He walked over to a chair and says he "slumped over". Someone who works at [employer] came to him and he quickly came back to consciousness.   Assessment & Plan    Exertional dyspnea and syncope - ? True LOC based on story. Troponin 44>>55>>64>>66. Symptoms could be anginal equivalent. Echo with normal LVEF of 60-65%, no regional WM abnormality. No valvular abnormality. Pending cath today.  - HR in 50s, not on AV blocking agent prior to admit (avoid AV blocking agent) - ? Driving restriction - Likely needs monitor at discharge   2. HTN - BP improving on current meds - Continue Avapro '150mg'$  qd - Added Amlodipine '10mg'$  qd this amdission  For questions or updates, please contact [place] Please consult [email or web address] for contact info under        Signed, Leanor Kail, PA  07/28/2021, 10:09 AM     I have examined the patient and reviewed assessment and plan and discussed with patient.  Agree with above as stated.  Mildly elevated troponin with near syncope at least if not true syncope.  Plan for catheterization given elevated troponin.  Exertional shortness of breath as well.  Further plans based on catheterization.  All questions about catheterization answered.  Medical therapy for hypertension as well.  Healthy diet, avoid processed  foods.  Hyperlipidemia: LDL 158.  Now on rosuvastatin, high intensity at 20 mg daily.  Had trouble with simvastatin in the past but I think the benefits of lipid lowering therapy outweigh the risks at this point.  Larae Grooms

## 2021-07-28 NOTE — Progress Notes (Signed)
TCTS consulted for CABG evaluation. °

## 2021-07-28 NOTE — Progress Notes (Signed)
ANTICOAGULATION CONSULT NOTE - Follow Up Consult  Pharmacy Consult for heparin Indication: chest pain/ACS  Labs: Recent Labs    07/26/21 0955 07/26/21 1249 07/26/21 1457 07/26/21 1705 07/27/21 0246 07/27/21 1041 07/27/21 1806 07/28/21 0501 07/28/21 1323  HGB 15.3  --   --   --  15.0  --   --  14.7  --   HCT 46.9  --   --   --  44.5  --   --  45.5  --   PLT 321  --   --   --  328  --   --  337  --   HEPARINUNFRC  --   --   --   --  0.10*   < > >1.10* >1.10* 0.47  CREATININE 1.16  --   --   --  1.15  --   --   --   --   TROPONINIHS 44* 55* 64* 66*  --   --   --   --   --    < > = values in this interval not displayed.     Assessment: 65yo male remains supratherapeutic on heparin after rate change; no infusion issues or signs of bleeding per RN.  9/19 LHC 9/19 ~1530 Sheath removed Previous heparin level 0.47 on 1000 units/hr  Goal of Therapy:  Heparin level 0.3-0.7 units/ml   Plan:  Will restart heparin 8 hours after sheath removed Plan for heparin 1000 units/hr @2330  Obtain heparin level with am labs to guide further therapy Daily heparin level and CBC ordered.   Donnald Garre, PharmD Clinical Pharmacist  Please check AMION for all [place] numbers After 10:00 PM, call [place] [phone]

## 2021-07-28 NOTE — Interval H&P Note (Signed)
Cath Lab Visit (complete for each Cath Lab visit)  Clinical Evaluation Leading to the Procedure:   ACS: Yes.    Non-ACS:    Anginal Classification: CCS IV  Anti-ischemic medical therapy: Minimal Therapy (1 class of medications)  Non-Invasive Test Results: No non-invasive testing performed  Prior CABG: No previous CABG      History and Physical Interval Note:  07/28/2021 2:40 PM  Alexander Quinn  has presented today for surgery, with the diagnosis of unstable angina.  The various methods of treatment have been discussed with the patient and family. After consideration of risks, benefits and other options for treatment, the patient has consented to  Procedure(s): LEFT HEART CATH AND CORONARY ANGIOGRAPHY (N/A) as a surgical intervention.  The patient's history has been reviewed, patient examined, no change in status, stable for surgery.  I have reviewed the patient's chart and labs.  Questions were answered to the patient's satisfaction.     Larae Grooms

## 2021-07-28 NOTE — H&P (View-Only) (Signed)
Progress Note  Patient Name: Alexander Quinn Date of Encounter: 07/28/2021  [facility] Cardiologist: None [facility] but like to follow up with [place]  Subjective   Feeling well. No chest pain, sob or palpitations.    Inpatient Medications    Scheduled Meds:  amLODipine  10 mg Oral Daily   aspirin EC  81 mg Oral Daily   cholecalciferol  1,000 Units Oral Daily   irbesartan  150 mg Oral Daily   isosorbide mononitrate  30 mg Oral Daily   magnesium oxide  200 mg Oral Daily   rosuvastatin  20 mg Oral q1800   sodium chloride flush  3 mL Intravenous Q12H   sodium chloride flush  3 mL Intravenous Q12H   cyanocobalamin  1,000 mcg Oral Daily   Continuous Infusions:  sodium chloride     sodium chloride 1 mL/kg/hr (07/28/21 0510)   heparin     PRN Meds: sodium chloride, acetaminophen, nitroGLYCERIN, ondansetron (ZOFRAN) IV, sodium chloride flush   Vital Signs    Vitals:   07/27/21 2057 07/28/21 0118 07/28/21 0402 07/28/21 0929  BP: 135/73 133/75 (!) 154/81 (!) 154/72  Pulse: (!) 51 (!) 49 (!) 46 (!) 51  Resp: '16 20 20 18  '$ Temp: 98.4 F (36.9 C) 97.7 F (36.5 C) 98 F (36.7 C)   TempSrc: Oral Oral Oral   SpO2: 97% 96% 96%   Weight:   91.2 kg   Height:        Intake/Output Summary (Last 24 hours) at 07/28/2021 1009 Last data filed at 07/28/2021 0300 Gross per 24 hour  Intake 978.89 ml  Output 875 ml  Net 103.89 ml   Last 3 Weights 07/28/2021 07/27/2021 07/27/2021  Weight (lbs) 201 lb 199 lb 199 lb  Weight (kg) 91.173 kg 90.266 kg 90.266 kg      Telemetry    Sinus bradycardia in 50s- Personally Reviewed  ECG    Sinus bradycardia, non specific T wave abnormality  - Personally Reviewed  Physical Exam   GEN: No acute distress.   Neck: No JVD Cardiac: RRR, no murmurs, rubs, or gallops.  Respiratory: Clear to auscultation bilaterally. GI: Soft, nontender, non-distended  MS: No edema; No deformity. Neuro:  Nonfocal  Psych: Normal affect   Labs    High  Sensitivity Troponin:   Recent Labs  Lab 07/26/21 0955 07/26/21 1249 07/26/21 1457 07/26/21 1705  TROPONINIHS 44* 55* 64* 66*     Chemistry Recent Labs  Lab 07/26/21 0955 07/27/21 0246  NA 138 140  K 4.4 3.8  CL 107 106  CO2 25 24  GLUCOSE 107* 100*  BUN 11 13  CREATININE 1.16 1.15  CALCIUM 9.4 8.9  MG  --  2.3  GFRNONAA >60 >60  ANIONGAP 6 10    Lipids  Recent Labs  Lab 07/27/21 0246  CHOL 217*  TRIG 96  HDL 40*  LDLCALC 158*  CHOLHDL 5.4    Hematology Recent Labs  Lab 07/26/21 0955 07/27/21 0246 07/28/21 0501  WBC 10.6* 4.9 6.3  RBC 5.55 5.37 5.36  HGB 15.3 15.0 14.7  HCT 46.9 44.5 45.5  MCV 84.5 82.9 84.9  MCH 27.6 27.9 27.4  MCHC 32.6 33.7 32.3  RDW 13.6 13.8 13.8  PLT 321 328 337    Radiology    ECHOCARDIOGRAM COMPLETE  Result Date: 07/27/2021    ECHOCARDIOGRAM REPORT   Patient Name:   Alexander Quinn Date of Exam: 07/27/2021 Medical Rec #:  [id number]  Height:       69.0 in Accession #:    [id number]       Weight:       199.0 lb Date of Birth:  29-Apr-1956         BSA:          2.062 m Patient Age:    65 years         BP:           159/90 mmHg Patient Gender: M                HR:           49 bpm. Exam Location:  Inpatient Procedure: 2D Echo, Color Doppler and Cardiac Doppler Indications:    Syncope R55  History:        Patient has no prior history of Echocardiogram examinations.                 Signs/Symptoms:Syncope; Risk Factors:Hypertension, Dyslipidemia                 and Former Smoker. Elevated Troponin.  Sonographer:    Leavy Cella RDCS Referring Phys: [id number] [place]  1. Left ventricular ejection fraction, by estimation, is 60 to 65%. The left ventricle has normal function. The left ventricle has no regional wall motion abnormalities. There is mild concentric left ventricular hypertrophy. Left ventricular diastolic parameters were normal.  2. Right ventricular systolic function is normal. The right ventricular size  is normal. Tricuspid regurgitation signal is inadequate for assessing PA pressure.  3. The mitral valve is grossly normal. Trivial mitral valve regurgitation. No evidence of mitral stenosis.  4. The aortic valve is tricuspid. Aortic valve regurgitation is not visualized. No aortic stenosis is present.  5. The inferior vena cava is normal in size with greater than 50% respiratory variability, suggesting right atrial pressure of 3 mmHg. Conclusion(s)/Recommendation(s): Normal biventricular function without evidence of hemodynamically significant valvular heart disease. FINDINGS  Left Ventricle: Left ventricular ejection fraction, by estimation, is 60 to 65%. The left ventricle has normal function. The left ventricle has no regional wall motion abnormalities. The left ventricular internal cavity size was normal in size. There is  mild concentric left ventricular hypertrophy. Left ventricular diastolic parameters were normal. Right Ventricle: The right ventricular size is normal. No increase in right ventricular wall thickness. Right ventricular systolic function is normal. Tricuspid regurgitation signal is inadequate for assessing PA pressure. Left Atrium: Left atrial size was normal in size. Right Atrium: Right atrial size was normal in size. Pericardium: Trivial pericardial effusion is present. Mitral Valve: The mitral valve is grossly normal. Trivial mitral valve regurgitation. No evidence of mitral valve stenosis. Tricuspid Valve: The tricuspid valve is grossly normal. Tricuspid valve regurgitation is trivial. No evidence of tricuspid stenosis. Aortic Valve: The aortic valve is tricuspid. Aortic valve regurgitation is not visualized. No aortic stenosis is present. Pulmonic Valve: The pulmonic valve was grossly normal. Pulmonic valve regurgitation is trivial. No evidence of pulmonic stenosis. Aorta: The aortic root and ascending aorta are structurally normal, with no evidence of dilitation. Venous: The inferior vena  cava is normal in size with greater than 50% respiratory variability, suggesting right atrial pressure of 3 mmHg. IAS/Shunts: The atrial septum is grossly normal.  LEFT VENTRICLE PLAX 2D LVIDd:         4.20 cm  Diastology LVIDs:         3.00 cm  LV e' medial:    6.96  cm/s LV PW:         1.26 cm  LV E/e' medial:  10.2 LV IVS:        1.18 cm  LV e' lateral:   8.70 cm/s LVOT diam:     2.10 cm  LV E/e' lateral: 8.2 LVOT Area:     3.46 cm  RIGHT VENTRICLE RV Basal diam:  3.80 cm RV Mid diam:    3.40 cm RV S prime:     17.50 cm/s TAPSE (M-mode): 2.5 cm LEFT ATRIUM             Index       RIGHT ATRIUM          Index LA diam:        4.00 cm 1.94 cm/m  RA Area:     9.41 cm LA Vol (A2C):   68.4 ml 33.18 ml/m RA Volume:   16.50 ml 8.00 ml/m LA Vol (A4C):   45.0 ml 21.83 ml/m LA Biplane Vol: 56.1 ml 27.21 ml/m   AORTA Ao Root diam: 3.30 cm MITRAL VALVE MV Area (PHT): 2.50 cm    SHUNTS MV Decel Time: 304 msec    Systemic Diam: 2.10 cm MV E velocity: 71.30 cm/s MV A velocity: 85.90 cm/s MV E/A ratio:  0.83 Eleonore Chiquito MD Electronically signed by Eleonore Chiquito MD Signature Date/Time: 07/27/2021/3:03:09 PM    Final     Cardiac Studies   Echo 07/27/21 1. Left ventricular ejection fraction, by estimation, is 60 to 65%. The  left ventricle has normal function. The left ventricle has no regional  wall motion abnormalities. There is mild concentric left ventricular  hypertrophy. Left ventricular diastolic  parameters were normal.   2. Right ventricular systolic function is normal. The right ventricular  size is normal. Tricuspid regurgitation signal is inadequate for assessing  PA pressure.   3. The mitral valve is grossly normal. Trivial mitral valve  regurgitation. No evidence of mitral stenosis.   4. The aortic valve is tricuspid. Aortic valve regurgitation is not  visualized. No aortic stenosis is present.   5. The inferior vena cava is normal in size with greater than 50%  respiratory variability,  suggesting right atrial pressure of 3 mmHg.   Conclusion(s)/Recommendation(s): Normal biventricular function without  evidence of hemodynamically significant valvular heart disease  Patient Profile     65 y.o. male  with a past medical history of HTN, HLD and obesity who is being seen for the evaluation of syncope and elevated troponin values at the request of Dr. Tomi Bamberger.   He reports being physically active at baseline as he has a garden and also enjoys playing basketball at the [employer]. he initially felt in his normal state of health while playing basketball but started to develop worsening dyspnea and associated dizziness. He walked over to a chair and says he "slumped over". Someone who works at [employer] came to him and he quickly came back to consciousness.   Assessment & Plan    Exertional dyspnea and syncope - ? True LOC based on story. Troponin 44>>55>>64>>66. Symptoms could be anginal equivalent. Echo with normal LVEF of 60-65%, no regional WM abnormality. No valvular abnormality. Pending cath today.  - HR in 50s, not on AV blocking agent prior to admit (avoid AV blocking agent) - ? Driving restriction - Likely needs monitor at discharge   2. HTN - BP improving on current meds - Continue Avapro '150mg'$  qd - Added Amlodipine '10mg'$  qd this amdission  For questions or updates, please contact [place] Please consult [email or web address] for contact info under        Signed, Leanor Kail, PA  07/28/2021, 10:09 AM     I have examined the patient and reviewed assessment and plan and discussed with patient.  Agree with above as stated.  Mildly elevated troponin with near syncope at least if not true syncope.  Plan for catheterization given elevated troponin.  Exertional shortness of breath as well.  Further plans based on catheterization.  All questions about catheterization answered.  Medical therapy for hypertension as well.  Healthy diet, avoid processed  foods.  Hyperlipidemia: LDL 158.  Now on rosuvastatin, high intensity at 20 mg daily.  Had trouble with simvastatin in the past but I think the benefits of lipid lowering therapy outweigh the risks at this point.  Larae Grooms

## 2021-07-28 NOTE — Progress Notes (Signed)
PROGRESS NOTE                                                                                                                                                                                                             Patient Demographics:    Alexander Quinn, is a 65 y.o. male, DOB - 06/29/1956, [id number]  Outpatient Primary MD for the patient is Luana Shu, Mike Craze, MD    LOS - 1  Admit date - 07/26/2021    Chief Complaint  Patient presents with   Loss of Consciousness       Brief Narrative (HPI from H&P)  - Alexander Quinn is a 65 y.o. male with medical history significant of HTN, HLD, BPH and obesity presenting to ED with syncopal event. He was playing basketball at the [employer] around 7:00AM. After 30 minutes he needed to sit down because he was breathing hard, came to the ER and was diagnosed with NSTEMI.   Subjective:   Patient in bed, appears comfortable, denies any headache, no fever, no chest pain or pressure, no shortness of breath , no abdominal pain. No new focal weakness.    Assessment  & Plan :     Syncope post exertion with NSTEMI - Cards following, on combination of aspirin, statin and heparin drip, avoiding beta-blocker due to resting bradycardia, plan is for left heart catheterization today on 07/28/2021, currently symptom-free.  Continue to monitor, echo stable.  2.  Dyslipidemia.  Trial of Crestor.  Reported statin allergy.  Will monitor closely.  If he has issues he will follow with cardiology outpatient for PCSK9  infusions.  3.  Hypertension.  Currently on ARB, added Norvasc for better control.  4.  History of B12 deficiency.  On replacement.        Condition - Fair  Family Communication  :  wife bedside  Code Status :  Full  Consults  :  Cards  PUD Prophylaxis :    Procedures  :     TTE - 1. Left ventricular ejection fraction, by estimation, is 60 to 65%. The left ventricle has normal  function. The left ventricle has no regional wall motion abnormalities. There is mild concentric left ventricular hypertrophy. Left ventricular diastolic parameters were normal.  2. Right ventricular systolic function is normal. The right ventricular size is normal. Tricuspid regurgitation  signal is inadequate for assessing PA pressure.  3. The mitral valve is grossly normal. Trivial mitral valve regurgitation. No evidence of mitral stenosis.  4. The aortic valve is tricuspid. Aortic valve regurgitation is not visualized. No aortic stenosis is present.  5. The inferior vena cava is normal in size with greater than 50% respiratory variability, suggesting right atrial pressure of 3 mmHg.   L. Heart Cath      Disposition Plan  :    Status is: Inpt  Dispo: The patient is from: Home              Anticipated d/c is to: Home              Patient currently is not medically stable to d/c.   Difficult to place patient No  DVT Prophylaxis  :  Hep gtt    Lab Results  Component Value Date   PLT 337 07/28/2021    Diet :  Diet Order             Diet NPO time specified Except for: Sips with Meds  Diet effective midnight                    Inpatient Medications  Scheduled Meds:  amLODipine  10 mg Oral Daily   aspirin EC  81 mg Oral Daily   cholecalciferol  1,000 Units Oral Daily   irbesartan  150 mg Oral Daily   isosorbide mononitrate  30 mg Oral Daily   magnesium oxide  200 mg Oral Daily   rosuvastatin  20 mg Oral q1800   sodium chloride flush  3 mL Intravenous Q12H   sodium chloride flush  3 mL Intravenous Q12H   cyanocobalamin  1,000 mcg Oral Daily   Continuous Infusions:  sodium chloride     sodium chloride 1 mL/kg/hr (07/28/21 0510)   heparin     PRN Meds:.sodium chloride, acetaminophen, nitroGLYCERIN, ondansetron (ZOFRAN) IV, sodium chloride flush  Antibiotics  :    Anti-infectives (From admission, onward)    None        Time Spent in minutes  30   Lala Lund M.D on 07/28/2021 at 9:32 AM  To page go to [email or web address]   [facility] -  Office  [phone]      See all Orders from today for further details    Objective:   Vitals:   07/27/21 2057 07/28/21 0118 07/28/21 0402 07/28/21 0929  BP: 135/73 133/75 (!) 154/81 (!) 154/72  Pulse: (!) 51 (!) 49 (!) 46 (!) 51  Resp: '16 20 20 18  '$ Temp: 98.4 F (36.9 C) 97.7 F (36.5 C) 98 F (36.7 C)   TempSrc: Oral Oral Oral   SpO2: 97% 96% 96%   Weight:   91.2 kg   Height:        Wt Readings from Last 3 Encounters:  07/28/21 91.2 kg  04/01/21 92.5 kg  07/29/18 93 kg     Intake/Output Summary (Last 24 hours) at 07/28/2021 0932 Last data filed at 07/28/2021 0300 Gross per 24 hour  Intake 978.89 ml  Output 875 ml  Net 103.89 ml     Physical Exam  Awake Alert, No new F.N deficits, Normal affect [place].AT,PERRAL Supple Neck,No JVD, No cervical lymphadenopathy appriciated.  Symmetrical Chest wall movement, Good air movement bilaterally, CTAB RRR,No Gallops, Rubs or new Murmurs, No Parasternal Heave +ve B.Sounds, Abd Soft, No tenderness, No organomegaly appriciated, No rebound - guarding or rigidity. No Cyanosis,  Clubbing or edema, No new Rash or bruise     Data Review:    CBC Recent Labs  Lab 07/26/21 0955 07/27/21 0246 07/28/21 0501  WBC 10.6* 4.9 6.3  HGB 15.3 15.0 14.7  HCT 46.9 44.5 45.5  PLT 321 328 337  MCV 84.5 82.9 84.9  MCH 27.6 27.9 27.4  MCHC 32.6 33.7 32.3  RDW 13.6 13.8 13.8    Recent Labs  Lab 07/26/21 0955 07/27/21 0246  NA 138 140  K 4.4 3.8  CL 107 106  CO2 25 24  GLUCOSE 107* 100*  BUN 11 13  CREATININE 1.16 1.15  CALCIUM 9.4 8.9  MG  --  2.3  HGBA1C  --  5.5    ------------------------------------------------------------------------------------------------------------------ Recent Labs    07/27/21 0246  CHOL 217*  HDL 40*  LDLCALC 158*  TRIG 96  CHOLHDL 5.4    Lab Results  Component Value Date   HGBA1C 5.5  07/27/2021   ------------------------------------------------------------------------------------------------------------------ No results for input(s): TSH, T4TOTAL, T3FREE, THYROIDAB in the last 72 hours.  Invalid input(s): FREET3  Cardiac Enzymes No results for input(s): CKMB, TROPONINI, MYOGLOBIN in the last 168 hours.  Invalid input(s): CK ------------------------------------------------------------------------------------------------------------------    Component Value Date/Time   BNP CANCELED 05/06/2020 0855     Radiology Reports ECHOCARDIOGRAM COMPLETE  Result Date: 07/27/2021    ECHOCARDIOGRAM REPORT   Patient Name:   Alexander Quinn Date of Exam: 07/27/2021 Medical Rec #:  [id number]        Height:       69.0 in Accession #:    [id number]       Weight:       199.0 lb Date of Birth:  Sep 27, 1956         BSA:          2.062 m Patient Age:    78 years         BP:           159/90 mmHg Patient Gender: M                HR:           49 bpm. Exam Location:  Inpatient Procedure: 2D Echo, Color Doppler and Cardiac Doppler Indications:    Syncope R55  History:        Patient has no prior history of Echocardiogram examinations.                 Signs/Symptoms:Syncope; Risk Factors:Hypertension, Dyslipidemia                 and Former Smoker. Elevated Troponin.  Sonographer:    Leavy Cella RDCS Referring Phys: [id number] [place]  1. Left ventricular ejection fraction, by estimation, is 60 to 65%. The left ventricle has normal function. The left ventricle has no regional wall motion abnormalities. There is mild concentric left ventricular hypertrophy. Left ventricular diastolic parameters were normal.  2. Right ventricular systolic function is normal. The right ventricular size is normal. Tricuspid regurgitation signal is inadequate for assessing PA pressure.  3. The mitral valve is grossly normal. Trivial mitral valve regurgitation. No evidence of mitral stenosis.  4. The  aortic valve is tricuspid. Aortic valve regurgitation is not visualized. No aortic stenosis is present.  5. The inferior vena cava is normal in size with greater than 50% respiratory variability, suggesting right atrial pressure of 3 mmHg. Conclusion(s)/Recommendation(s): Normal biventricular function without evidence of hemodynamically significant valvular heart disease. FINDINGS  Left  Ventricle: Left ventricular ejection fraction, by estimation, is 60 to 65%. The left ventricle has normal function. The left ventricle has no regional wall motion abnormalities. The left ventricular internal cavity size was normal in size. There is  mild concentric left ventricular hypertrophy. Left ventricular diastolic parameters were normal. Right Ventricle: The right ventricular size is normal. No increase in right ventricular wall thickness. Right ventricular systolic function is normal. Tricuspid regurgitation signal is inadequate for assessing PA pressure. Left Atrium: Left atrial size was normal in size. Right Atrium: Right atrial size was normal in size. Pericardium: Trivial pericardial effusion is present. Mitral Valve: The mitral valve is grossly normal. Trivial mitral valve regurgitation. No evidence of mitral valve stenosis. Tricuspid Valve: The tricuspid valve is grossly normal. Tricuspid valve regurgitation is trivial. No evidence of tricuspid stenosis. Aortic Valve: The aortic valve is tricuspid. Aortic valve regurgitation is not visualized. No aortic stenosis is present. Pulmonic Valve: The pulmonic valve was grossly normal. Pulmonic valve regurgitation is trivial. No evidence of pulmonic stenosis. Aorta: The aortic root and ascending aorta are structurally normal, with no evidence of dilitation. Venous: The inferior vena cava is normal in size with greater than 50% respiratory variability, suggesting right atrial pressure of 3 mmHg. IAS/Shunts: The atrial septum is grossly normal.  LEFT VENTRICLE PLAX 2D LVIDd:          4.20 cm  Diastology LVIDs:         3.00 cm  LV e' medial:    6.96 cm/s LV PW:         1.26 cm  LV E/e' medial:  10.2 LV IVS:        1.18 cm  LV e' lateral:   8.70 cm/s LVOT diam:     2.10 cm  LV E/e' lateral: 8.2 LVOT Area:     3.46 cm  RIGHT VENTRICLE RV Basal diam:  3.80 cm RV Mid diam:    3.40 cm RV S prime:     17.50 cm/s TAPSE (M-mode): 2.5 cm LEFT ATRIUM             Index       RIGHT ATRIUM          Index LA diam:        4.00 cm 1.94 cm/m  RA Area:     9.41 cm LA Vol (A2C):   68.4 ml 33.18 ml/m RA Volume:   16.50 ml 8.00 ml/m LA Vol (A4C):   45.0 ml 21.83 ml/m LA Biplane Vol: 56.1 ml 27.21 ml/m   AORTA Ao Root diam: 3.30 cm MITRAL VALVE MV Area (PHT): 2.50 cm    SHUNTS MV Decel Time: 304 msec    Systemic Diam: 2.10 cm MV E velocity: 71.30 cm/s MV A velocity: 85.90 cm/s MV E/A ratio:  0.83 Eleonore Chiquito MD Electronically signed by Eleonore Chiquito MD Signature Date/Time: 07/27/2021/3:03:09 PM    Final

## 2021-07-28 NOTE — Plan of Care (Signed)
  Problem: Clinical Measurements: Goal: Respiratory complications will improve Outcome: Progressing   

## 2021-07-29 DIAGNOSIS — E785 Hyperlipidemia, unspecified: Secondary | ICD-10-CM

## 2021-07-29 DIAGNOSIS — R778 Other specified abnormalities of plasma proteins: Secondary | ICD-10-CM | POA: Diagnosis not present

## 2021-07-29 DIAGNOSIS — R55 Syncope and collapse: Secondary | ICD-10-CM | POA: Diagnosis not present

## 2021-07-29 DIAGNOSIS — I2510 Atherosclerotic heart disease of native coronary artery without angina pectoris: Secondary | ICD-10-CM

## 2021-07-29 DIAGNOSIS — I214 Non-ST elevation (NSTEMI) myocardial infarction: Secondary | ICD-10-CM

## 2021-07-29 DIAGNOSIS — I10 Essential (primary) hypertension: Secondary | ICD-10-CM

## 2021-07-29 DIAGNOSIS — E782 Mixed hyperlipidemia: Secondary | ICD-10-CM | POA: Diagnosis not present

## 2021-07-29 LAB — BASIC METABOLIC PANEL
Anion gap: 9 (ref 5–15)
BUN: 10 mg/dL (ref 8–23)
CO2: 22 mmol/L (ref 22–32)
Calcium: 9 mg/dL (ref 8.9–10.3)
Chloride: 107 mmol/L (ref 98–111)
Creatinine, Ser: 1.02 mg/dL (ref 0.61–1.24)
GFR, Estimated: >60 mL/min (ref >60)
Glucose, Bld: 96 mg/dL (ref 70–99)
Potassium: 3.9 mmol/L (ref 3.5–5.1)
Sodium: 138 mmol/L (ref 135–145)

## 2021-07-29 LAB — CBC
HCT: 41.8 % (ref 39.0–52.0)
Hemoglobin: 13.7 g/dL (ref 13.0–17.0)
MCH: 27.7 pg (ref 26.0–34.0)
MCHC: 32.8 g/dL (ref 30.0–36.0)
MCV: 84.4 fL (ref 80.0–100.0)
Platelets: 330 10*3/uL (ref 150–400)
RBC: 4.95 MIL/uL (ref 4.22–5.81)
RDW: 13.8 % (ref 11.5–15.5)
WBC: 6.1 10*3/uL (ref 4.0–10.5)
nRBC: 0 % (ref 0.0–0.2)

## 2021-07-29 LAB — HEPARIN LEVEL (UNFRACTIONATED)
Heparin Unfractionated: 0.16 IU/mL — ABNORMAL LOW (ref 0.30–0.70)
Heparin Unfractionated: 0.45 IU/mL (ref 0.30–0.70)
Heparin Unfractionated: 0.45 IU/mL (ref 0.30–0.70)

## 2021-07-29 MED ORDER — ISOSORBIDE MONONITRATE ER 60 MG PO TB24
60.0000 mg | ORAL_TABLET | Freq: Every day | ORAL | Status: DC
  Administered 2021-07-30: 60 mg via ORAL
  Filled 2021-07-29: qty 1

## 2021-07-29 MED ORDER — ROSUVASTATIN CALCIUM 20 MG PO TABS
40.0000 mg | ORAL_TABLET | Freq: Every day | ORAL | Status: DC
  Administered 2021-07-29 – 2021-07-30 (×2): 40 mg via ORAL
  Filled 2021-07-29 (×2): qty 2

## 2021-07-29 MED ORDER — ISOSORBIDE MONONITRATE ER 30 MG PO TB24
30.0000 mg | ORAL_TABLET | Freq: Once | ORAL | Status: AC
  Administered 2021-07-29: 30 mg via ORAL
  Filled 2021-07-29: qty 1

## 2021-07-29 NOTE — Plan of Care (Signed)
  Problem: Clinical Measurements: Goal: Will remain free from infection Outcome: Progressing   Problem: Activity: Goal: Risk for activity intolerance will decrease Outcome: Progressing   

## 2021-07-29 NOTE — Progress Notes (Signed)
PROGRESS NOTE                                                                                                                                                                                                             Patient Demographics:    Alexander Quinn, is a 65 y.o. male, DOB - [birth year], SAY:[id number]  Outpatient Primary MD for the patient is Luana Shu, Mike Craze, MD    LOS - 2  Admit date - 07/26/2021    Chief Complaint  Patient presents with   Loss of Consciousness       Brief Narrative (HPI from H&P)  - Alexander Quinn is a 65 y.o. male with medical history significant of HTN, HLD, BPH and obesity presenting to ED with syncopal event. He was playing basketball at the [employer] around 7:00AM. After 30 minutes he needed to sit down because he was breathing hard, came to the ER and was diagnosed with NSTEMI.   Subjective:   Patient in bed, appears comfortable, denies any headache, no fever, no chest pain or pressure, no shortness of breath , no abdominal pain. No new focal weakness.   Assessment  & Plan :     Syncope post exertion with NSTEMI - Cards following, on combination of aspirin, statin and heparin drip, avoiding beta-blocker due to resting bradycardia, echocardiogram was stable however he underwent left heart catheterization on 07/28/2021 showing severe three-vessel disease for which cardiothoracic surgery has been consulted for possible CABG, currently symptom-free.  2.  Dyslipidemia.  Trial of Crestor.  Reported statin allergy.  Will monitor closely.  If he has issues he will follow with cardiology outpatient for PCSK9  infusions.  3.  Hypertension.  Currently on ARB, added Norvasc for better control.  4.  History of B12 deficiency.  On replacement.        Condition - Fair  Family Communication  :  wife bedside 07/28/21  Code Status :  Full  Consults  :  Cards, CVTS  PUD Prophylaxis :    Procedures  :      TTE - 1. Left ventricular ejection fraction, by estimation, is 60 to 65%. The left ventricle has normal function. The left ventricle has no regional wall motion abnormalities. There is mild concentric left ventricular hypertrophy. Left ventricular diastolic parameters were normal.  2. Right ventricular systolic  function is normal. The right ventricular size is normal. Tricuspid regurgitation signal is inadequate for assessing PA pressure.  3. The mitral valve is grossly normal. Trivial mitral valve regurgitation. No evidence of mitral stenosis.  4. The aortic valve is tricuspid. Aortic valve regurgitation is not visualized. No aortic stenosis is present.  5. The inferior vena cava is normal in size with greater than 50% respiratory variability, suggesting right atrial pressure of 3 mmHg.   L. Heart Cath - 07/28/21    Mid LM lesion is 30% stenosed.   RPDA lesion is 75% stenosed.   RPAV lesion is 80% stenosed.   Prox Cx to Mid Cx lesion is 90% stenosed.   Mid LAD-2 lesion is 99% stenosed.   Mid LAD-1 lesion is 50% stenosed.   Dist RCA lesion is 50% stenosed.   The left ventricular systolic function is normal.   LV end diastolic pressure is normal.   The left ventricular ejection fraction is 55-65% by visual estimate.   There is no aortic valve stenosis.   Severe three vessel CAD. Mid LAD subtotal occlusion at a large diagonal branch.  Proximal to mid circumflex lesion and distal RCA and branch vessel disease.  Plan for cardiac surgery consult.        Disposition Plan  :    Status is: Inpt  Dispo: The patient is from: Home              Anticipated d/c is to: Home              Patient currently is not medically stable to d/c.   Difficult to place patient No  DVT Prophylaxis  :  Hep gtt    Lab Results  Component Value Date   PLT 330 07/29/2021    Diet :  Diet Order             Diet Heart Room service appropriate? Yes; Fluid consistency: Thin  Diet effective now                     Inpatient Medications  Scheduled Meds:  amLODipine  10 mg Oral Daily   aspirin EC  81 mg Oral Daily   cholecalciferol  1,000 Units Oral Daily   irbesartan  150 mg Oral Daily   isosorbide mononitrate  30 mg Oral Daily   magnesium oxide  200 mg Oral Daily   rosuvastatin  20 mg Oral q1800   sodium chloride flush  3 mL Intravenous Q12H   cyanocobalamin  1,000 mcg Oral Daily   Continuous Infusions:  sodium chloride     heparin 1,200 Units/hr (07/29/21 0512)   PRN Meds:.sodium chloride, acetaminophen, nitroGLYCERIN, ondansetron (ZOFRAN) IV, sodium chloride flush  Antibiotics  :    Anti-infectives (From admission, onward)    None        Time Spent in minutes  30   Lala Lund M.D on 07/29/2021 at 9:07 AM  To page go to [email or web address]   [facility] -  Office  [phone]      See all Orders from today for further details    Objective:   Vitals:   07/29/21 0216 07/29/21 0220 07/29/21 0411 07/29/21 0844  BP: 127/71 (!) 144/78 (!) 110/55 135/62  Pulse: (!) 55 (!) 52 (!) 44 (!) 51  Resp:   20 18  Temp:   98 F (36.7 C)   TempSrc:   Oral   SpO2:   98%   Weight:   91.5  kg   Height:        Wt Readings from Last 3 Encounters:  07/29/21 91.5 kg  04/01/21 92.5 kg  07/29/18 93 kg     Intake/Output Summary (Last 24 hours) at 07/29/2021 0907 Last data filed at 07/29/2021 0600 Gross per 24 hour  Intake 539.25 ml  Output 1575 ml  Net -1035.75 ml     Physical Exam  Awake Alert, No new F.N deficits, Normal affect [place].AT,PERRAL Supple Neck,No JVD, No cervical lymphadenopathy appriciated.  Symmetrical Chest wall movement, Good air movement bilaterally, CTAB RRR,No Gallops, Rubs or new Murmurs, No Parasternal Heave +ve B.Sounds, Abd Soft, No tenderness, No organomegaly appriciated, No rebound - guarding or rigidity. No Cyanosis, Clubbing or edema, No new Rash or bruise     Data Review:    CBC Recent Labs  Lab 07/26/21 0955  07/27/21 0246 07/28/21 0501 07/29/21 0337  WBC 10.6* 4.9 6.3 6.1  HGB 15.3 15.0 14.7 13.7  HCT 46.9 44.5 45.5 41.8  PLT 321 328 337 330  MCV 84.5 82.9 84.9 84.4  MCH 27.6 27.9 27.4 27.7  MCHC 32.6 33.7 32.3 32.8  RDW 13.6 13.8 13.8 13.8    Recent Labs  Lab 07/26/21 0955 07/27/21 0246 07/29/21 0337  NA 138 140 138  K 4.4 3.8 3.9  CL 107 106 107  CO2 25 24 22   GLUCOSE 107* 100* 96  BUN 11 13 10   CREATININE 1.16 1.15 1.02  CALCIUM 9.4 8.9 9.0  MG  --  2.3  --   HGBA1C  --  5.5  --     ------------------------------------------------------------------------------------------------------------------ Recent Labs    07/27/21 0246  CHOL 217*  HDL 40*  LDLCALC 158*  TRIG 96  CHOLHDL 5.4    Lab Results  Component Value Date   HGBA1C 5.5 07/27/2021   ------------------------------------------------------------------------------------------------------------------ No results for input(s): TSH, T4TOTAL, T3FREE, THYROIDAB in the last 72 hours.  Invalid input(s): FREET3  Cardiac Enzymes No results for input(s): CKMB, TROPONINI, MYOGLOBIN in the last 168 hours.  Invalid input(s): CK ------------------------------------------------------------------------------------------------------------------    Component Value Date/Time   BNP CANCELED 05/06/2020 0855     Radiology Reports CARDIAC CATHETERIZATION  Result Date: 07/28/2021   Mid LM lesion is 30% stenosed.   RPDA lesion is 75% stenosed.   RPAV lesion is 80% stenosed.   Prox Cx to Mid Cx lesion is 90% stenosed.   Mid LAD-2 lesion is 99% stenosed.   Mid LAD-1 lesion is 50% stenosed.   Dist RCA lesion is 50% stenosed.   The left ventricular systolic function is normal.   LV end diastolic pressure is normal.   The left ventricular ejection fraction is 55-65% by visual estimate.   There is no aortic valve stenosis. Severe three vessel CAD. Mid LAD subtotal occlusion at a large diagonal branch.  Proximal to mid circumflex  lesion and distal RCA and branch vessel disease.  Plan for cardiac surgery consult.   ECHOCARDIOGRAM COMPLETE  Result Date: 07/27/2021    ECHOCARDIOGRAM REPORT   Patient Name:   Alexander Quinn Date of Exam: 07/27/2021 Medical Rec #:  [id number]        Height:       69.0 in Accession #:    [id number]       Weight:       199.0 lb Date of Birth:  1956/01/16         BSA:          2.062 m Patient Age:  65 years         BP:           159/90 mmHg Patient Gender: M                HR:           49 bpm. Exam Location:  Inpatient Procedure: 2D Echo, Color Doppler and Cardiac Doppler Indications:    Syncope R55  History:        Patient has no prior history of Echocardiogram examinations.                 Signs/Symptoms:Syncope; Risk Factors:Hypertension, Dyslipidemia                 and Former Smoker. Elevated Troponin.  Sonographer:    Leavy Cella RDCS Referring Phys: [id number] [facility]  1. Left ventricular ejection fraction, by estimation, is 60 to 65%. The left ventricle has normal function. The left ventricle has no regional wall motion abnormalities. There is mild concentric left ventricular hypertrophy. Left ventricular diastolic parameters were normal.  2. Right ventricular systolic function is normal. The right ventricular size is normal. Tricuspid regurgitation signal is inadequate for assessing PA pressure.  3. The mitral valve is grossly normal. Trivial mitral valve regurgitation. No evidence of mitral stenosis.  4. The aortic valve is tricuspid. Aortic valve regurgitation is not visualized. No aortic stenosis is present.  5. The inferior vena cava is normal in size with greater than 50% respiratory variability, suggesting right atrial pressure of 3 mmHg. Conclusion(s)/Recommendation(s): Normal biventricular function without evidence of hemodynamically significant valvular heart disease. FINDINGS  Left Ventricle: Left ventricular ejection fraction, by estimation, is 60 to 65%. The left  ventricle has normal function. The left ventricle has no regional wall motion abnormalities. The left ventricular internal cavity size was normal in size. There is  mild concentric left ventricular hypertrophy. Left ventricular diastolic parameters were normal. Right Ventricle: The right ventricular size is normal. No increase in right ventricular wall thickness. Right ventricular systolic function is normal. Tricuspid regurgitation signal is inadequate for assessing PA pressure. Left Atrium: Left atrial size was normal in size. Right Atrium: Right atrial size was normal in size. Pericardium: Trivial pericardial effusion is present. Mitral Valve: The mitral valve is grossly normal. Trivial mitral valve regurgitation. No evidence of mitral valve stenosis. Tricuspid Valve: The tricuspid valve is grossly normal. Tricuspid valve regurgitation is trivial. No evidence of tricuspid stenosis. Aortic Valve: The aortic valve is tricuspid. Aortic valve regurgitation is not visualized. No aortic stenosis is present. Pulmonic Valve: The pulmonic valve was grossly normal. Pulmonic valve regurgitation is trivial. No evidence of pulmonic stenosis. Aorta: The aortic root and ascending aorta are structurally normal, with no evidence of dilitation. Venous: The inferior vena cava is normal in size with greater than 50% respiratory variability, suggesting right atrial pressure of 3 mmHg. IAS/Shunts: The atrial septum is grossly normal.  LEFT VENTRICLE PLAX 2D LVIDd:         4.20 cm  Diastology LVIDs:         3.00 cm  LV e' medial:    6.96 cm/s LV PW:         1.26 cm  LV E/e' medial:  10.2 LV IVS:        1.18 cm  LV e' lateral:   8.70 cm/s LVOT diam:     2.10 cm  LV E/e' lateral: 8.2 LVOT Area:     3.46 cm  RIGHT  VENTRICLE RV Basal diam:  3.80 cm RV Mid diam:    3.40 cm RV S prime:     17.50 cm/s TAPSE (M-mode): 2.5 cm LEFT ATRIUM             Index       RIGHT ATRIUM          Index LA diam:        4.00 cm 1.94 cm/m  RA Area:     9.41  cm LA Vol (A2C):   68.4 ml 33.18 ml/m RA Volume:   16.50 ml 8.00 ml/m LA Vol (A4C):   45.0 ml 21.83 ml/m LA Biplane Vol: 56.1 ml 27.21 ml/m   AORTA Ao Root diam: 3.30 cm MITRAL VALVE MV Area (PHT): 2.50 cm    SHUNTS MV Decel Time: 304 msec    Systemic Diam: 2.10 cm MV E velocity: 71.30 cm/s MV A velocity: 85.90 cm/s MV E/A ratio:  0.83 Eleonore Chiquito MD Electronically signed by Eleonore Chiquito MD Signature Date/Time: 07/27/2021/3:03:09 PM    Final

## 2021-07-29 NOTE — Consult Note (Addendum)
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  He has past medical history significant for hypertension, dyslipidemia, obesity, and suspected coronary artery disease.  He has a 12-pack-year smoking history but has not smoked since 1981.  He was apparently having some cardiac symptoms last year and underwent a Cardiolite stress test by [facility] health care.  This demonstrated mild to moderate lateral wall ischemia.  He did not follow through with any further work-up preferring to get a second opinion but has not seen any other healthcare provider for management of his suspected coronary disease in the interim.  He presented to the emergency room at [facility] by way of EMS on 07/26/2021 after having a syncopal episode while playing basketball at the [facility].  This episode apparently lasted less than a minute and was accompanied by shortness of breath.  In the emergency room, he was noted to have a blood pressure of 190/94.  EKG showed normal sinus rhythm with no acute ischemic changes.  Chest x-ray was unremarkable.  Initial troponin was 44 and serial levels rose slightly to 55 in 66.  Given his previous positive Cardiolite stress test, decision was made to admit to the hospital for further evaluation and management.  He was seen by cardiology service.  He was started on aspirin, heparin infusion, and rosuvastatin.  He was also started on Avapro for his hypertension with appropriate response.  He had no further chest pain or shortness of breath after admission to the  hospital. An echocardiogram was obtained that showed an ejection fraction of 60 to 65%.  There is no regional wall abnormalities.  RV function was normal.  There was no significant valvular abnormality. Left heart catheterization was also recommended in carried out earlier today.  This demonstrates severe three-vessel coronary artery disease.  Please see the procedure note below. We have been asked to evaluate Alexander Quinn for consideration of coronary bypass grafting.     Alexander Quinn has a long history of multiple episodes of nephrolithiasis.  He was seen by his urologist this past Friday and had an x-ray that showed multiple small stones in both kidneys.  He has also had elevation in his PSA multiple occasions and has had prostate biopsy x2 both of which were negative for malignancy.   He is right-hand dominant. He denies any symptoms of vascular disease or varicosities.   Current Activity/ Functional Status:   Zubrod Score: At the time of surgery this patient's most appropriate activity status/level should be described as: []     0    Normal activity, no symptoms [x]     1    Restricted in physical strenuous activity but ambulatory, able to do out light work []     2    Ambulatory and capable of self care, unable to do work activities, up and about                 more than 50%  Of the  time                            []     3    Only limited self care, in bed greater than 50% of waking hours []     4    Completely disabled, no self care, confined to bed or chair []     5    Moribund  Past Medical History:  Diagnosis Date   Back pain    Hypertension    Mixed hyperlipidemia    Squamous cell carcinoma of scalp     Past Surgical History:  Procedure Laterality Date   BACK SURGERY     65 years of age   BIOPSY PROSTATE  03/2020   2nd Biopsy - Negative per patient   CATARACT EXTRACTION, BILATERAL Bilateral 2000   COLONOSCOPY  2020   2nd Colonoscopy - Performed in [facility] CATH AND  CORONARY ANGIOGRAPHY N/A 07/28/2021   Procedure: LEFT HEART CATH AND CORONARY ANGIOGRAPHY;  Surgeon: Jettie Booze, MD;  Location: [facility];  Service: Cardiovascular;  Laterality: N/A;   LITHOTRIPSY  12/2019   Covid Screen prior to procedure & negative per patient   MENISCUS REPAIR Left 2010    Social History   Tobacco Use  Smoking Status Former  Smokeless Tobacco Never    Social History   Substance and Sexual Activity  Alcohol Use Never     Allergies  Allergen Reactions   Simvastatin-High Dose Other (See Comments)    Memory problems    Current Facility-Administered Medications  Medication Dose Route Frequency Provider Last Rate Last Admin   0.9 %  sodium chloride infusion  250 mL Intravenous PRN Jettie Booze, MD       acetaminophen (TYLENOL) tablet 650 mg  650 mg Oral Q4H PRN Jettie Booze, MD       amLODipine (NORVASC) tablet 10 mg  10 mg Oral Daily Jettie Booze, MD   10 mg at 07/29/21 0840   aspirin EC tablet 81 mg  81 mg Oral Daily Jettie Booze, MD   81 mg at 07/29/21 0840   cholecalciferol (VITAMIN D3) tablet 1,000 Units  1,000 Units Oral Daily Jettie Booze, MD   1,000 Units at 07/29/21 0840   heparin ADULT infusion 100 units/mL (25000 units/240mL)  1,200 Units/hr Intravenous Continuous Franky Macho, RPH 12 mL/hr at 07/29/21 0512 1,200 Units/hr at 07/29/21 0512   irbesartan (AVAPRO) tablet 150 mg  150 mg Oral Daily Jettie Booze, MD   150 mg at 07/29/21 0840   isosorbide mononitrate (IMDUR) 24 hr tablet 30 mg  30 mg Oral Once [place], [place], PA       [START ON 07/30/2021] isosorbide mononitrate (IMDUR) 24 hr tablet 60 mg  60 mg Oral Daily Bhagat, Bhavinkumar, PA       magnesium oxide (MAG-OX) tablet 200 mg  200 mg Oral Daily Larae Grooms S, MD   200 mg at 07/29/21 0840   nitroGLYCERIN (NITROSTAT) SL tablet 0.4 mg  0.4 mg Sublingual Q5 Min x 3 PRN Larae Grooms S, MD   0.4 mg at 07/29/21 0215    ondansetron (ZOFRAN) injection 4 mg  4 mg Intravenous Q6H PRN Jettie Booze, MD       rosuvastatin (CRESTOR) tablet 40 mg  40 mg Oral q1800 Bhagat, Bhavinkumar, PA       sodium chloride flush (NS) 0.9 % injection  3 mL  3 mL Intravenous Q12H Jettie Booze, MD   3 mL at 07/29/21 0841   sodium chloride flush (NS) 0.9 % injection 3 mL  3 mL Intravenous PRN Jettie Booze, MD       vitamin B-12 (CYANOCOBALAMIN) tablet 1,000 mcg  1,000 mcg Oral Daily Jettie Booze, MD   1,000 mcg at 07/29/21 0840    Medications Prior to Admission  Medication Sig Dispense Refill Last Dose   albuterol (VENTOLIN HFA) 108 (90 Base) MCG/ACT inhaler Inhale 1-2 puffs into the lungs See admin instructions. Inhale 1-2 puffs into the lungs every four to six hours as needed for shortness of breath or wheezing   unk   aspirin 325 MG EC tablet Take 325 mg by mouth daily as needed (for back pain or headaches).   07/25/2021 at 1000   Cholecalciferol (VITAMIN D-3) 25 MCG (1000 UT) CAPS Take 1,000 Units by mouth daily.   07/25/2021 at am   CINNAMON PO Take 1,000 mg by mouth daily.   07/25/2021   Coenzyme Q10 100 MG capsule Take 100 mg by mouth daily.   07/25/2021   cyanocobalamin 1000 MCG tablet Take by mouth.   07/25/2021   magnesium 30 MG tablet Take 30 mg by mouth daily.   07/25/2021   POTASSIUM PO Take 1 tablet by mouth daily.   07/25/2021   pyridoxine (B-6) 100 MG tablet Take 100 mg by mouth daily.   07/25/2021   telmisartan (MICARDIS) 40 MG tablet Take 40 mg by mouth in the morning.   07/25/2021 at am   ZINC SULFATE PO Take 1 tablet by mouth daily.   07/25/2021 at am    Family History  Problem Relation Age of Onset   Cancer Mother    Alcohol abuse Mother    Alcohol abuse Father    Cancer Father    Aneurysm Maternal Grandfather      Review of Systems:   ROS    Cardiac Review of Systems: Y or  [    ]= no  Chest Pain [    ]  Resting SOB [   ] Exertional SOB  [ x ]  Orthopnea [  ]   Pedal Edema [    ]    Palpitations [  ] Syncope  [  ]   Presyncope [   ]  General Review of Systems: [Y] = yes [  ]=no Constitional: recent weight change [  ]; anorexia [  ]; fatigue [  ]; nausea [  ]; night sweats [  ]; fever [  ]; or chills [  ]                                                               Dental: Last Dentist visit: 12 years ago  Eye : blurred vision [  ]; diplopia [   ]; vision changes [  ];  Amaurosis fugax[  ]; Resp: cough [  ];  wheezing[  ];  hemoptysis[  ]; shortness of breath[ x ]; paroxysmal nocturnal dyspnea[  ]; dyspnea on exertion[  ]; or orthopnea[  ];  GI:  gallstones[  ], vomiting[  ];  dysphagia[  ]; melena[  ];  hematochezia [  ]; heartburn[  ];  Hx of  Colonoscopy[  ]; GU: kidney stones [ x ]; hematuria[  ];   dysuria [  ];  nocturia[  ];  history of     obstruction [  ]; urinary frequency [  ]             Skin: rash, swelling[  ];, hair loss[  ];  peripheral edema[  ];  or itching[  ]; Musculosketetal: myalgias[  ];  joint swelling[  ];  joint erythema[  ];  joint pain[  ];  back pain[  ];  Heme/Lymph: bruising[  ];  bleeding[  ];  anemia[  ];  Neuro: TIA[  ];  headaches[  ];  stroke[  ];  vertigo[  ];  seizures[  ];   paresthesias[  ];  difficulty walking[  ];  Psych:depression[  ]; anxiety[  ];  Endocrine: diabetes[  ];  thyroid dysfunction[  ];                     Physical Exam: BP 135/62   Pulse (!) 51   Temp 98 F (36.7 C) (Oral)   Resp 18   Ht 5\' 9"  (1.753 m)   Wt 91.5 kg   SpO2 98%   BMI 29.79 kg/m    General appearance: alert, cooperative, and no distress Head: Normocephalic, without obvious abnormality, atraumatic Neck: no adenopathy, no carotid bruit, no JVD, and supple, symmetrical, trachea midline Lymph nodes: No cervical or clavicular adenopathy Resp: clear to auscultation bilaterally Cardio: regular rate and rhythm, S1, S2 normal, no murmur, click, rub or gallop GI: soft, non-tender; bowel sounds normal; no masses,  no  organomegaly Extremities: extremities normal, atraumatic, no cyanosis or edema and all are well perfused with easily palpable distal pulses.  Allen's test is negative in both upper extremities.  There is no evidence of varicosities in the lower extremities. Neurologic: Grossly normal  Diagnostic Studies & Laboratory data:  LEFT HEART CATH    Conclusion      Mid LM lesion is 30% stenosed.   RPDA lesion is 75% stenosed.   RPAV lesion is 80% stenosed.   Prox Cx to Mid Cx lesion is 90% stenosed.   Mid LAD-2 lesion is 99% stenosed.   Mid LAD-1 lesion is 50% stenosed.   Dist RCA lesion is 50% stenosed.   The left ventricular systolic function is normal.   LV end diastolic pressure is normal.   The left ventricular ejection fraction is 55-65% by visual estimate.   There is no aortic valve stenosis.   Severe three vessel CAD. Mid LAD subtotal occlusion at a large diagonal branch.  Proximal to mid circumflex lesion and distal RCA and branch vessel disease.     Plan for cardiac surgery consult.   Coronary Findings  Diagnostic Dominance: Right Left Main  Mid LM lesion is 30% stenosed.  Left Anterior Descending  There is mild diffuse disease throughout the vessel.  Collaterals  Dist LAD filled by collaterals from 2nd Diag.    Mid LAD-1 lesion is 50% stenosed.  Mid LAD-2 lesion is 99% stenosed.  Left Circumflex  Prox Cx to Mid Cx lesion is 90% stenosed.  Right Coronary Artery  Vessel is large. The vessel exhibits minimal luminal irregularities.  Dist RCA lesion is 50% stenosed.  Right Posterior Descending Artery  RPDA lesion is 75% stenosed.  Right Posterior Atrioventricular Artery  RPAV lesion is 80% stenosed.  Intervention  No interventions have been documented. Wall Motion  Left Heart  Left Ventricle The left ventricular size is normal. The left ventricular systolic function is normal. LV end diastolic pressure is normal. The left ventricular ejection  fraction is 55-65% by visual estimate. No regional wall motion abnormalities.  Aortic Valve There is no aortic valve stenosis.   Coronary Diagrams   Diagnostic Dominance: Right      ECHOCARDIOGRAM REPORT         Patient Name:   Alexander Quinn Date of Exam: 07/27/2021  Medical Rec #:  [id number]        Height:       69.0 in  Accession #:    [id number]       Weight:       199.0 lb  Date of Birth:  03-29-1956         BSA:          2.062 m  Patient Age:    35 years         BP:           159/90 mmHg  Patient Gender: M                HR:           49 bpm.  Exam Location:  Inpatient   Procedure: 2D Echo, Color Doppler and Cardiac Doppler   Indications:    Syncope R55     History:        Patient has no prior history of Echocardiogram  examinations.                  Signs/Symptoms:Syncope; Risk Factors:Hypertension,  Dyslipidemia                  and Former Smoker. Elevated Troponin.     Sonographer:    Leavy Cella RDCS  Referring Phys: [id number] [place]     1. Left ventricular ejection fraction, by estimation, is 60 to 65%. The  left ventricle has normal function. The left ventricle has no regional  wall motion abnormalities. There is mild concentric left ventricular  hypertrophy. Left ventricular diastolic  parameters were normal.   2. Right ventricular systolic function is normal. The right ventricular  size is normal. Tricuspid regurgitation signal is inadequate for assessing  PA pressure.   3. The mitral valve is grossly normal. Trivial mitral valve  regurgitation. No evidence of mitral stenosis.   4. The aortic valve is tricuspid. Aortic valve regurgitation is not  visualized. No aortic stenosis is present.   5. The inferior vena cava is normal in size with greater than 50%  respiratory variability, suggesting right atrial pressure of 3 mmHg.   Conclusion(s)/Recommendation(s): Normal biventricular function without  evidence of  hemodynamically significant valvular heart disease.   FINDINGS   Left Ventricle: Left ventricular ejection fraction, by estimation, is 60  to 65%. The left ventricle has normal function. The left ventricle has no  regional wall motion abnormalities. The left ventricular internal cavity  size was normal in size. There is   mild concentric left ventricular hypertrophy. Left ventricular diastolic  parameters were normal.   Right Ventricle: The right ventricular size is normal. No increase in  right ventricular wall thickness. Right ventricular systolic function is  normal. Tricuspid regurgitation signal is inadequate for assessing PA  pressure.   Left Atrium: Left atrial size was normal in size.   Right Atrium: Right atrial size was normal in size.   Pericardium: Trivial pericardial  effusion is present.   Mitral Valve: The mitral valve is grossly normal. Trivial mitral valve  regurgitation. No evidence of mitral valve stenosis.   Tricuspid Valve: The tricuspid valve is grossly normal. Tricuspid valve  regurgitation is trivial. No evidence of tricuspid stenosis.   Aortic Valve: The aortic valve is tricuspid. Aortic valve regurgitation is  not visualized. No aortic stenosis is present.   Pulmonic Valve: The pulmonic valve was grossly normal. Pulmonic valve  regurgitation is trivial. No evidence of pulmonic stenosis.   Aorta: The aortic root and ascending aorta are structurally normal, with  no evidence of dilitation.   Venous: The inferior vena cava is normal in size with greater than 50%  respiratory variability, suggesting right atrial pressure of 3 mmHg.   IAS/Shunts: The atrial septum is grossly normal.      LEFT VENTRICLE  PLAX 2D  LVIDd:         4.20 cm  Diastology  LVIDs:         3.00 cm  LV e' medial:    6.96 cm/s  LV PW:         1.26 cm  LV E/e' medial:  10.2  LV IVS:        1.18 cm  LV e' lateral:   8.70 cm/s  LVOT diam:     2.10 cm  LV E/e' lateral: 8.2  LVOT  Area:     3.46 cm      RIGHT VENTRICLE  RV Basal diam:  3.80 cm  RV Mid diam:    3.40 cm  RV S prime:     17.50 cm/s  TAPSE (M-mode): 2.5 cm   LEFT ATRIUM             Index       RIGHT ATRIUM          Index  LA diam:        4.00 cm 1.94 cm/m  RA Area:     9.41 cm  LA Vol (A2C):   68.4 ml 33.18 ml/m RA Volume:   16.50 ml 8.00 ml/m  LA Vol (A4C):   45.0 ml 21.83 ml/m  LA Biplane Vol: 56.1 ml 27.21 ml/m      AORTA  Ao Root diam: 3.30 cm   MITRAL VALVE  MV Area (PHT): 2.50 cm    SHUNTS  MV Decel Time: 304 msec    Systemic Diam: 2.10 cm  MV E velocity: 71.30 cm/s  MV A velocity: 85.90 cm/s  MV E/A ratio:  0.83   Eleonore Chiquito MD  Electronically signed by Eleonore Chiquito MD  Signature Date/Time: 07/27/2021/3:03:09 PM            Recent Radiology Findings:    CHEST XRAY - 2 VIEW   COMPARISON:  None.   FINDINGS: Lungs are clear. Heart size and pulmonary vascularity are normal. No adenopathy. No bone lesions.   IMPRESSION: No edema or consolidation.     Electronically Signed   By: Lowella Grip III M.D.   On: 07/29/2018 10:23   I have independently reviewed the above radiologic studies and discussed with the patient   Recent Lab Findings: Lab Results  Component Value Date   WBC 6.1 07/29/2021   HGB 13.7 07/29/2021   HCT 41.8 07/29/2021   PLT 330 07/29/2021   GLUCOSE 96 07/29/2021   CHOL 217 (H) 07/27/2021   TRIG 96 07/27/2021   HDL 40 (L) 07/27/2021   LDLCALC 158 (H) 07/27/2021  ALT 33 03/24/2021   AST 31 03/24/2021   NA 138 07/29/2021   K 3.9 07/29/2021   CL 107 07/29/2021   CREATININE 1.02 07/29/2021   BUN 10 07/29/2021   CO2 22 07/29/2021   TSH 2.410 03/24/2021   HGBA1C 5.5 07/27/2021      Assessment / Plan:      Very pleasant 65 year old male with history of hypertension and dyslipidemia is discovered to have severe three-vessel coronary artery disease with preserved LV function after presenting with a brief syncopal episode while  playing basketball at the [facility] this weekend.  He did not have any chest pain at the time of that episode and denies having any chest pain in the hospital since admission.  He is currently on aspirin, oral nitrates, and heparin infusion.  Blood pressure has been controlled with Avapro. Coronary bypass grafting is his best option for revascularization.  The procedure and expected perioperative course was discussed with Alexander Quinn and his wife as well as his brother who was at the bedside today.  Multiple questions were answered to their satisfaction.  Alexander Quinn would like for [place] to proceed with preoperative work-up in preparation for surgery.  He is agreeable to proceed and is soon as  OR space is available. Dr. Kipp Brood will review Alexander Quinn's medical data and coronary angiography and will determine timing of surgery.   I  spent 30 minutes counseling the patient face to face.   Antony Odea, PA-C  07/29/2021 10:07 AM  Patient seen and examined, agree with above. Alexander Quinn is a 65 yo man with a history of hypertension and hyperlipidemia. Stress test a year ago but no further cardiac history. Presented with shortness of breath and syncope.  R/I for non STEMI. Cath shows severe 3 vessel CAD. CABG indicated for survival benefit and relief of symptoms.  I discussed the general nature of the procedure, including the need for general anesthesia, the incisions to be used, the use of cardiopulmonary bypass, and drainage tubes and temporary pacing wires postoperatively with Alexander Quinn.  We discussed the expected hospital stay, overall recovery and short and long term outcomes. I informed him of the indications, risks, benefits and alternatives.  He understands the risks include but are not limited to death, stroke, MI, DVT/PE, bleeding, possible need for transfusion, infections, cardiac arrhythmias, as well as other organ system dysfunction including respiratory, renal, or GI complications.   He  accepts the risks and agrees to proceed.   For CABG Thursday AM 9/22  Revonda Standard. Roxan Hockey, MD [facility] [phone]

## 2021-07-29 NOTE — Progress Notes (Signed)
PT. C/o CP 4/10. On call for Cardiology paged to make aware.

## 2021-07-29 NOTE — Progress Notes (Signed)
ANTICOAGULATION CONSULT NOTE - Follow Up Consult  Pharmacy Consult for heparin Indication: chest pain/ACS  Labs: Recent Labs    07/26/21 0955 07/26/21 1249 07/26/21 1457 07/26/21 1705 07/27/21 0246 07/27/21 1041 07/28/21 0501 07/28/21 1323 07/29/21 0337  HGB 15.3  --   --   --  15.0  --  14.7  --  13.7  HCT 46.9  --   --   --  44.5  --  45.5  --  41.8  PLT 321  --   --   --  328  --  337  --  330  HEPARINUNFRC  --   --   --   --  0.10*   < > >1.10* 0.47 0.16*  CREATININE 1.16  --   --   --  1.15  --   --   --   --   TROPONINIHS 44* 55* 64* 66*  --   --   --   --   --    < > = values in this interval not displayed.     Assessment: 65yo male s/p cath - heparin restarted for severe 3V CAD. For CABG eval.  Heparin level subtherapeutic (0.16) on gtt at 1000 units/hr. No issues with line or bleeding reported per RN.  Goal of Therapy:  Heparin level 0.3-0.7 units/ml Monitor platelets by anticoagulation protocol: Yes   Plan:  Increase heparin to 1200 units/hr Will f/u 6 hr heparin level  Sherlon Handing, PharmD, BCPS Please see amion for complete clinical pharmacist phone list 07/29/2021 4:53 AM

## 2021-07-29 NOTE — Progress Notes (Signed)
[id number] Education completed with pt and wife. Discussed importance of walking and IS after surgery. Gave IS and pt demonstrated 2500 ml. Discussed sternal precautions and staying in the tube. Demonstrated how to get up and down without use of arms. Gave OHS booklet, care guide, staying in the tube handout and wrote down how to view pre op video. Answered questions by pt and wife. Graylon Good RN BSN 07/29/2021 3:30 PM

## 2021-07-29 NOTE — Progress Notes (Signed)
ANTICOAGULATION CONSULT NOTE - Follow Up Consult  Pharmacy Consult for heparin Indication: chest pain/ACS  Labs: Recent Labs    07/27/21 0246 07/27/21 1041 07/28/21 0501 07/28/21 1323 07/29/21 0337 07/29/21 1147 07/29/21 1949  HGB 15.0  --  14.7  --  13.7  --   --   HCT 44.5  --  45.5  --  41.8  --   --   PLT 328  --  337  --  330  --   --   HEPARINUNFRC 0.10*   < > >1.10*   < > 0.16* 0.45 0.45  CREATININE 1.15  --   --   --  1.02  --   --    < > = values in this interval not displayed.     Assessment: 65yo male s/p cath on 9/19 showing severe 3 vessel CAD. Heparin was reinitiated while awaiting evaluation for CABG.  Confirmatory heparin level of 0.45 is therapeutic on heparin 1200 units/hr.   Goal of Therapy:  Heparin level 0.3-0.7 units/ml Monitor platelets by anticoagulation protocol: Yes   Plan:  Continue heparin at 1200 units/hr Daily heparin level and CBC  Thank you for allowing pharmacy to participate in this patient's care.  Cristela Felt, PharmD, BCPS Clinical Pharmacist  07/29/2021 8:42 PM Check [email or web address] for unit specific pharmacy number

## 2021-07-29 NOTE — Progress Notes (Addendum)
Progress Note  Patient Name: Alexander Quinn Date of Encounter: 07/29/2021  [facility] Cardiologist: Will be followed by [facility]  Subjective   Chest pain overnight which resolved with SL nitro x 1.   Inpatient Medications    Scheduled Meds:  amLODipine  10 mg Oral Daily   aspirin EC  81 mg Oral Daily   cholecalciferol  1,000 Units Oral Daily   irbesartan  150 mg Oral Daily   isosorbide mononitrate  30 mg Oral Daily   magnesium oxide  200 mg Oral Daily   rosuvastatin  40 mg Oral q1800   sodium chloride flush  3 mL Intravenous Q12H   cyanocobalamin  1,000 mcg Oral Daily   Continuous Infusions:  sodium chloride     heparin 1,200 Units/hr (07/29/21 0512)   PRN Meds: sodium chloride, acetaminophen, nitroGLYCERIN, ondansetron (ZOFRAN) IV, sodium chloride flush   Vital Signs    Vitals:   07/29/21 0216 07/29/21 0220 07/29/21 0411 07/29/21 0844  BP: 127/71 (!) 144/78 (!) 110/55 135/62  Pulse: (!) 55 (!) 52 (!) 44 (!) 51  Resp:   20 18  Temp:   98 F (36.7 C)   TempSrc:   Oral   SpO2:   98%   Weight:   91.5 kg   Height:        Intake/Output Summary (Last 24 hours) at 07/29/2021 0954 Last data filed at 07/29/2021 0600 Gross per 24 hour  Intake 539.25 ml  Output 1575 ml  Net -1035.75 ml   Last 3 Weights 07/29/2021 07/28/2021 07/27/2021  Weight (lbs) 201 lb 11.2 oz 201 lb 199 lb  Weight (kg) 91.491 kg 91.173 kg 90.266 kg      Telemetry    Sinus bradycardia at 40-50s - Personally Reviewed  ECG    Sinus bradycardia at 51 bpm - Personally Reviewed  Physical Exam   GEN: No acute distress.   Neck: No JVD Cardiac: RRR, no murmurs, rubs, or gallops. Right radial cath site without hematoma.  Respiratory: Clear to auscultation bilaterally. GI: Soft, nontender, non-distended  MS: No edema; No deformity. Neuro:  Nonfocal  Psych: Normal affect   Labs    High Sensitivity Troponin:   Recent Labs  Lab 07/26/21 0955 07/26/21 1249 07/26/21 1457 07/26/21 1705   TROPONINIHS 44* 55* 64* 66*     Chemistry Recent Labs  Lab 07/26/21 0955 07/27/21 0246 07/29/21 0337  NA 138 140 138  K 4.4 3.8 3.9  CL 107 106 107  CO2 25 24 22   GLUCOSE 107* 100* 96  BUN 11 13 10   CREATININE 1.16 1.15 1.02  CALCIUM 9.4 8.9 9.0  MG  --  2.3  --   GFRNONAA >60 >60 >60  ANIONGAP 6 10 9     Lipids  Recent Labs  Lab 07/27/21 0246  CHOL 217*  TRIG 96  HDL 40*  LDLCALC 158*  CHOLHDL 5.4    Hematology Recent Labs  Lab 07/27/21 0246 07/28/21 0501 07/29/21 0337  WBC 4.9 6.3 6.1  RBC 5.37 5.36 4.95  HGB 15.0 14.7 13.7  HCT 44.5 45.5 41.8  MCV 82.9 84.9 84.4  MCH 27.9 27.4 27.7  MCHC 33.7 32.3 32.8  RDW 13.8 13.8 13.8  PLT 328 337 330    Radiology    CARDIAC CATHETERIZATION  Result Date: 07/28/2021   Mid LM lesion is 30% stenosed.   RPDA lesion is 75% stenosed.   RPAV lesion is 80% stenosed.   Prox Cx to Mid Cx lesion is  90% stenosed.   Mid LAD-2 lesion is 99% stenosed.   Mid LAD-1 lesion is 50% stenosed.   Dist RCA lesion is 50% stenosed.   The left ventricular systolic function is normal.   LV end diastolic pressure is normal.   The left ventricular ejection fraction is 55-65% by visual estimate.   There is no aortic valve stenosis. Severe three vessel CAD. Mid LAD subtotal occlusion at a large diagonal branch.  Proximal to mid circumflex lesion and distal RCA and branch vessel disease.  Plan for cardiac surgery consult.   ECHOCARDIOGRAM COMPLETE  Result Date: 07/27/2021    ECHOCARDIOGRAM REPORT   Patient Name:   Alexander Quinn Date of Exam: 07/27/2021 Medical Rec #:  [id number]        Height:       69.0 in Accession #:    [id number]       Weight:       199.0 lb Date of Birth:  May 13, 1956         BSA:          2.062 m Patient Age:    65 years         BP:           159/90 mmHg Patient Gender: M                HR:           49 bpm. Exam Location:  Inpatient Procedure: 2D Echo, Color Doppler and Cardiac Doppler Indications:    Syncope R55  History:         Patient has no prior history of Echocardiogram examinations.                 Signs/Symptoms:Syncope; Risk Factors:Hypertension, Dyslipidemia                 and Former Smoker. Elevated Troponin.  Sonographer:    Leavy Cella RDCS Referring Phys: [id number] [place]  1. Left ventricular ejection fraction, by estimation, is 60 to 65%. The left ventricle has normal function. The left ventricle has no regional wall motion abnormalities. There is mild concentric left ventricular hypertrophy. Left ventricular diastolic parameters were normal.  2. Right ventricular systolic function is normal. The right ventricular size is normal. Tricuspid regurgitation signal is inadequate for assessing PA pressure.  3. The mitral valve is grossly normal. Trivial mitral valve regurgitation. No evidence of mitral stenosis.  4. The aortic valve is tricuspid. Aortic valve regurgitation is not visualized. No aortic stenosis is present.  5. The inferior vena cava is normal in size with greater than 50% respiratory variability, suggesting right atrial pressure of 3 mmHg. Conclusion(s)/Recommendation(s): Normal biventricular function without evidence of hemodynamically significant valvular heart disease. FINDINGS  Left Ventricle: Left ventricular ejection fraction, by estimation, is 60 to 65%. The left ventricle has normal function. The left ventricle has no regional wall motion abnormalities. The left ventricular internal cavity size was normal in size. There is  mild concentric left ventricular hypertrophy. Left ventricular diastolic parameters were normal. Right Ventricle: The right ventricular size is normal. No increase in right ventricular wall thickness. Right ventricular systolic function is normal. Tricuspid regurgitation signal is inadequate for assessing PA pressure. Left Atrium: Left atrial size was normal in size. Right Atrium: Right atrial size was normal in size. Pericardium: Trivial pericardial effusion is  present. Mitral Valve: The mitral valve is grossly normal. Trivial mitral valve regurgitation. No evidence of mitral valve stenosis. Tricuspid Valve:  The tricuspid valve is grossly normal. Tricuspid valve regurgitation is trivial. No evidence of tricuspid stenosis. Aortic Valve: The aortic valve is tricuspid. Aortic valve regurgitation is not visualized. No aortic stenosis is present. Pulmonic Valve: The pulmonic valve was grossly normal. Pulmonic valve regurgitation is trivial. No evidence of pulmonic stenosis. Aorta: The aortic root and ascending aorta are structurally normal, with no evidence of dilitation. Venous: The inferior vena cava is normal in size with greater than 50% respiratory variability, suggesting right atrial pressure of 3 mmHg. IAS/Shunts: The atrial septum is grossly normal.  LEFT VENTRICLE PLAX 2D LVIDd:         4.20 cm  Diastology LVIDs:         3.00 cm  LV e' medial:    6.96 cm/s LV PW:         1.26 cm  LV E/e' medial:  10.2 LV IVS:        1.18 cm  LV e' lateral:   8.70 cm/s LVOT diam:     2.10 cm  LV E/e' lateral: 8.2 LVOT Area:     3.46 cm  RIGHT VENTRICLE RV Basal diam:  3.80 cm RV Mid diam:    3.40 cm RV S prime:     17.50 cm/s TAPSE (M-mode): 2.5 cm LEFT ATRIUM             Index       RIGHT ATRIUM          Index LA diam:        4.00 cm 1.94 cm/m  RA Area:     9.41 cm LA Vol (A2C):   68.4 ml 33.18 ml/m RA Volume:   16.50 ml 8.00 ml/m LA Vol (A4C):   45.0 ml 21.83 ml/m LA Biplane Vol: 56.1 ml 27.21 ml/m   AORTA Ao Root diam: 3.30 cm MITRAL VALVE MV Area (PHT): 2.50 cm    SHUNTS MV Decel Time: 304 msec    Systemic Diam: 2.10 cm MV E velocity: 71.30 cm/s MV A velocity: 85.90 cm/s MV E/A ratio:  0.83 Eleonore Chiquito MD Electronically signed by Eleonore Chiquito MD Signature Date/Time: 07/27/2021/3:03:09 PM    Final     Cardiac Studies  Echo 07/27/21 1. Left ventricular ejection fraction, by estimation, is 60 to 65%. The  left ventricle has normal function. The left ventricle has no  regional  wall motion abnormalities. There is mild concentric left ventricular  hypertrophy. Left ventricular diastolic  parameters were normal.   2. Right ventricular systolic function is normal. The right ventricular  size is normal. Tricuspid regurgitation signal is inadequate for assessing  PA pressure.   3. The mitral valve is grossly normal. Trivial mitral valve  regurgitation. No evidence of mitral stenosis.   4. The aortic valve is tricuspid. Aortic valve regurgitation is not  visualized. No aortic stenosis is present.   5. The inferior vena cava is normal in size with greater than 50%  respiratory variability, suggesting right atrial pressure of 3 mmHg.   Conclusion(s)/Recommendation(s): Normal biventricular function without  evidence of hemodynamically significant valvular heart disease LEFT HEART CATH AND CORONARY ANGIOGRAPHY   Conclusion      Mid LM lesion is 30% stenosed.   RPDA lesion is 75% stenosed.   RPAV lesion is 80% stenosed.   Prox Cx to Mid Cx lesion is 90% stenosed.   Mid LAD-2 lesion is 99% stenosed.   Mid LAD-1 lesion is 50% stenosed.   Dist RCA lesion is 50% stenosed.  The left ventricular systolic function is normal.   LV end diastolic pressure is normal.   The left ventricular ejection fraction is 55-65% by visual estimate.   There is no aortic valve stenosis.   Severe three vessel CAD. Mid LAD subtotal occlusion at a large diagonal branch.  Proximal to mid circumflex lesion and distal RCA and branch vessel disease.     Plan for cardiac surgery consult.    Diagnostic Dominance: Right  Patient Profile     65 y.o. male  with a past medical history of HTN, HLD and obesity who is being seen for the evaluation of syncope and elevated troponin values at the request of Dr. Tomi Bamberger.     He reports being physically active at baseline as he has a garden and also enjoys playing basketball at the [employer]. he initially felt in his normal state of health while  playing basketball but started to develop worsening dyspnea and associated dizziness. He walked over to a chair and says he "slumped over". Someone who works at [employer] came to him and he quickly came back to consciousness.  Assessment & Plan    Exertional dyspnea and syncope CAD - Troponin 44>>55>>64>>66. Symptoms is anginal equivalent. Treated with heparin. Echo with normal LVEF of 60-65%, no regional WM abnormality. No valvular abnormality. Cath showed multivessel CAD>> pending surgical evaluation.  - HR in 40-50s, not on AV blocking agent prior to admit (avoid AV blocking agent) - Chest pain episode overnight >> increase Imdur to 60mg  qd - Continue IV heparin - Continue ASA and statin   3. HLD -07/27/2021: Cholesterol 217; HDL 40; LDL Cholesterol 158; Triglycerides 96; VLDL 19  - LDL goal less than 70 - Continue Crestor 40mg  qd -Lipid panel and LFTS in 8 weeks   4. HTN - BP improving on current meds - Continue Avapro 150mg  qd - Added Amlodipine 10mg  qd this amdission    For questions or updates, please contact [facility] Please consult [email or web address] for contact info under        Signed, Leanor Kail, PA  07/29/2021, 9:54 AM     I have examined the patient and reviewed assessment and plan and discussed with patient.  Agree with above as stated.    Await surgical opinion.  If the distal LAD is not felt to be an adequate target for bypass, could consider multivessel PCI.  Could certainly treat the circumflex and the posterolateral artery.  The LAD may be more difficult to treat percutaneously and treatment will depend on how the distal vessel responded to angioplasty.  Chest pain last night was quite atypical.  He has not had any chest pain with walking.  It was only when he drinks some coffee.  Troponins are very low level and flat, not in the typical ACS pattern.  I think it may be reasonable to consider letting him go home prior surgery, if surgery is thought to be  a possible treatment.  Larae Grooms

## 2021-07-29 NOTE — Progress Notes (Signed)
ANTICOAGULATION CONSULT NOTE - Follow Up Consult  Pharmacy Consult for heparin Indication: chest pain/ACS  Labs: Recent Labs     0000 07/26/21 1457 07/26/21 1705 07/27/21 0246 07/27/21 1041 07/28/21 0501 07/28/21 1323 07/29/21 0337 07/29/21 1147  HGB   < >  --   --  15.0  --  14.7  --  13.7  --   HCT  --   --   --  44.5  --  45.5  --  41.8  --   PLT  --   --   --  328  --  337  --  330  --   HEPARINUNFRC  --   --   --  0.10*   < > >1.10* 0.47 0.16* 0.45  CREATININE  --   --   --  1.15  --   --   --  1.02  --   TROPONINIHS  --  64* 66*  --   --   --   --   --   --    < > = values in this interval not displayed.     Assessment: 65yo male s/p cath on 9/19 showing severe 3 vessel CAD. Heparin was reinitiated while awaiting evaluation for CABG.  Heparin level is therapeutic at 0.45 on drip rate of 1200 units/hr. No issues with line or bleeding reported per RN. CBC is stable. Will continue to follow anticoagulation plans around procedures.  Goal of Therapy:  Heparin level 0.3-0.7 units/ml Monitor platelets by anticoagulation protocol: Yes   Plan:  Continue heparin at 1200 units/hr Check 6 hr heparin level Daily heparin level and CBC  Thank you for allowing pharmacy to participate in this patient's care.  Reatha Harps, PharmD PGY1 Pharmacy Resident 07/29/2021 12:52 PM Check [email or web address] for unit specific pharmacy number

## 2021-07-30 ENCOUNTER — Inpatient Hospital Stay (HOSPITAL_COMMUNITY): Payer: 59

## 2021-07-30 DIAGNOSIS — E782 Mixed hyperlipidemia: Secondary | ICD-10-CM | POA: Diagnosis not present

## 2021-07-30 DIAGNOSIS — R778 Other specified abnormalities of plasma proteins: Secondary | ICD-10-CM | POA: Diagnosis not present

## 2021-07-30 DIAGNOSIS — R55 Syncope and collapse: Secondary | ICD-10-CM

## 2021-07-30 DIAGNOSIS — I10 Essential (primary) hypertension: Secondary | ICD-10-CM | POA: Diagnosis not present

## 2021-07-30 DIAGNOSIS — I214 Non-ST elevation (NSTEMI) myocardial infarction: Secondary | ICD-10-CM | POA: Diagnosis not present

## 2021-07-30 DIAGNOSIS — I2510 Atherosclerotic heart disease of native coronary artery without angina pectoris: Secondary | ICD-10-CM

## 2021-07-30 LAB — PROTIME-INR
INR: 1 (ref 0.8–1.2)
Prothrombin Time: 13.3 seconds (ref 11.4–15.2)

## 2021-07-30 LAB — BLOOD GAS, ARTERIAL
Acid-base deficit: 2.5 mmol/L — ABNORMAL HIGH (ref 0.0–2.0)
Bicarbonate: 21.5 mmol/L (ref 20.0–28.0)
Drawn by: 51185
FIO2: 21
O2 Saturation: 97.1 %
Patient temperature: 36.6
pCO2 arterial: 34.4 mmHg (ref 32.0–48.0)
pH, Arterial: 7.409 (ref 7.350–7.450)
pO2, Arterial: 83.8 mmHg (ref 83.0–108.0)

## 2021-07-30 LAB — CBC
HCT: 41.4 % (ref 39.0–52.0)
Hemoglobin: 13.8 g/dL (ref 13.0–17.0)
MCH: 27.9 pg (ref 26.0–34.0)
MCHC: 33.3 g/dL (ref 30.0–36.0)
MCV: 83.8 fL (ref 80.0–100.0)
Platelets: 316 10*3/uL (ref 150–400)
RBC: 4.94 MIL/uL (ref 4.22–5.81)
RDW: 13.5 % (ref 11.5–15.5)
WBC: 5.6 10*3/uL (ref 4.0–10.5)
nRBC: 0 % (ref 0.0–0.2)

## 2021-07-30 LAB — URINALYSIS, ROUTINE W REFLEX MICROSCOPIC
Bilirubin Urine: NEGATIVE
Glucose, UA: NEGATIVE mg/dL
Hgb urine dipstick: NEGATIVE
Ketones, ur: NEGATIVE mg/dL
Leukocytes,Ua: NEGATIVE
Nitrite: NEGATIVE
Protein, ur: NEGATIVE mg/dL
Specific Gravity, Urine: 1.02 (ref 1.005–1.030)
pH: 6 (ref 5.0–8.0)

## 2021-07-30 LAB — ABO/RH: ABO/RH(D): O POS

## 2021-07-30 LAB — SURGICAL PCR SCREEN
MRSA, PCR: NEGATIVE
Staphylococcus aureus: NEGATIVE

## 2021-07-30 LAB — TYPE AND SCREEN
ABO/RH(D): O POS
Antibody Screen: NEGATIVE

## 2021-07-30 LAB — HEPARIN LEVEL (UNFRACTIONATED): Heparin Unfractionated: 0.42 IU/mL (ref 0.30–0.70)

## 2021-07-30 LAB — APTT: aPTT: 71 seconds — ABNORMAL HIGH (ref 24–36)

## 2021-07-30 MED ORDER — NITROGLYCERIN IN D5W 200-5 MCG/ML-% IV SOLN
2.0000 ug/min | INTRAVENOUS | Status: DC
  Filled 2021-07-30: qty 250

## 2021-07-30 MED ORDER — DIAZEPAM 5 MG PO TABS
5.0000 mg | ORAL_TABLET | Freq: Once | ORAL | Status: AC
  Administered 2021-07-31: 5 mg via ORAL
  Filled 2021-07-30: qty 1

## 2021-07-30 MED ORDER — MILRINONE LACTATE IN DEXTROSE 20-5 MG/100ML-% IV SOLN
0.3000 ug/kg/min | INTRAVENOUS | Status: DC
  Filled 2021-07-30: qty 100

## 2021-07-30 MED ORDER — DEXMEDETOMIDINE HCL IN NACL 400 MCG/100ML IV SOLN
0.1000 ug/kg/h | INTRAVENOUS | Status: AC
  Administered 2021-07-31: .2 ug/kg/h via INTRAVENOUS
  Filled 2021-07-30: qty 100

## 2021-07-30 MED ORDER — EPINEPHRINE HCL 5 MG/250ML IV SOLN IN NS
0.0000 ug/min | INTRAVENOUS | Status: DC
  Filled 2021-07-30: qty 250

## 2021-07-30 MED ORDER — CEFAZOLIN SODIUM-DEXTROSE 2-4 GM/100ML-% IV SOLN
2.0000 g | INTRAVENOUS | Status: AC
  Administered 2021-07-31 (×2): 2 g via INTRAVENOUS
  Filled 2021-07-30: qty 100

## 2021-07-30 MED ORDER — VANCOMYCIN HCL 1500 MG/300ML IV SOLN
1500.0000 mg | INTRAVENOUS | Status: AC
  Administered 2021-07-31: 1500 mg via INTRAVENOUS
  Filled 2021-07-30: qty 300

## 2021-07-30 MED ORDER — TRANEXAMIC ACID (OHS) BOLUS VIA INFUSION
15.0000 mg/kg | INTRAVENOUS | Status: AC
  Administered 2021-07-31: 1360.5 mg via INTRAVENOUS
  Filled 2021-07-30: qty 1361

## 2021-07-30 MED ORDER — CEFAZOLIN SODIUM-DEXTROSE 2-4 GM/100ML-% IV SOLN
2.0000 g | INTRAVENOUS | Status: DC
  Filled 2021-07-30: qty 100

## 2021-07-30 MED ORDER — METOPROLOL TARTRATE 12.5 MG HALF TABLET: 12.5000 mg | ORAL_TABLET | Freq: Once | ORAL | Status: DC

## 2021-07-30 MED ORDER — PHENYLEPHRINE HCL-NACL 20-0.9 MG/250ML-% IV SOLN
30.0000 ug/min | INTRAVENOUS | Status: AC
  Administered 2021-07-31: 20 ug/min via INTRAVENOUS
  Filled 2021-07-30: qty 250

## 2021-07-30 MED ORDER — HEPARIN 30,000 UNITS/1000 ML (OHS) CELLSAVER SOLUTION
Status: DC
  Filled 2021-07-30: qty 1000

## 2021-07-30 MED ORDER — TRANEXAMIC ACID 1000 MG/10ML IV SOLN
1.5000 mg/kg/h | INTRAVENOUS | Status: AC
  Administered 2021-07-31: 1.5 mg/kg/h via INTRAVENOUS
  Filled 2021-07-30: qty 25

## 2021-07-30 MED ORDER — CHLORHEXIDINE GLUCONATE 0.12 % MT SOLN
15.0000 mL | Freq: Once | OROMUCOSAL | Status: AC
  Administered 2021-07-31: 15 mL via OROMUCOSAL
  Filled 2021-07-30: qty 15

## 2021-07-30 MED ORDER — CHLORHEXIDINE GLUCONATE CLOTH 2 % EX PADS
6.0000 | MEDICATED_PAD | Freq: Once | CUTANEOUS | Status: AC
  Administered 2021-07-30: 6 via TOPICAL

## 2021-07-30 MED ORDER — PLASMA-LYTE A IV SOLN
INTRAVENOUS | Status: DC
  Filled 2021-07-30: qty 5

## 2021-07-30 MED ORDER — TRANEXAMIC ACID (OHS) PUMP PRIME SOLUTION
2.0000 mg/kg | INTRAVENOUS | Status: DC
  Filled 2021-07-30: qty 1.81

## 2021-07-30 MED ORDER — MAGNESIUM SULFATE 50 % IJ SOLN
40.0000 meq | INTRAMUSCULAR | Status: DC
  Filled 2021-07-30: qty 9.85

## 2021-07-30 MED ORDER — CHLORHEXIDINE GLUCONATE CLOTH 2 % EX PADS
6.0000 | MEDICATED_PAD | Freq: Once | CUTANEOUS | Status: AC
  Administered 2021-07-31: 6 via TOPICAL

## 2021-07-30 MED ORDER — BISACODYL 5 MG PO TBEC: 5.0000 mg | DELAYED_RELEASE_TABLET | Freq: Once | ORAL | Status: DC

## 2021-07-30 MED ORDER — INSULIN REGULAR(HUMAN) IN NACL 100-0.9 UT/100ML-% IV SOLN
INTRAVENOUS | Status: AC
  Administered 2021-07-31: .9 [IU]/h via INTRAVENOUS
  Filled 2021-07-30: qty 100

## 2021-07-30 MED ORDER — NOREPINEPHRINE 4 MG/250ML-% IV SOLN
0.0000 ug/min | INTRAVENOUS | Status: DC
  Filled 2021-07-30: qty 250

## 2021-07-30 MED ORDER — POTASSIUM CHLORIDE 2 MEQ/ML IV SOLN
80.0000 meq | INTRAVENOUS | Status: DC
  Filled 2021-07-30: qty 40

## 2021-07-30 NOTE — Progress Notes (Signed)
2 Days Post-Op Procedure(s) (LRB): LEFT HEART CATH AND CORONARY ANGIOGRAPHY (N/A) Subjective: No c/o. Denies CP and SOB  Objective: Vital signs in last 24 hours: Temp:  [97.6 F (36.4 C)-98.4 F (36.9 C)] 97.8 F (36.6 C) (09/21 1127) Pulse Rate:  [46-55] 53 (09/21 1127) Cardiac Rhythm: Normal sinus rhythm (09/21 0740) Resp:  [16-20] 16 (09/21 1127) BP: (133-157)/(62-81) 141/80 (09/21 1127) SpO2:  [95 %-98 %] 97 % (09/21 1127) Weight:  [90.7 kg] 90.7 kg (09/21 0403)  Hemodynamic parameters for last 24 hours:    Intake/Output from previous day: 09/20 0701 - 09/21 0700 In: 1008 [P.O.:720; I.V.:288] Out: 2250 [Urine:2250] Intake/Output this shift: No intake/output data recorded.  General appearance: alert, cooperative, and no distress  Lab Results: Recent Labs    07/29/21 0337 07/30/21 0350  WBC 6.1 5.6  HGB 13.7 13.8  HCT 41.8 41.4  PLT 330 316   BMET:  Recent Labs    07/29/21 0337  NA 138  K 3.9  CL 107  CO2 22  GLUCOSE 96  BUN 10  CREATININE 1.02  CALCIUM 9.0    PT/INR:  Recent Labs    07/30/21 1217  LABPROT 13.3  INR 1.0   ABG    Component Value Date/Time   PHART 7.409 07/30/2021 1231   HCO3 21.5 07/30/2021 1231   ACIDBASEDEF 2.5 (H) 07/30/2021 1231   O2SAT 97.1 07/30/2021 1231   CBG (last 3)  No results for input(s): GLUCAP in the last 72 hours.  Assessment/Plan: S/P Procedure(s) (LRB): LEFT HEART CATH AND CORONARY ANGIOGRAPHY (N/A) 3 vessel CAD,  For CABG in AM All questions answered   LOS: 3 days    Melrose Nakayama 07/30/2021

## 2021-07-30 NOTE — Anesthesia Preprocedure Evaluation (Addendum)
Anesthesia Evaluation  Patient identified by MRN, date of birth, ID band Patient awake    Reviewed: Allergy & Precautions, NPO status , Patient's Chart, lab work & pertinent test results  History of Anesthesia Complications Negative for: history of anesthetic complications  Airway Mallampati: II  TM Distance: >3 FB Neck ROM: Full    Dental  (+) Dental Advisory Given   Pulmonary neg pulmonary ROS, former smoker,    Pulmonary exam normal        Cardiovascular hypertension, Pt. on medications + CAD  Normal cardiovascular exam   '22 Carotid US - 1-39% b/l ICAS  '22 Cath - .  Mid LM lesion is 30% stenosed. Marland Kitchen  RPDA lesion is 75% stenosed. Marland Kitchen  RPAV lesion is 80% stenosed. .  Prox Cx to Mid Cx lesion is 90% stenosed. .  Mid LAD-2 lesion is 99% stenosed. .  Mid LAD-1 lesion is 50% stenosed. .  Dist RCA lesion is 50% stenosed.  '22 TTE - EF 60 to 65%. Mild concentric left ventricular hypertrophy. Trivial MR, TR, and PR     Neuro/Psych negative neurological ROS  negative psych ROS   GI/Hepatic negative GI ROS, Neg liver ROS,   Endo/Other  negative endocrine ROS  Renal/GU negative Renal ROS     Musculoskeletal negative musculoskeletal ROS (+)   Abdominal   Peds  Hematology negative hematology ROS (+)   Anesthesia Other Findings   Reproductive/Obstetrics                            Anesthesia Physical Anesthesia Plan  ASA: 4  Anesthesia Plan: General   Post-op Pain Management:    Induction: Intravenous  PONV Risk Score and Plan: 2 and Treatment may vary due to age or medical condition  Airway Management Planned: Oral ETT  Additional Equipment: Arterial line, CVP, PA Cath, TEE and Ultrasound Guidance Line Placement  Intra-op Plan:   Post-operative Plan: Post-operative intubation/ventilation  Informed Consent: I have reviewed the patients History and Physical, chart, labs and  discussed the procedure including the risks, benefits and alternatives for the proposed anesthesia with the patient or authorized representative who has indicated his/her understanding and acceptance.     Dental advisory given  Plan Discussed with: CRNA and Anesthesiologist  Anesthesia Plan Comments:        Anesthesia Quick Evaluation

## 2021-07-30 NOTE — Plan of Care (Signed)

## 2021-07-30 NOTE — Progress Notes (Addendum)
Progress Note  Patient Name: Alexander Quinn Date of Encounter: 07/30/2021  [facility] Cardiologist: None   Subjective   No complaints.  Reports he has been tried on amlodipine in the past and not tolerated.   Inpatient Medications    Scheduled Meds:  amLODipine  10 mg Oral Daily   aspirin EC  81 mg Oral Daily   cholecalciferol  1,000 Units Oral Daily   [START ON 07/31/2021] epinephrine  0-10 mcg/min Intravenous To OR   [START ON 07/31/2021] heparin-papaverine-plasmalyte irrigation   Irrigation To OR   [START ON 07/31/2021] insulin   Intravenous To OR   irbesartan  150 mg Oral Daily   isosorbide mononitrate  60 mg Oral Daily   magnesium oxide  200 mg Oral Daily   [START ON 07/31/2021] magnesium sulfate  40 mEq Other To OR   [START ON 07/31/2021] phenylephrine  30-200 mcg/min Intravenous To OR   [START ON 07/31/2021] potassium chloride  80 mEq Other To OR   rosuvastatin  40 mg Oral q1800   sodium chloride flush  3 mL Intravenous Q12H   [START ON 07/31/2021] tranexamic acid  15 mg/kg Intravenous To OR   [START ON 07/31/2021] tranexamic acid  2 mg/kg Intracatheter To OR   cyanocobalamin  1,000 mcg Oral Daily   Continuous Infusions:  sodium chloride     [START ON 07/31/2021]  ceFAZolin (ANCEF) IV     [START ON 07/31/2021]  ceFAZolin (ANCEF) IV     [START ON 07/31/2021] dexmedetomidine     [START ON 07/31/2021] heparin 30,000 units/NS 1000 mL solution for CELLSAVER     heparin 1,200 Units/hr (07/29/21 1551)   [START ON 07/31/2021] milrinone     [START ON 07/31/2021] nitroGLYCERIN     [START ON 07/31/2021] norepinephrine     [START ON 07/31/2021] tranexamic acid (CYKLOKAPRON) infusion ([facility])     [START ON 07/31/2021] vancomycin     PRN Meds: sodium chloride, acetaminophen, nitroGLYCERIN, ondansetron (ZOFRAN) IV, sodium chloride flush   Vital Signs    Vitals:   07/29/21 1157 07/29/21 2024 07/29/21 2347 07/30/21 0403  BP: 111/68 (!) 157/75 133/73 (!) 153/81  Pulse: (!) 51 (!) 48  (!) 46 (!) 51  Resp: 18 18 20 20   Temp: 98.2 F (36.8 C) 98.1 F (36.7 C) 97.6 F (36.4 C) 98.1 F (36.7 C)  TempSrc: Oral Oral Oral Oral  SpO2: 97% 95% 96% 98%  Weight:    90.7 kg  Height:        Intake/Output Summary (Last 24 hours) at 07/30/2021 1033 Last data filed at 07/30/2021 0900 Gross per 24 hour  Intake 1008 ml  Output 2250 ml  Net -1242 ml   Last 3 Weights 07/30/2021 07/29/2021 07/28/2021  Weight (lbs) 200 lb 201 lb 11.2 oz 201 lb  Weight (kg) 90.719 kg 91.491 kg 91.173 kg      Telemetry    SB - Personally Reviewed  ECG    SB 51 bpm - Personally Reviewed  Physical Exam   GEN: No acute distress.   Neck: No JVD Cardiac: RRR, no murmurs, rubs, or gallops.  Respiratory: Clear to auscultation bilaterally. GI: Soft, nontender, non-distended  MS: No edema; No deformity. Neuro:  Nonfocal  Psych: Normal affect   Labs    High Sensitivity Troponin:   Recent Labs  Lab 07/26/21 0955 07/26/21 1249 07/26/21 1457 07/26/21 1705  TROPONINIHS 44* 55* 64* 66*     Chemistry Recent Labs  Lab 07/26/21 0955 07/27/21 0246 07/29/21  0337  NA 138 140 138  K 4.4 3.8 3.9  CL 107 106 107  CO2 25 24 22   GLUCOSE 107* 100* 96  BUN 11 13 10   CREATININE 1.16 1.15 1.02  CALCIUM 9.4 8.9 9.0  MG  --  2.3  --   GFRNONAA >60 >60 >60  ANIONGAP 6 10 9     Lipids  Recent Labs  Lab 07/27/21 0246  CHOL 217*  TRIG 96  HDL 40*  LDLCALC 158*  CHOLHDL 5.4    Hematology Recent Labs  Lab 07/28/21 0501 07/29/21 0337 07/30/21 0350  WBC 6.3 6.1 5.6  RBC 5.36 4.95 4.94  HGB 14.7 13.7 13.8  HCT 45.5 41.8 41.4  MCV 84.9 84.4 83.8  MCH 27.4 27.7 27.9  MCHC 32.3 32.8 33.3  RDW 13.8 13.8 13.5  PLT 337 330 316   Thyroid No results for input(s): TSH, FREET4 in the last 168 hours.  BNPNo results for input(s): BNP, PROBNP in the last 168 hours.  DDimer No results for input(s): DDIMER in the last 168 hours.   Radiology    CARDIAC CATHETERIZATION  Result Date: 07/28/2021    Mid LM lesion is 30% stenosed.   RPDA lesion is 75% stenosed.   RPAV lesion is 80% stenosed.   Prox Cx to Mid Cx lesion is 90% stenosed.   Mid LAD-2 lesion is 99% stenosed.   Mid LAD-1 lesion is 50% stenosed.   Dist RCA lesion is 50% stenosed.   The left ventricular systolic function is normal.   LV end diastolic pressure is normal.   The left ventricular ejection fraction is 55-65% by visual estimate.   There is no aortic valve stenosis. Severe three vessel CAD. Mid LAD subtotal occlusion at a large diagonal branch.  Proximal to mid circumflex lesion and distal RCA and branch vessel disease.  Plan for cardiac surgery consult.    Cardiac Studies   Echo 07/27/21 1. Left ventricular ejection fraction, by estimation, is 60 to 65%. The  left ventricle has normal function. The left ventricle has no regional  wall motion abnormalities. There is mild concentric left ventricular  hypertrophy. Left ventricular diastolic  parameters were normal.   2. Right ventricular systolic function is normal. The right ventricular  size is normal. Tricuspid regurgitation signal is inadequate for assessing  PA pressure.   3. The mitral valve is grossly normal. Trivial mitral valve  regurgitation. No evidence of mitral stenosis.   4. The aortic valve is tricuspid. Aortic valve regurgitation is not  visualized. No aortic stenosis is present.   5. The inferior vena cava is normal in size with greater than 50%  respiratory variability, suggesting right atrial pressure of 3 mmHg.   Conclusion(s)/Recommendation(s): Normal biventricular function without  evidence of hemodynamically significant valvular heart disease LEFT HEART CATH AND CORONARY ANGIOGRAPHY    Conclusion       Mid LM lesion is 30% stenosed.   RPDA lesion is 75% stenosed.   RPAV lesion is 80% stenosed.   Prox Cx to Mid Cx lesion is 90% stenosed.   Mid LAD-2 lesion is 99% stenosed.   Mid LAD-1 lesion is 50% stenosed.   Dist RCA lesion is 50%  stenosed.   The left ventricular systolic function is normal.   LV end diastolic pressure is normal.   The left ventricular ejection fraction is 55-65% by visual estimate.   There is no aortic valve stenosis.   Severe three vessel CAD. Mid LAD subtotal occlusion at a  large diagonal branch.  Proximal to mid circumflex lesion and distal RCA and branch vessel disease.     Plan for cardiac surgery consult.    Diagnostic Dominance: Right    Patient Profile     65 y.o. male with a past medical history of HTN, HLD and obesity who is being seen for the evaluation of syncope and elevated troponin values at the request of Dr. Tomi Bamberger.   He reports being physically active at baseline as he has a garden and also enjoys playing basketball at the [facility]. he initially felt in his normal state of health while playing basketball but started to develop worsening dyspnea and associated dizziness. He walked over to a chair and says he "slumped over". Someone who works at [employer] came to him and he quickly came back to consciousness.  Assessment & Plan    Exertional dyspnea and syncope/CAD: Troponin 44>>55>>64>>66. Echo with normal LVEF of 60-65%, no regional WM abnormality. No valvular abnormality. Cath showed multivessel CAD, seen by TCTS with plans for CABG tomorrow.   -- HR in 40-50s, not on AV blocking agent prior to admit (avoid AV blocking agent) -- Continue IV heparin, ASA, Imdur and statin    HLD: 07/27/2021: Cholesterol 217; HDL 40; LDL Cholesterol 158; Triglycerides 96; VLDL 19  -- LDL goal less than 70 -- Continue Crestor 40mg  daily    HTN: Bp borderline controlled. Will continue on Avapro 150mg  daily. Says he has been on amlodipine in the past and did not tolerate well. Made him dizzy, sleepy. Will DC for now.   For questions or updates, please contact [place] Please consult [email or web address] for contact info under        Signed, Reino Bellis, NP  07/30/2021, 10:33 AM    I have  examined the patient and reviewed assessment and plan and discussed with patient.  Agree with above as stated.    Plan for CABG in AM. Discussed calcium score screening for other family members if they are interested, per the wife question.   Will need RF modification post op including lipid lowering therapy.   Larae Grooms

## 2021-07-30 NOTE — Progress Notes (Signed)
ANTICOAGULATION CONSULT NOTE - Follow Up Consult  Pharmacy Consult for heparin Indication: chest pain/ACS  Labs: Recent Labs    07/28/21 0501 07/28/21 1323 07/29/21 0337 07/29/21 1147 07/29/21 1949 07/30/21 0350  HGB 14.7  --  13.7  --   --  13.8  HCT 45.5  --  41.8  --   --  41.4  PLT 337  --  330  --   --  316  HEPARINUNFRC >1.10*   < > 0.16* 0.45 0.45 0.42  CREATININE  --   --  1.02  --   --   --    < > = values in this interval not displayed.     Assessment: 65yo male s/p cath on 9/19 showing severe 3 vessel CAD. Heparin was reinitiated while awaiting CABG.  Heparin level is therapeutic at 0.42 on drip rate of 1200 units/hr. No issues with line or bleeding reported per RN. CBC is stable. Will continue to follow anticoagulation plans around procedures.  Goal of Therapy:  Heparin level 0.3-0.7 units/ml Monitor platelets by anticoagulation protocol: Yes   Plan:  Continue heparin at 1200 units/hr Daily heparin level and CBC  Thank you for allowing pharmacy to participate in this patient's care.  Reatha Harps, PharmD PGY1 Pharmacy Resident 07/30/2021 6:47 AM Check [email or web address] for unit specific pharmacy number

## 2021-07-30 NOTE — Progress Notes (Signed)
[phone] Answered questions and reinforced ed. Will follow up after surgery. Pt very motivated to walk and use IS after surgery. Graylon Good RN BSN 07/30/2021 11:04 AM

## 2021-07-30 NOTE — Progress Notes (Signed)
Pre-CABG Dopplers completed. Refer to "CV Proc" under chart review to view preliminary results.  07/30/2021 12:21 PM Kelby Aline., MHA, RVT, RDCS, RDMS  p

## 2021-07-30 NOTE — H&P (View-Only) (Signed)
2 Days Post-Op Procedure(s) (LRB): LEFT HEART CATH AND CORONARY ANGIOGRAPHY (N/A) Subjective: No c/o. Denies CP and SOB  Objective: Vital signs in last 24 hours: Temp:  [97.6 F (36.4 C)-98.4 F (36.9 C)] 97.8 F (36.6 C) (09/21 1127) Pulse Rate:  [46-55] 53 (09/21 1127) Cardiac Rhythm: Normal sinus rhythm (09/21 0740) Resp:  [16-20] 16 (09/21 1127) BP: (133-157)/(62-81) 141/80 (09/21 1127) SpO2:  [95 %-98 %] 97 % (09/21 1127) Weight:  [90.7 kg] 90.7 kg (09/21 0403)  Hemodynamic parameters for last 24 hours:    Intake/Output from previous day: 09/20 0701 - 09/21 0700 In: 1008 [P.O.:720; I.V.:288] Out: 2250 [Urine:2250] Intake/Output this shift: No intake/output data recorded.  General appearance: alert, cooperative, and no distress  Lab Results: Recent Labs    07/29/21 0337 07/30/21 0350  WBC 6.1 5.6  HGB 13.7 13.8  HCT 41.8 41.4  PLT 330 316   BMET:  Recent Labs    07/29/21 0337  NA 138  K 3.9  CL 107  CO2 22  GLUCOSE 96  BUN 10  CREATININE 1.02  CALCIUM 9.0    PT/INR:  Recent Labs    07/30/21 1217  LABPROT 13.3  INR 1.0   ABG    Component Value Date/Time   PHART 7.409 07/30/2021 1231   HCO3 21.5 07/30/2021 1231   ACIDBASEDEF 2.5 (H) 07/30/2021 1231   O2SAT 97.1 07/30/2021 1231   CBG (last 3)  No results for input(s): GLUCAP in the last 72 hours.  Assessment/Plan: S/P Procedure(s) (LRB): LEFT HEART CATH AND CORONARY ANGIOGRAPHY (N/A) 3 vessel CAD,  For CABG in AM All questions answered   LOS: 3 days    Melrose Nakayama 07/30/2021

## 2021-07-30 NOTE — Progress Notes (Signed)
PROGRESS NOTE  Alexander Quinn VZD:[id number] DOB: 05-Aug-1956   PCP: Eden Lathe, MD  Patient is from: Home.  Very active and independent at baseline.  DOA: 07/26/2021 LOS: 3  Chief complaints:  Chief Complaint  Patient presents with   Loss of Consciousness     Brief Narrative / Interim history: 65 y.o. male with medical history significant of HTN, HLD, BPH and obesity presenting to ED with syncopal event. He was playing basketball at the [employer] around 7:00AM. After 30 minutes he needed to sit down because he was breathing hard, came to the ER and was diagnosed with NSTEMI.  LHC with three-vessel disease.  Plan for CABG on 07/31/2021  Subjective: Seen and examined earlier this morning.  No major events overnight of this morning.  No complaints.  He denies chest pain, dyspnea, palpitation, orthopnea, PND, GI or UTI symptoms.  Objective: Vitals:   07/30/21 0403 07/30/21 0800 07/30/21 1127 07/30/21 1300  BP: (!) 153/81 135/62 (!) 141/80   Pulse: (!) 51 (!) 55 (!) 53   Resp: 20 18 16    Temp: 98.1 F (36.7 C) 98.4 F (36.9 C) 97.8 F (36.6 C)   TempSrc: Oral Oral Oral   SpO2: 98% 98% 97%   Weight: 90.7 kg     Height:    5\' 9"  (1.753 m)    Intake/Output Summary (Last 24 hours) at 07/30/2021 1617 Last data filed at 07/30/2021 1500 Gross per 24 hour  Intake 876.01 ml  Output 2250 ml  Net -1373.99 ml   Filed Weights   07/28/21 0402 07/29/21 0411 07/30/21 0403  Weight: 91.2 kg 91.5 kg 90.7 kg    Examination:  GENERAL: No apparent distress.  Nontoxic. HEENT: MMM.  Vision and hearing grossly intact.  NECK: Supple.  No apparent JVD.  RESP: 98% on RA.  No IWOB.  Fair aeration bilaterally. CVS: Sinus bradycardia to 50.  Heart sounds normal.  ABD/GI/GU: BS+. Abd soft, NTND.  MSK/EXT:  Moves extremities. No apparent deformity. No edema.  SKIN: no apparent skin lesion or wound NEURO: Awake, alert and oriented appropriately.  No apparent focal neuro deficit. PSYCH: Calm. Normal  affect.   Procedures:  9/19-LHC with three-vessel disease.  Microbiology summarized: COVID-19 and influenza PCR nonreactive.  Assessment & Plan: Non-STEMI-no personal or family history of cardiac disease.  Pretty healthy and active other than HTN and HLD.  History of statin intolerance.  TTE basically normal.  LHC with three-vessel disease.  Currently asymptomatic. -Plan for CABG on 9/22. -Heparin infusion, Imdur, Avapro, Crestor and aspirin -Not on beta-blocker due to baseline bradycardia  Syncope post exertion while playing basketball-likely due to the above.  TTE reassuring.  Dyslipidemia-cholesterol 217, HDL 40, LDL 158, TG 96, VLDL 19.  Reported statin allergy but tolerating Crestor now.  -Consider PCSK9 inhibitors if he does not tolerate Crestor.   Essential hypertension: BP slightly elevated. -Cardiac meds as above -Continue amlodipine as well  Sinus bradycardia-reportedly baseline in 40s at rest.  Normally not symptomatic -Avoiding beta-blocker   History of B12 deficiency -Replenish  Overweight Body mass index is 29.53 kg/m.  -Very active at baseline.       DVT prophylaxis:    On heparin infusion for non-STEMI  Code Status: Full code Family Communication: Updated patient's wife at bedside Level of care: Telemetry Cardiac Status is: Inpatient  Remains inpatient appropriate because:Ongoing diagnostic testing needed not appropriate for outpatient work up, IV treatments appropriate due to intensity of illness or inability to take PO, and  Inpatient level of care appropriate due to severity of illness  Dispo: The patient is from: Home              Anticipated d/c is to: Home              Patient currently is not medically stable to d/c.   Difficult to place patient No       Consultants:  Cardiology Cardiothoracic surgery   Sch Meds:  Scheduled Meds:  amLODipine  10 mg Oral Daily   aspirin EC  81 mg Oral Daily   cholecalciferol  1,000 Units Oral Daily    [START ON 07/31/2021] epinephrine  0-10 mcg/min Intravenous To OR   [START ON 07/31/2021] heparin-papaverine-plasmalyte irrigation   Irrigation To OR   [START ON 07/31/2021] insulin   Intravenous To OR   irbesartan  150 mg Oral Daily   isosorbide mononitrate  60 mg Oral Daily   magnesium oxide  200 mg Oral Daily   [START ON 07/31/2021] magnesium sulfate  40 mEq Other To OR   [START ON 07/31/2021] phenylephrine  30-200 mcg/min Intravenous To OR   [START ON 07/31/2021] potassium chloride  80 mEq Other To OR   rosuvastatin  40 mg Oral q1800   sodium chloride flush  3 mL Intravenous Q12H   [START ON 07/31/2021] tranexamic acid  15 mg/kg Intravenous To OR   [START ON 07/31/2021] tranexamic acid  2 mg/kg Intracatheter To OR   cyanocobalamin  1,000 mcg Oral Daily   Continuous Infusions:  sodium chloride     [START ON 07/31/2021]  ceFAZolin (ANCEF) IV     [START ON 07/31/2021]  ceFAZolin (ANCEF) IV     [START ON 07/31/2021] dexmedetomidine     [START ON 07/31/2021] heparin 30,000 units/NS 1000 mL solution for CELLSAVER     heparin 1,200 Units/hr (07/30/21 1300)   [START ON 07/31/2021] milrinone     [START ON 07/31/2021] nitroGLYCERIN     [START ON 07/31/2021] norepinephrine     [START ON 07/31/2021] tranexamic acid (CYKLOKAPRON) infusion ([facility])     [START ON 07/31/2021] vancomycin     PRN Meds:.sodium chloride, acetaminophen, nitroGLYCERIN, ondansetron (ZOFRAN) IV, sodium chloride flush  Antimicrobials: Anti-infectives (From admission, onward)    Start     Dose/Rate Route Frequency Ordered Stop   07/31/21 0400  vancomycin (VANCOREADY) IVPB 1500 mg/300 mL        1,500 mg 150 mL/hr over 120 Minutes Intravenous To Surgery 07/30/21 0942 08/01/21 0400   07/31/21 0400  ceFAZolin (ANCEF) IVPB 2g/100 mL premix        2 g 200 mL/hr over 30 Minutes Intravenous To Surgery 07/30/21 0942 08/01/21 0400   07/31/21 0400  ceFAZolin (ANCEF) IVPB 2g/100 mL premix        2 g 200 mL/hr over 30 Minutes Intravenous To  Surgery 07/30/21 0942 08/01/21 0400        I have personally reviewed the following labs and images: CBC: Recent Labs  Lab 07/26/21 0955 07/27/21 0246 07/28/21 0501 07/29/21 0337 07/30/21 0350  WBC 10.6* 4.9 6.3 6.1 5.6  HGB 15.3 15.0 14.7 13.7 13.8  HCT 46.9 44.5 45.5 41.8 41.4  MCV 84.5 82.9 84.9 84.4 83.8  PLT 321 328 337 330 316   BMP &GFR Recent Labs  Lab 07/26/21 0955 07/27/21 0246 07/29/21 0337  NA 138 140 138  K 4.4 3.8 3.9  CL 107 106 107  CO2 25 24 22   GLUCOSE 107* 100* 96  BUN 11 13  10  CREATININE 1.16 1.15 1.02  CALCIUM 9.4 8.9 9.0  MG  --  2.3  --    Estimated Creatinine Clearance: 78.2 mL/min (by C-G formula based on SCr of 1.02 mg/dL). Liver & Pancreas: No results for input(s): AST, ALT, ALKPHOS, BILITOT, PROT, ALBUMIN in the last 168 hours. No results for input(s): LIPASE, AMYLASE in the last 168 hours. No results for input(s): AMMONIA in the last 168 hours. Diabetic: No results for input(s): HGBA1C in the last 72 hours. No results for input(s): GLUCAP in the last 168 hours. Cardiac Enzymes: No results for input(s): CKTOTAL, CKMB, CKMBINDEX, TROPONINI in the last 168 hours. No results for input(s): PROBNP in the last 8760 hours. Coagulation Profile: Recent Labs  Lab 07/30/21 1217  INR 1.0   Thyroid Function Tests: No results for input(s): TSH, T4TOTAL, FREET4, T3FREE, THYROIDAB in the last 72 hours. Lipid Profile: No results for input(s): CHOL, HDL, LDLCALC, TRIG, CHOLHDL, LDLDIRECT in the last 72 hours. Anemia Panel: No results for input(s): VITAMINB12, FOLATE, FERRITIN, TIBC, IRON, RETICCTPCT in the last 72 hours. Urine analysis:    Component Value Date/Time   COLORURINE YELLOW 07/30/2021 [place] 07/30/2021 1344   LABSPEC 1.020 07/30/2021 1344   PHURINE 6.0 07/30/2021 1344   GLUCOSEU NEGATIVE 07/30/2021 1344   HGBUR NEGATIVE 07/30/2021 1344   BILIRUBINUR NEGATIVE 07/30/2021 1344   BILIRUBINUR negative 03/24/2021  [place] 07/30/2021 1344   PROTEINUR NEGATIVE 07/30/2021 1344   UROBILINOGEN 0.2 03/24/2021 0959   NITRITE NEGATIVE 07/30/2021 1344   LEUKOCYTESUR NEGATIVE 07/30/2021 1344   Sepsis Labs: Invalid input(s): PROCALCITONIN, [place]  Microbiology: Recent Results (from the past 240 hour(s))  Resp Panel by RT-PCR (Flu A&B, Covid) Nasopharyngeal Swab     Status: None   Collection Time: 07/26/21  8:17 PM   Specimen: Nasopharyngeal Swab; Nasopharyngeal(NP) swabs in vial transport medium  Result Value Ref Range Status   SARS Coronavirus 2 by RT PCR NEGATIVE NEGATIVE Final    Comment: (NOTE) SARS-CoV-2 target nucleic acids are NOT DETECTED.  The SARS-CoV-2 RNA is generally detectable in upper respiratory specimens during the acute phase of infection. The lowest concentration of SARS-CoV-2 viral copies this assay can detect is 138 copies/mL. A negative result does not preclude SARS-Cov-2 infection and should not be used as the sole basis for treatment or other patient management decisions. A negative result may occur with  improper specimen collection/handling, submission of specimen other than nasopharyngeal swab, presence of viral mutation(s) within the areas targeted by this assay, and inadequate number of viral copies(<138 copies/mL). A negative result must be combined with clinical observations, patient history, and epidemiological information. The expected result is Negative.  Fact Sheet for Patients:  EntrepreneurPulse.com.au  Fact Sheet for Healthcare Providers:  IncredibleEmployment.be  This test is no t yet approved or cleared by the [place] FDA and  has been authorized for detection and/or diagnosis of SARS-CoV-2 by FDA under an Emergency Use Authorization (EUA). This EUA will remain  in effect (meaning this test can be used) for the duration of the COVID-19 declaration under Section 564(b)(1) of the Act,  21 U.S.C.section 360bbb-3(b)(1), unless the authorization is terminated  or revoked sooner.       Influenza A by PCR NEGATIVE NEGATIVE Final   Influenza B by PCR NEGATIVE NEGATIVE Final    Comment: (NOTE) The Xpert Xpress SARS-CoV-2/FLU/RSV plus assay is intended as an aid in the diagnosis of influenza from Nasopharyngeal swab specimens and should not  be used as a sole basis for treatment. Nasal washings and aspirates are unacceptable for Xpert Xpress SARS-CoV-2/FLU/RSV testing.  Fact Sheet for Patients: EntrepreneurPulse.com.au  Fact Sheet for Healthcare Providers: IncredibleEmployment.be  This test is not yet approved or cleared by the [place] FDA and has been authorized for detection and/or diagnosis of SARS-CoV-2 by FDA under an Emergency Use Authorization (EUA). This EUA will remain in effect (meaning this test can be used) for the duration of the COVID-19 declaration under Section 564(b)(1) of the Act, 21 U.S.C. section 360bbb-3(b)(1), unless the authorization is terminated or revoked.  Performed at [facility], [place] [address], [place], [place] [address]   Surgical pcr screen     Status: None   Collection Time: 07/30/21  1:06 PM   Specimen: Nasal Mucosa; Nasal Swab  Result Value Ref Range Status   MRSA, PCR NEGATIVE NEGATIVE Final   Staphylococcus aureus NEGATIVE NEGATIVE Final    Comment: (NOTE) The Xpert SA Assay (FDA approved for NASAL specimens in patients 10 years of age and older), is one component of a comprehensive surveillance program. It is not intended to diagnose infection nor to guide or monitor treatment. Performed at [facility], [place] [address], [place], [place] [address]     Radiology Studies: VAS US DOPPLER PRE CABG  Result Date: 07/30/2021 PREOPERATIVE VASCULAR EVALUATION Patient Name:  Alexander Quinn  Date of Exam:   07/30/2021 Medical Rec #: [id number]         Accession #:    [id number]  Date of Birth: 27-Jul-1956          Patient Gender: M Patient Age:   65 years Exam Location:  [facility] Procedure:      VAS US DOPPLER PRE CABG Referring Phys: HARRELL LIGHTFOOT --------------------------------------------------------------------------------  Indications:      Pre-CABG. Risk Factors:     Hypertension, hyperlipidemia, past history of smoking,                   coronary artery disease. Comparison Study: No prior study Performing Technologist: Maudry Mayhew MHA, RVT, RDCS, RDMS  Examination Guidelines: A complete evaluation includes B-mode imaging, spectral Doppler, color Doppler, and power Doppler as needed of all accessible portions of each vessel. Bilateral testing is considered an integral part of a complete examination. Limited examinations for reoccurring indications may be performed as noted.  Right Carotid Findings: +----------+--------+-------+--------+----------------------+------------------+           PSV cm/sEDV    StenosisDescribe              Comments                             cm/s                                                    +----------+--------+-------+--------+----------------------+------------------+ CCA Prox  138     18                                   intimal thickening +----------+--------+-------+--------+----------------------+------------------+ CCA Distal115     11             smooth and  heterogenous                             +----------+--------+-------+--------+----------------------+------------------+ ICA Prox  127     29             smooth and                                                                heterogenous                             +----------+--------+-------+--------+----------------------+------------------+ ICA Distal97      22                                                       +----------+--------+-------+--------+----------------------+------------------+ ECA       174     13                                                      +----------+--------+-------+--------+----------------------+------------------+ +----------+--------+-------+----------------+------------+           PSV cm/sEDV cmsDescribe        Arm Pressure +----------+--------+-------+----------------+------------+ Subclavian175            Multiphasic, WNL             +----------+--------+-------+----------------+------------+ +---------+--------+--+--------+--+---------+ VertebralPSV cm/s78EDV cm/s19Antegrade +---------+--------+--+--------+--+---------+ Left Carotid Findings: +----------+--------+--------+--------+--------------------------+--------+           PSV cm/sEDV cm/sStenosisDescribe                  Comments +----------+--------+--------+--------+--------------------------+--------+ CCA Prox  107     16              homogeneous and hypoechoic         +----------+--------+--------+--------+--------------------------+--------+ CCA Distal119     17              smooth and heterogenous            +----------+--------+--------+--------+--------------------------+--------+ ICA Prox  97      25              heterogenous and smooth            +----------+--------+--------+--------+--------------------------+--------+ ICA Distal87      24                                                 +----------+--------+--------+--------+--------------------------+--------+ ECA       164     13                                                 +----------+--------+--------+--------+--------------------------+--------+  +----------+--------+--------+----------------+------------+ SubclavianPSV cm/sEDV cm/sDescribe  Arm Pressure +----------+--------+--------+----------------+------------+           187             Multiphasic, WNL              +----------+--------+--------+----------------+------------+ +---------+--------+--+--------+--+---------+ VertebralPSV cm/s51EDV cm/s13Antegrade +---------+--------+--+--------+--+---------+  ABI Findings: +--------+------------------+-----+---------+--------+ Right   Rt Pressure (mmHg)IndexWaveform Comment  +--------+------------------+-----+---------+--------+ Brachial140                    triphasic         +--------+------------------+-----+---------+--------+ PTA                            triphasic         +--------+------------------+-----+---------+--------+ DP                             triphasic         +--------+------------------+-----+---------+--------+ +--------+------------------+-----+---------+----------------------------------+ Left    Lt Pressure (mmHg)IndexWaveform Comment                            +--------+------------------+-----+---------+----------------------------------+ Brachial                       triphasicUnable to obtain pressure due to                                           bandaging.                         +--------+------------------+-----+---------+----------------------------------+ PTA                            triphasic                                   +--------+------------------+-----+---------+----------------------------------+ DP                             triphasic                                   +--------+------------------+-----+---------+----------------------------------+  Right Doppler Findings: +-----------+--------+-----+---------+--------------------+ Site       PressureIndexDoppler  Comments             +-----------+--------+-----+---------+--------------------+ Brachial   140          triphasic                     +-----------+--------+-----+---------+--------------------+ Radial                  triphasic                      +-----------+--------+-----+---------+--------------------+ Ulnar                   triphasic                     +-----------+--------+-----+---------+--------------------+ Palmar Arch                      Within normal  limits +-----------+--------+-----+---------+--------------------+  Left Doppler Findings: +-----------+--------+-----+---------+----------------------------------------+ Site       PressureIndexDoppler  Comments                                 +-----------+--------+-----+---------+----------------------------------------+ Brachial                triphasicUnable to obtain pressure due to                                          bandaging.                               +-----------+--------+-----+---------+----------------------------------------+ Radial                  triphasic                                         +-----------+--------+-----+---------+----------------------------------------+ Ulnar                   triphasic                                         +-----------+--------+-----+---------+----------------------------------------+ Palmar Arch                      Within normal limits                     +-----------+--------+-----+---------+----------------------------------------+  Summary: Right Carotid: Velocities in the right ICA are consistent with a 1-39% stenosis. Left Carotid: Velocities in the left ICA are consistent with a 1-39% stenosis. Vertebrals:  Bilateral vertebral arteries demonstrate antegrade flow. Subclavians: Normal flow hemodynamics were seen in bilateral subclavian              arteries. Pedal pulses: Pedal waveforms triphasic at rest bilaterally. Right Upper Extremity: Doppler waveforms remain within normal limits with right radial compression. Doppler waveforms remain within normal limits with right ulnar compression. Left Upper Extremity: Doppler waveforms remain within normal limits with left radial  compression. Doppler waveforms remain within normal limits with left ulnar compression.     Preliminary       Meredeth Furber T. [place]  If 7PM-7AM, please contact night-coverage [email or web address] 07/30/2021, 4:17 PM

## 2021-07-31 ENCOUNTER — Inpatient Hospital Stay (HOSPITAL_COMMUNITY): Payer: 59 | Admitting: Certified Registered"

## 2021-07-31 ENCOUNTER — Inpatient Hospital Stay (HOSPITAL_COMMUNITY): Payer: 59

## 2021-07-31 ENCOUNTER — Inpatient Hospital Stay (HOSPITAL_COMMUNITY)
Admission: EM | Disposition: A | Discharge status: Home / Self Care | Payer: Self-pay | Source: Home / Self Care | Attending: Thoracic Surgery (Cardiothoracic Vascular Surgery)

## 2021-07-31 DIAGNOSIS — I2510 Atherosclerotic heart disease of native coronary artery without angina pectoris: Secondary | ICD-10-CM

## 2021-07-31 DIAGNOSIS — Z951 Presence of aortocoronary bypass graft: Secondary | ICD-10-CM

## 2021-07-31 HISTORY — PX: CORONARY ARTERY BYPASS GRAFT: SHX141

## 2021-07-31 HISTORY — PX: TEE WITHOUT CARDIOVERSION: SHX5443

## 2021-07-31 HISTORY — PX: ENDOVEIN HARVEST OF GREATER SAPHENOUS VEIN: SHX5059

## 2021-07-31 HISTORY — PX: ENDARTERECTOMY: SHX5162

## 2021-07-31 LAB — POCT I-STAT, CHEM 8
BUN: 12 mg/dL (ref 8–23)
BUN: 12 mg/dL (ref 8–23)
BUN: 12 mg/dL (ref 8–23)
BUN: 11 mg/dL (ref 8–23)
BUN: 11 mg/dL (ref 8–23)
Calcium, Ion: 1.27 mmol/L (ref 1.15–1.40)
Calcium, Ion: 1.25 mmol/L (ref 1.15–1.40)
Calcium, Ion: 1.08 mmol/L — ABNORMAL LOW (ref 1.15–1.40)
Calcium, Ion: 1.04 mmol/L — ABNORMAL LOW (ref 1.15–1.40)
Calcium, Ion: 1.03 mmol/L — ABNORMAL LOW (ref 1.15–1.40)
Chloride: 105 mmol/L (ref 98–111)
Chloride: 105 mmol/L (ref 98–111)
Chloride: 107 mmol/L (ref 98–111)
Chloride: 106 mmol/L (ref 98–111)
Chloride: 106 mmol/L (ref 98–111)
Creatinine, Ser: 0.8 mg/dL (ref 0.61–1.24)
Creatinine, Ser: 1 mg/dL (ref 0.61–1.24)
Creatinine, Ser: 0.9 mg/dL (ref 0.61–1.24)
Creatinine, Ser: 0.9 mg/dL (ref 0.61–1.24)
Creatinine, Ser: 0.8 mg/dL (ref 0.61–1.24)
Glucose, Bld: 97 mg/dL (ref 70–99)
Glucose, Bld: 105 mg/dL — ABNORMAL HIGH (ref 70–99)
Glucose, Bld: 135 mg/dL — ABNORMAL HIGH (ref 70–99)
Glucose, Bld: 137 mg/dL — ABNORMAL HIGH (ref 70–99)
Glucose, Bld: 141 mg/dL — ABNORMAL HIGH (ref 70–99)
HCT: 41 % (ref 39.0–52.0)
HCT: 40 % (ref 39.0–52.0)
HCT: 30 % — ABNORMAL LOW (ref 39.0–52.0)
HCT: 29 % — ABNORMAL LOW (ref 39.0–52.0)
HCT: 28 % — ABNORMAL LOW (ref 39.0–52.0)
Hemoglobin: 13.9 g/dL (ref 13.0–17.0)
Hemoglobin: 13.6 g/dL (ref 13.0–17.0)
Hemoglobin: 10.2 g/dL — ABNORMAL LOW (ref 13.0–17.0)
Hemoglobin: 9.9 g/dL — ABNORMAL LOW (ref 13.0–17.0)
Hemoglobin: 9.5 g/dL — ABNORMAL LOW (ref 13.0–17.0)
Potassium: 3.8 mmol/L (ref 3.5–5.1)
Potassium: 4.1 mmol/L (ref 3.5–5.1)
Potassium: 5.1 mmol/L (ref 3.5–5.1)
Potassium: 5.2 mmol/L — ABNORMAL HIGH (ref 3.5–5.1)
Potassium: 4.4 mmol/L (ref 3.5–5.1)
Sodium: 141 mmol/L (ref 135–145)
Sodium: 139 mmol/L (ref 135–145)
Sodium: 139 mmol/L (ref 135–145)
Sodium: 138 mmol/L (ref 135–145)
Sodium: 139 mmol/L (ref 135–145)
TCO2: 27 mmol/L (ref 22–32)
TCO2: 24 mmol/L (ref 22–32)
TCO2: 26 mmol/L (ref 22–32)
TCO2: 25 mmol/L (ref 22–32)
TCO2: 23 mmol/L (ref 22–32)

## 2021-07-31 LAB — POCT I-STAT 7, (LYTES, BLD GAS, ICA,H+H)
Acid-Base Excess: 0 mmol/L (ref 0.0–2.0)
Acid-Base Excess: 0 mmol/L (ref 0.0–2.0)
Acid-Base Excess: 0 mmol/L (ref 0.0–2.0)
Acid-base deficit: 1 mmol/L (ref 0.0–2.0)
Acid-base deficit: 3 mmol/L — ABNORMAL HIGH (ref 0.0–2.0)
Acid-base deficit: 4 mmol/L — ABNORMAL HIGH (ref 0.0–2.0)
Acid-base deficit: 6 mmol/L — ABNORMAL HIGH (ref 0.0–2.0)
Acid-base deficit: 7 mmol/L — ABNORMAL HIGH (ref 0.0–2.0)
Bicarbonate: 25.7 mmol/L (ref 20.0–28.0)
Bicarbonate: 24.4 mmol/L (ref 20.0–28.0)
Bicarbonate: 24.7 mmol/L (ref 20.0–28.0)
Bicarbonate: 21.8 mmol/L (ref 20.0–28.0)
Bicarbonate: 23.8 mmol/L (ref 20.0–28.0)
Bicarbonate: 22.7 mmol/L (ref 20.0–28.0)
Bicarbonate: 19.3 mmol/L — ABNORMAL LOW (ref 20.0–28.0)
Bicarbonate: 19.2 mmol/L — ABNORMAL LOW (ref 20.0–28.0)
Calcium, Ion: 1.26 mmol/L (ref 1.15–1.40)
Calcium, Ion: 1 mmol/L — ABNORMAL LOW (ref 1.15–1.40)
Calcium, Ion: 1 mmol/L — ABNORMAL LOW (ref 1.15–1.40)
Calcium, Ion: 0.99 mmol/L — ABNORMAL LOW (ref 1.15–1.40)
Calcium, Ion: 1.01 mmol/L — ABNORMAL LOW (ref 1.15–1.40)
Calcium, Ion: 0.99 mmol/L — ABNORMAL LOW (ref 1.15–1.40)
Calcium, Ion: 1.08 mmol/L — ABNORMAL LOW (ref 1.15–1.40)
Calcium, Ion: 1.1 mmol/L — ABNORMAL LOW (ref 1.15–1.40)
HCT: 43 % (ref 39.0–52.0)
HCT: 29 % — ABNORMAL LOW (ref 39.0–52.0)
HCT: 27 % — ABNORMAL LOW (ref 39.0–52.0)
HCT: 27 % — ABNORMAL LOW (ref 39.0–52.0)
HCT: 29 % — ABNORMAL LOW (ref 39.0–52.0)
HCT: 36 % — ABNORMAL LOW (ref 39.0–52.0)
HCT: 34 % — ABNORMAL LOW (ref 39.0–52.0)
HCT: 34 % — ABNORMAL LOW (ref 39.0–52.0)
Hemoglobin: 14.6 g/dL (ref 13.0–17.0)
Hemoglobin: 9.9 g/dL — ABNORMAL LOW (ref 13.0–17.0)
Hemoglobin: 9.2 g/dL — ABNORMAL LOW (ref 13.0–17.0)
Hemoglobin: 9.2 g/dL — ABNORMAL LOW (ref 13.0–17.0)
Hemoglobin: 9.9 g/dL — ABNORMAL LOW (ref 13.0–17.0)
Hemoglobin: 12.2 g/dL — ABNORMAL LOW (ref 13.0–17.0)
Hemoglobin: 11.6 g/dL — ABNORMAL LOW (ref 13.0–17.0)
Hemoglobin: 11.6 g/dL — ABNORMAL LOW (ref 13.0–17.0)
O2 Saturation: 100 %
O2 Saturation: 100 %
O2 Saturation: 100 %
O2 Saturation: 100 %
O2 Saturation: 99 %
O2 Saturation: 93 %
O2 Saturation: 95 %
O2 Saturation: 97 %
Patient temperature: 36
Patient temperature: 36.9
Patient temperature: 37.3
Potassium: 3.8 mmol/L (ref 3.5–5.1)
Potassium: 4.4 mmol/L (ref 3.5–5.1)
Potassium: 5.1 mmol/L (ref 3.5–5.1)
Potassium: 4.4 mmol/L (ref 3.5–5.1)
Potassium: 5.3 mmol/L — ABNORMAL HIGH (ref 3.5–5.1)
Potassium: 3.9 mmol/L (ref 3.5–5.1)
Potassium: 4.7 mmol/L (ref 3.5–5.1)
Potassium: 4.7 mmol/L (ref 3.5–5.1)
Sodium: 141 mmol/L (ref 135–145)
Sodium: 140 mmol/L (ref 135–145)
Sodium: 139 mmol/L (ref 135–145)
Sodium: 138 mmol/L (ref 135–145)
Sodium: 140 mmol/L (ref 135–145)
Sodium: 142 mmol/L (ref 135–145)
Sodium: 139 mmol/L (ref 135–145)
Sodium: 139 mmol/L (ref 135–145)
TCO2: 27 mmol/L (ref 22–32)
TCO2: 26 mmol/L (ref 22–32)
TCO2: 26 mmol/L (ref 22–32)
TCO2: 23 mmol/L (ref 22–32)
TCO2: 25 mmol/L (ref 22–32)
TCO2: 24 mmol/L (ref 22–32)
TCO2: 20 mmol/L — ABNORMAL LOW (ref 22–32)
TCO2: 20 mmol/L — ABNORMAL LOW (ref 22–32)
pCO2 arterial: 45.4 mmHg (ref 32.0–48.0)
pCO2 arterial: 38.8 mmHg (ref 32.0–48.0)
pCO2 arterial: 38.8 mmHg (ref 32.0–48.0)
pCO2 arterial: 38.4 mmHg (ref 32.0–48.0)
pCO2 arterial: 39.4 mmHg (ref 32.0–48.0)
pCO2 arterial: 42.7 mmHg (ref 32.0–48.0)
pCO2 arterial: 35 mmHg (ref 32.0–48.0)
pCO2 arterial: 39.2 mmHg (ref 32.0–48.0)
pH, Arterial: 7.36 (ref 7.350–7.450)
pH, Arterial: 7.406 (ref 7.350–7.450)
pH, Arterial: 7.412 (ref 7.350–7.450)
pH, Arterial: 7.362 (ref 7.350–7.450)
pH, Arterial: 7.389 (ref 7.350–7.450)
pH, Arterial: 7.328 — ABNORMAL LOW (ref 7.350–7.450)
pH, Arterial: 7.349 — ABNORMAL LOW (ref 7.350–7.450)
pH, Arterial: 7.301 — ABNORMAL LOW (ref 7.350–7.450)
pO2, Arterial: 452 mmHg — ABNORMAL HIGH (ref 83.0–108.0)
pO2, Arterial: 384 mmHg — ABNORMAL HIGH (ref 83.0–108.0)
pO2, Arterial: 394 mmHg — ABNORMAL HIGH (ref 83.0–108.0)
pO2, Arterial: 145 mmHg — ABNORMAL HIGH (ref 83.0–108.0)
pO2, Arterial: 323 mmHg — ABNORMAL HIGH (ref 83.0–108.0)
pO2, Arterial: 67 mmHg — ABNORMAL LOW (ref 83.0–108.0)
pO2, Arterial: 80 mmHg — ABNORMAL LOW (ref 83.0–108.0)
pO2, Arterial: 101 mmHg (ref 83.0–108.0)

## 2021-07-31 LAB — POCT I-STAT EG7
Acid-Base Excess: 0 mmol/L (ref 0.0–2.0)
Bicarbonate: 24.7 mmol/L (ref 20.0–28.0)
Calcium, Ion: 1.03 mmol/L — ABNORMAL LOW (ref 1.15–1.40)
HCT: 31 % — ABNORMAL LOW (ref 39.0–52.0)
Hemoglobin: 10.5 g/dL — ABNORMAL LOW (ref 13.0–17.0)
O2 Saturation: 77 %
Potassium: 4 mmol/L (ref 3.5–5.1)
Sodium: 140 mmol/L (ref 135–145)
TCO2: 26 mmol/L (ref 22–32)
pCO2, Ven: 41.3 mmHg — ABNORMAL LOW (ref 44.0–60.0)
pH, Ven: 7.384 (ref 7.250–7.430)
pO2, Ven: 42 mmHg (ref 32.0–45.0)

## 2021-07-31 LAB — BASIC METABOLIC PANEL
Anion gap: 9 (ref 5–15)
Anion gap: 7 (ref 5–15)
BUN: 13 mg/dL (ref 8–23)
BUN: 10 mg/dL (ref 8–23)
CO2: 24 mmol/L (ref 22–32)
CO2: 19 mmol/L — ABNORMAL LOW (ref 22–32)
Calcium: 9.2 mg/dL (ref 8.9–10.3)
Calcium: 7.5 mg/dL — ABNORMAL LOW (ref 8.9–10.3)
Chloride: 105 mmol/L (ref 98–111)
Chloride: 110 mmol/L (ref 98–111)
Creatinine, Ser: 1.09 mg/dL (ref 0.61–1.24)
Creatinine, Ser: 1.12 mg/dL (ref 0.61–1.24)
GFR, Estimated: >60 mL/min (ref >60)
GFR, Estimated: >60 mL/min (ref >60)
Glucose, Bld: 98 mg/dL (ref 70–99)
Glucose, Bld: 140 mg/dL — ABNORMAL HIGH (ref 70–99)
Potassium: 3.6 mmol/L (ref 3.5–5.1)
Potassium: 4.7 mmol/L (ref 3.5–5.1)
Sodium: 138 mmol/L (ref 135–145)
Sodium: 136 mmol/L (ref 135–145)

## 2021-07-31 LAB — HEPARIN LEVEL (UNFRACTIONATED): Heparin Unfractionated: 0.28 IU/mL — ABNORMAL LOW (ref 0.30–0.70)

## 2021-07-31 LAB — CBC
HCT: 43.9 % (ref 39.0–52.0)
HCT: 35.9 % — ABNORMAL LOW (ref 39.0–52.0)
HCT: 35.4 % — ABNORMAL LOW (ref 39.0–52.0)
Hemoglobin: 14.5 g/dL (ref 13.0–17.0)
Hemoglobin: 12.1 g/dL — ABNORMAL LOW (ref 13.0–17.0)
Hemoglobin: 11.9 g/dL — ABNORMAL LOW (ref 13.0–17.0)
MCH: 27.7 pg (ref 26.0–34.0)
MCH: 28.3 pg (ref 26.0–34.0)
MCH: 28.3 pg (ref 26.0–34.0)
MCHC: 33 g/dL (ref 30.0–36.0)
MCHC: 33.7 g/dL (ref 30.0–36.0)
MCHC: 33.6 g/dL (ref 30.0–36.0)
MCV: 83.8 fL (ref 80.0–100.0)
MCV: 83.9 fL (ref 80.0–100.0)
MCV: 84.3 fL (ref 80.0–100.0)
Platelets: 321 10*3/uL (ref 150–400)
Platelets: 224 10*3/uL (ref 150–400)
Platelets: 245 10*3/uL (ref 150–400)
RBC: 5.24 MIL/uL (ref 4.22–5.81)
RBC: 4.28 MIL/uL (ref 4.22–5.81)
RBC: 4.2 MIL/uL — ABNORMAL LOW (ref 4.22–5.81)
RDW: 13.6 % (ref 11.5–15.5)
RDW: 13.4 % (ref 11.5–15.5)
RDW: 13.6 % (ref 11.5–15.5)
WBC: 5.4 10*3/uL (ref 4.0–10.5)
WBC: 15.8 10*3/uL — ABNORMAL HIGH (ref 4.0–10.5)
WBC: 15 10*3/uL — ABNORMAL HIGH (ref 4.0–10.5)
nRBC: 0 % (ref 0.0–0.2)
nRBC: 0 % (ref 0.0–0.2)
nRBC: 0 % (ref 0.0–0.2)

## 2021-07-31 LAB — GLUCOSE, CAPILLARY
Glucose-Capillary: 101 mg/dL — ABNORMAL HIGH (ref 70–99)
Glucose-Capillary: 114 mg/dL — ABNORMAL HIGH (ref 70–99)
Glucose-Capillary: 107 mg/dL — ABNORMAL HIGH (ref 70–99)
Glucose-Capillary: 121 mg/dL — ABNORMAL HIGH (ref 70–99)
Glucose-Capillary: 127 mg/dL — ABNORMAL HIGH (ref 70–99)

## 2021-07-31 LAB — MAGNESIUM
Magnesium: 2.2 mg/dL (ref 1.7–2.4)
Magnesium: 3.3 mg/dL — ABNORMAL HIGH (ref 1.7–2.4)

## 2021-07-31 LAB — ECHO INTRAOPERATIVE TEE
Height: 69 in
Weight: 3158.4 oz

## 2021-07-31 LAB — HEMOGLOBIN AND HEMATOCRIT, BLOOD
HCT: 29.9 % — ABNORMAL LOW (ref 39.0–52.0)
Hemoglobin: 10 g/dL — ABNORMAL LOW (ref 13.0–17.0)

## 2021-07-31 LAB — PROTIME-INR
INR: 1 (ref 0.8–1.2)
INR: 1.3 — ABNORMAL HIGH (ref 0.8–1.2)
Prothrombin Time: 13.2 seconds (ref 11.4–15.2)
Prothrombin Time: 15.9 seconds — ABNORMAL HIGH (ref 11.4–15.2)

## 2021-07-31 LAB — PLATELET COUNT: Platelets: 213 10*3/uL (ref 150–400)

## 2021-07-31 LAB — APTT: aPTT: 32 seconds (ref 24–36)

## 2021-07-31 SURGERY — CORONARY ARTERY BYPASS GRAFTING (CABG)
Anesthesia: General | Site: Chest | Laterality: Right

## 2021-07-31 MED ORDER — LACTATED RINGERS IV SOLN: INTRAVENOUS | Status: DC

## 2021-07-31 MED ORDER — STERILE WATER FOR IRRIGATION IR SOLN
Status: DC | PRN
  Administered 2021-07-31: 2000 mL

## 2021-07-31 MED ORDER — MIDAZOLAM HCL (PF) 10 MG/2ML IJ SOLN
INTRAMUSCULAR | Status: AC
  Filled 2021-07-31: qty 2

## 2021-07-31 MED ORDER — SODIUM CHLORIDE 0.9 % IV SOLN: 250.0000 mL | INTRAVENOUS | Status: DC

## 2021-07-31 MED ORDER — OXYCODONE HCL 5 MG PO TABS
5.0000 mg | ORAL_TABLET | ORAL | Status: DC | PRN
  Administered 2021-08-01: 5 mg via ORAL
  Filled 2021-07-31: qty 1

## 2021-07-31 MED ORDER — HEPARIN SODIUM (PORCINE) 1000 UNIT/ML IJ SOLN
INTRAMUSCULAR | Status: AC
  Filled 2021-07-31: qty 1

## 2021-07-31 MED ORDER — FAMOTIDINE 20 MG IN NS 100 ML IVPB
20.0000 mg | Freq: Two times a day (BID) | INTRAVENOUS | Status: DC
  Administered 2021-07-31: 20 mg via INTRAVENOUS
  Filled 2021-07-31: qty 100

## 2021-07-31 MED ORDER — ROCURONIUM BROMIDE 10 MG/ML (PF) SYRINGE
PREFILLED_SYRINGE | INTRAVENOUS | Status: DC | PRN
  Administered 2021-07-31: 100 mg via INTRAVENOUS
  Administered 2021-07-31: 20 mg via INTRAVENOUS
  Administered 2021-07-31: 50 mg via INTRAVENOUS
  Administered 2021-07-31 (×2): 30 mg via INTRAVENOUS

## 2021-07-31 MED ORDER — ROCURONIUM BROMIDE 10 MG/ML (PF) SYRINGE
PREFILLED_SYRINGE | INTRAVENOUS | Status: AC
  Filled 2021-07-31: qty 10

## 2021-07-31 MED ORDER — SODIUM CHLORIDE 0.9% FLUSH
10.0000 mL | Freq: Two times a day (BID) | INTRAVENOUS | Status: DC
  Administered 2021-08-01 – 2021-08-03 (×6): 10 mL

## 2021-07-31 MED ORDER — PROPOFOL 10 MG/ML IV BOLUS
INTRAVENOUS | Status: AC
  Filled 2021-07-31: qty 20

## 2021-07-31 MED ORDER — ONDANSETRON HCL 4 MG/2ML IJ SOLN
4.0000 mg | Freq: Four times a day (QID) | INTRAMUSCULAR | Status: DC | PRN
  Administered 2021-08-03: 4 mg via INTRAVENOUS
  Filled 2021-07-31: qty 2

## 2021-07-31 MED ORDER — MIDAZOLAM HCL (PF) 5 MG/ML IJ SOLN
INTRAMUSCULAR | Status: DC | PRN
  Administered 2021-07-31 (×2): 1 mg via INTRAVENOUS
  Administered 2021-07-31: 4 mg via INTRAVENOUS
  Administered 2021-07-31: 2 mg via INTRAVENOUS
  Administered 2021-07-31 (×2): 1 mg via INTRAVENOUS

## 2021-07-31 MED ORDER — PROPOFOL 10 MG/ML IV BOLUS
INTRAVENOUS | Status: DC | PRN
  Administered 2021-07-31: 50 mg via INTRAVENOUS
  Administered 2021-07-31: 40 mg via INTRAVENOUS

## 2021-07-31 MED ORDER — POTASSIUM CHLORIDE 10 MEQ/50ML IV SOLN
10.0000 meq | INTRAVENOUS | Status: AC
Start: 2021-07-31 — End: 2021-07-31
  Administered 2021-07-31 (×3): 10 meq via INTRAVENOUS

## 2021-07-31 MED ORDER — ORAL CARE MOUTH RINSE
15.0000 mL | OROMUCOSAL | Status: DC
  Administered 2021-07-31: 15 mL via OROMUCOSAL

## 2021-07-31 MED ORDER — INSULIN ASPART 100 UNIT/ML IJ SOLN
0.0000 [IU] | INTRAMUSCULAR | Status: DC
  Administered 2021-07-31 – 2021-08-01 (×3): 2 [IU] via SUBCUTANEOUS

## 2021-07-31 MED ORDER — SODIUM CHLORIDE 0.45 % IV SOLN: INTRAVENOUS | Status: DC | PRN

## 2021-07-31 MED ORDER — LACTATED RINGERS IV SOLN: 500.0000 mL | Freq: Once | INTRAVENOUS | Status: DC | PRN

## 2021-07-31 MED ORDER — METOPROLOL TARTRATE 25 MG/10 ML ORAL SUSPENSION: 12.5000 mg | Freq: Two times a day (BID) | ORAL | Status: DC

## 2021-07-31 MED ORDER — DEXTROSE 50 % IV SOLN: 0.0000 mL | INTRAVENOUS | Status: DC | PRN

## 2021-07-31 MED ORDER — SODIUM CHLORIDE 0.9 % IV SOLN: INTRAVENOUS | Status: DC

## 2021-07-31 MED ORDER — ROCURONIUM BROMIDE 10 MG/ML (PF) SYRINGE
PREFILLED_SYRINGE | INTRAVENOUS | Status: AC
  Filled 2021-07-31: qty 20

## 2021-07-31 MED ORDER — PROTAMINE SULFATE 10 MG/ML IV SOLN
INTRAVENOUS | Status: AC
  Filled 2021-07-31: qty 25

## 2021-07-31 MED ORDER — PHENYLEPHRINE HCL-NACL 20-0.9 MG/250ML-% IV SOLN: 0.0000 ug/min | INTRAVENOUS | Status: DC

## 2021-07-31 MED ORDER — ASPIRIN EC 325 MG PO TBEC
325.0000 mg | DELAYED_RELEASE_TABLET | Freq: Every day | ORAL | Status: DC
  Administered 2021-08-01 – 2021-08-04 (×4): 325 mg via ORAL
  Filled 2021-07-31 (×4): qty 1

## 2021-07-31 MED ORDER — ACETAMINOPHEN 650 MG RE SUPP
650.0000 mg | Freq: Once | RECTAL | Status: AC
  Administered 2021-07-31: 650 mg via RECTAL

## 2021-07-31 MED ORDER — PHENYLEPHRINE 40 MCG/ML (10ML) SYRINGE FOR IV PUSH (FOR BLOOD PRESSURE SUPPORT)
PREFILLED_SYRINGE | INTRAVENOUS | Status: DC | PRN
  Administered 2021-07-31: 40 ug via INTRAVENOUS

## 2021-07-31 MED ORDER — 0.9 % SODIUM CHLORIDE (POUR BTL) OPTIME
TOPICAL | Status: DC | PRN
  Administered 2021-07-31: 5000 mL

## 2021-07-31 MED ORDER — MIDAZOLAM HCL 2 MG/2ML IJ SOLN: 2.0000 mg | INTRAMUSCULAR | Status: DC | PRN

## 2021-07-31 MED ORDER — FENTANYL CITRATE (PF) 250 MCG/5ML IJ SOLN
INTRAMUSCULAR | Status: DC | PRN
  Administered 2021-07-31: 100 ug via INTRAVENOUS
  Administered 2021-07-31: 50 ug via INTRAVENOUS
  Administered 2021-07-31 (×2): 100 ug via INTRAVENOUS
  Administered 2021-07-31: 150 ug via INTRAVENOUS
  Administered 2021-07-31: 100 ug via INTRAVENOUS
  Administered 2021-07-31: 150 ug via INTRAVENOUS
  Administered 2021-07-31: 50 ug via INTRAVENOUS
  Administered 2021-07-31 (×3): 100 ug via INTRAVENOUS
  Administered 2021-07-31: 150 ug via INTRAVENOUS
  Administered 2021-07-31: 100 ug via INTRAVENOUS
  Administered 2021-07-31: 150 ug via INTRAVENOUS

## 2021-07-31 MED ORDER — BISACODYL 10 MG RE SUPP: 10.0000 mg | Freq: Every day | RECTAL | Status: DC

## 2021-07-31 MED ORDER — MORPHINE SULFATE (PF) 2 MG/ML IV SOLN
1.0000 mg | INTRAVENOUS | Status: DC | PRN
  Administered 2021-07-31: 4 mg via INTRAVENOUS
  Administered 2021-07-31: 2 mg via INTRAVENOUS
  Administered 2021-07-31: 3 mg via INTRAVENOUS
  Administered 2021-07-31 (×2): 2 mg via INTRAVENOUS
  Administered 2021-08-01: 4 mg via INTRAVENOUS
  Administered 2021-08-01: 2 mg via INTRAVENOUS
  Filled 2021-07-31 (×2): qty 1
  Filled 2021-07-31 (×2): qty 2
  Filled 2021-07-31 (×2): qty 1
  Filled 2021-07-31: qty 2

## 2021-07-31 MED ORDER — SODIUM CHLORIDE (PF) 0.9 % IJ SOLN
OROMUCOSAL | Status: DC | PRN
  Administered 2021-07-31 (×3): 4 mL via TOPICAL

## 2021-07-31 MED ORDER — TRAMADOL HCL 50 MG PO TABS
50.0000 mg | ORAL_TABLET | ORAL | Status: DC | PRN
  Administered 2021-08-01 – 2021-08-02 (×5): 100 mg via ORAL
  Filled 2021-07-31 (×5): qty 2

## 2021-07-31 MED ORDER — POVIDONE-IODINE 10 % EX SOLN
CUTANEOUS | Status: DC | PRN
  Administered 2021-07-31: 1 via TOPICAL

## 2021-07-31 MED ORDER — ORAL CARE MOUTH RINSE
15.0000 mL | Freq: Two times a day (BID) | OROMUCOSAL | Status: DC
  Administered 2021-08-01 (×2): 15 mL via OROMUCOSAL

## 2021-07-31 MED ORDER — ACETAMINOPHEN 500 MG PO TABS
1000.0000 mg | ORAL_TABLET | Freq: Four times a day (QID) | ORAL | Status: DC
  Administered 2021-08-01 – 2021-08-04 (×5): 1000 mg via ORAL
  Filled 2021-07-31 (×5): qty 2

## 2021-07-31 MED ORDER — PROTAMINE SULFATE 10 MG/ML IV SOLN
INTRAVENOUS | Status: AC
  Filled 2021-07-31: qty 10

## 2021-07-31 MED ORDER — DOCUSATE SODIUM 100 MG PO CAPS
200.0000 mg | ORAL_CAPSULE | Freq: Every day | ORAL | Status: DC
  Administered 2021-08-01 – 2021-08-04 (×4): 200 mg via ORAL
  Filled 2021-07-31 (×4): qty 2

## 2021-07-31 MED ORDER — PHENYLEPHRINE 40 MCG/ML (10ML) SYRINGE FOR IV PUSH (FOR BLOOD PRESSURE SUPPORT)
PREFILLED_SYRINGE | INTRAVENOUS | Status: AC
  Filled 2021-07-31: qty 10

## 2021-07-31 MED ORDER — ORAL CARE MOUTH RINSE: 15.0000 mL | Freq: Once | OROMUCOSAL | Status: DC

## 2021-07-31 MED ORDER — HEMOSTATIC AGENTS (NO CHARGE) OPTIME
TOPICAL | Status: DC | PRN
  Administered 2021-07-31: 1 via TOPICAL

## 2021-07-31 MED ORDER — VANCOMYCIN HCL IN DEXTROSE 1-5 GM/200ML-% IV SOLN
1000.0000 mg | Freq: Once | INTRAVENOUS | Status: AC
  Administered 2021-07-31: 1000 mg via INTRAVENOUS
  Filled 2021-07-31: qty 200

## 2021-07-31 MED ORDER — LACTATED RINGERS IV SOLN: INTRAVENOUS | Status: DC | PRN

## 2021-07-31 MED ORDER — SODIUM CHLORIDE 0.9% FLUSH
3.0000 mL | Freq: Two times a day (BID) | INTRAVENOUS | Status: DC
  Administered 2021-08-01 – 2021-08-03 (×5): 3 mL via INTRAVENOUS

## 2021-07-31 MED ORDER — CHLORHEXIDINE GLUCONATE 0.12 % MT SOLN
15.0000 mL | Freq: Two times a day (BID) | OROMUCOSAL | Status: DC
  Administered 2021-07-31 – 2021-08-01 (×3): 15 mL via OROMUCOSAL
  Filled 2021-07-31 (×2): qty 15

## 2021-07-31 MED ORDER — BISACODYL 5 MG PO TBEC
10.0000 mg | DELAYED_RELEASE_TABLET | Freq: Every day | ORAL | Status: DC
  Administered 2021-08-01 – 2021-08-04 (×4): 10 mg via ORAL
  Filled 2021-07-31 (×4): qty 2

## 2021-07-31 MED ORDER — CEFAZOLIN SODIUM-DEXTROSE 2-4 GM/100ML-% IV SOLN
2.0000 g | Freq: Three times a day (TID) | INTRAVENOUS | Status: AC
  Administered 2021-07-31 – 2021-08-02 (×6): 2 g via INTRAVENOUS
  Filled 2021-07-31 (×6): qty 100

## 2021-07-31 MED ORDER — CHLORHEXIDINE GLUCONATE 0.12% ORAL RINSE (MEDLINE KIT): 15.0000 mL | Freq: Two times a day (BID) | OROMUCOSAL | Status: DC

## 2021-07-31 MED ORDER — FENTANYL CITRATE (PF) 250 MCG/5ML IJ SOLN
INTRAMUSCULAR | Status: AC
  Filled 2021-07-31: qty 25

## 2021-07-31 MED ORDER — CHLORHEXIDINE GLUCONATE 0.12 % MT SOLN
OROMUCOSAL | Status: AC
  Filled 2021-07-31: qty 15

## 2021-07-31 MED ORDER — ASPIRIN 81 MG PO CHEW: 324.0000 mg | CHEWABLE_TABLET | Freq: Every day | ORAL | Status: DC

## 2021-07-31 MED ORDER — CHLORHEXIDINE GLUCONATE 0.12 % MT SOLN
15.0000 mL | OROMUCOSAL | Status: AC
  Administered 2021-07-31: 15 mL via OROMUCOSAL

## 2021-07-31 MED ORDER — PANTOPRAZOLE SODIUM 40 MG PO TBEC
40.0000 mg | DELAYED_RELEASE_TABLET | Freq: Every day | ORAL | Status: DC
  Administered 2021-08-02 – 2021-08-04 (×3): 40 mg via ORAL
  Filled 2021-07-31 (×3): qty 1

## 2021-07-31 MED ORDER — HEPARIN SODIUM (PORCINE) 1000 UNIT/ML IJ SOLN
INTRAMUSCULAR | Status: DC | PRN
Start: 2021-07-31 — End: 2021-07-31
  Administered 2021-07-31: 27000 [IU] via INTRAVENOUS
  Administered 2021-07-31: 5000 [IU] via INTRAVENOUS

## 2021-07-31 MED ORDER — NITROGLYCERIN IN D5W 200-5 MCG/ML-% IV SOLN: 0.0000 ug/min | INTRAVENOUS | Status: DC

## 2021-07-31 MED ORDER — ALBUMIN HUMAN 5 % IV SOLN
250.0000 mL | INTRAVENOUS | Status: AC | PRN
Start: 2021-07-31 — End: 2021-08-01
  Administered 2021-07-31 (×2): 12.5 g via INTRAVENOUS
  Filled 2021-07-31: qty 250

## 2021-07-31 MED ORDER — ACETAMINOPHEN 160 MG/5ML PO SOLN: 650.0000 mg | Freq: Once | ORAL | Status: AC

## 2021-07-31 MED ORDER — PLASMA-LYTE A IV SOLN
INTRAVENOUS | Status: DC | PRN
  Administered 2021-07-31: 1000 mL via INTRAVASCULAR

## 2021-07-31 MED ORDER — MAGNESIUM SULFATE 4 GM/100ML IV SOLN
4.0000 g | Freq: Once | INTRAVENOUS | Status: AC
  Administered 2021-07-31: 4 g via INTRAVENOUS
  Filled 2021-07-31: qty 100

## 2021-07-31 MED ORDER — SODIUM CHLORIDE 0.9% FLUSH: 10.0000 mL | INTRAVENOUS | Status: DC | PRN

## 2021-07-31 MED ORDER — DEXMEDETOMIDINE HCL IN NACL 400 MCG/100ML IV SOLN
0.0000 ug/kg/h | INTRAVENOUS | Status: DC
  Administered 2021-07-31: 0.5 ug/kg/h via INTRAVENOUS
  Filled 2021-07-31: qty 100

## 2021-07-31 MED ORDER — PROTAMINE SULFATE 10 MG/ML IV SOLN
INTRAVENOUS | Status: DC | PRN
  Administered 2021-07-31: 320 mg via INTRAVENOUS

## 2021-07-31 MED ORDER — INSULIN REGULAR(HUMAN) IN NACL 100-0.9 UT/100ML-% IV SOLN: INTRAVENOUS | Status: DC

## 2021-07-31 MED ORDER — CHLORHEXIDINE GLUCONATE CLOTH 2 % EX PADS
6.0000 | MEDICATED_PAD | Freq: Every day | CUTANEOUS | Status: DC
  Administered 2021-07-31 – 2021-08-02 (×3): 6 via TOPICAL

## 2021-07-31 MED ORDER — FENTANYL CITRATE (PF) 250 MCG/5ML IJ SOLN
INTRAMUSCULAR | Status: AC
  Filled 2021-07-31: qty 5

## 2021-07-31 MED ORDER — POVIDONE-IODINE 7.5 % EX SOLN
CUTANEOUS | Status: DC | PRN
  Administered 2021-07-31: 1 via TOPICAL

## 2021-07-31 MED ORDER — CHLORHEXIDINE GLUCONATE 0.12 % MT SOLN: 15.0000 mL | Freq: Once | OROMUCOSAL | Status: DC

## 2021-07-31 MED ORDER — METOPROLOL TARTRATE 5 MG/5ML IV SOLN: 2.5000 mg | INTRAVENOUS | Status: DC | PRN

## 2021-07-31 MED ORDER — ACETAMINOPHEN 160 MG/5ML PO SOLN: 1000.0000 mg | Freq: Four times a day (QID) | ORAL | Status: DC

## 2021-07-31 MED ORDER — SODIUM CHLORIDE 0.9% FLUSH: 3.0000 mL | INTRAVENOUS | Status: DC | PRN

## 2021-07-31 MED ORDER — METOPROLOL TARTRATE 12.5 MG HALF TABLET
12.5000 mg | ORAL_TABLET | Freq: Two times a day (BID) | ORAL | Status: DC
  Administered 2021-08-01 – 2021-08-02 (×3): 12.5 mg via ORAL
  Filled 2021-07-31 (×3): qty 1

## 2021-07-31 SURGICAL SUPPLY — 99 items
ADAPTER CARDIO PERF ANTE/RETRO (ADAPTER) ×5 IMPLANT
BAG COUNTER SPONGE SURGICOUNT (BAG) ×10 IMPLANT
BAG DECANTER FOR FLEXI CONT (MISCELLANEOUS) ×5 IMPLANT
BLADE CLIPPER SURG (BLADE) ×5 IMPLANT
BLADE STERNUM SYSTEM 6 (BLADE) ×5 IMPLANT
BNDG ELASTIC 4X5.8 VLCR STR LF (GAUZE/BANDAGES/DRESSINGS) ×5 IMPLANT
BNDG ELASTIC 6X5.8 VLCR STR LF (GAUZE/BANDAGES/DRESSINGS) ×5 IMPLANT
BNDG GAUZE ELAST 4 BULKY (GAUZE/BANDAGES/DRESSINGS) ×5 IMPLANT
CANISTER SUCT 3000ML PPV (MISCELLANEOUS) ×5 IMPLANT
CANNULA EZ GLIDE AORTIC 21FR (CANNULA) ×5 IMPLANT
CANNULA GUNDRY RCSP 15FR (MISCELLANEOUS) ×5 IMPLANT
CATH CPB KIT HENDRICKSON (MISCELLANEOUS) ×5 IMPLANT
CATH ROBINSON RED A/P 18FR (CATHETERS) ×5 IMPLANT
CATH THORACIC 36FR (CATHETERS) ×5 IMPLANT
CATH THORACIC 36FR RT ANG (CATHETERS) ×5 IMPLANT
CLIP FOGARTY SPRING 6M (CLIP) ×5 IMPLANT
CLIP TI WIDE RED SMALL 24 (CLIP) ×10 IMPLANT
CLIP VESOCCLUDE MED 24/CT (CLIP) IMPLANT
CLIP VESOCCLUDE SM WIDE 24/CT (CLIP) IMPLANT
CNTNR URN SCR LID CUP LEK RST (MISCELLANEOUS) ×4 IMPLANT
CONT SPEC 4OZ STRL OR WHT (MISCELLANEOUS) ×5
CONTAINER PROTECT SURGISLUSH (MISCELLANEOUS) ×10 IMPLANT
COVER SURGICAL LIGHT HANDLE (MISCELLANEOUS) ×5 IMPLANT
DERMABOND ADVANCED (GAUZE/BANDAGES/DRESSINGS) ×1
DERMABOND ADVANCED .7 DNX12 (GAUZE/BANDAGES/DRESSINGS) ×4 IMPLANT
DRAPE CARDIOVASCULAR INCISE (DRAPES) ×5
DRAPE SRG 135X102X78XABS (DRAPES) ×4 IMPLANT
DRAPE WARM FLUID 44X44 (DRAPES) ×5 IMPLANT
DRSG COVADERM 4X14 (GAUZE/BANDAGES/DRESSINGS) ×5 IMPLANT
ELECT REM PT RETURN 9FT ADLT (ELECTROSURGICAL) ×10
ELECTRODE REM PT RTRN 9FT ADLT (ELECTROSURGICAL) ×8 IMPLANT
FELT TEFLON 1X6 (MISCELLANEOUS) ×5 IMPLANT
GAUZE 4X4 16PLY ~~LOC~~+RFID DBL (SPONGE) ×5 IMPLANT
GAUZE SPONGE 4X4 12PLY STRL (GAUZE/BANDAGES/DRESSINGS) ×10 IMPLANT
GAUZE SPONGE 4X4 12PLY STRL LF (GAUZE/BANDAGES/DRESSINGS) ×10 IMPLANT
GLOVE SURG MICRO LTX SZ6 (GLOVE) ×10 IMPLANT
GLOVE SURG MICRO LTX SZ6.5 (GLOVE) ×15 IMPLANT
GLOVE SURG SIGNA 7.5 PF LTX (GLOVE) ×15 IMPLANT
GLOVE SURG UNDER POLY LF SZ6.5 (GLOVE) ×30 IMPLANT
GLOVE SURG UNDER POLY LF SZ7.5 (GLOVE) ×5 IMPLANT
GOWN STRL REUS W/ TWL LRG LVL3 (GOWN DISPOSABLE) ×24 IMPLANT
GOWN STRL REUS W/ TWL XL LVL3 (GOWN DISPOSABLE) ×8 IMPLANT
GOWN STRL REUS W/TWL LRG LVL3 (GOWN DISPOSABLE) ×30
GOWN STRL REUS W/TWL XL LVL3 (GOWN DISPOSABLE) ×10
HEMOSTAT POWDER SURGIFOAM 1G (HEMOSTASIS) ×15 IMPLANT
HEMOSTAT SURGICEL 2X14 (HEMOSTASIS) ×5 IMPLANT
INSERT FOGARTY XLG (MISCELLANEOUS) IMPLANT
KIT BASIN OR (CUSTOM PROCEDURE TRAY) ×5 IMPLANT
KIT SUCTION CATH 14FR (SUCTIONS) ×10 IMPLANT
KIT TURNOVER KIT B (KITS) ×5 IMPLANT
KIT VASOVIEW HEMOPRO 2 VH 4000 (KITS) ×5 IMPLANT
MARKER GRAFT CORONARY BYPASS (MISCELLANEOUS) ×15 IMPLANT
NS IRRIG 1000ML POUR BTL (IV SOLUTION) ×25 IMPLANT
PACK E OPEN HEART (SUTURE) ×5 IMPLANT
PACK OPEN HEART (CUSTOM PROCEDURE TRAY) ×5 IMPLANT
PAD ARMBOARD 7.5X6 YLW CONV (MISCELLANEOUS) ×10 IMPLANT
PAD ELECT DEFIB RADIOL ZOLL (MISCELLANEOUS) ×5 IMPLANT
PENCIL BUTTON HOLSTER BLD 10FT (ELECTRODE) ×5 IMPLANT
POSITIONER HEAD DONUT 9IN (MISCELLANEOUS) ×5 IMPLANT
PUNCH AORTIC ROTATE 4.0MM (MISCELLANEOUS) ×5 IMPLANT
PUNCH AORTIC ROTATE 4.5MM 8IN (MISCELLANEOUS) IMPLANT
PUNCH AORTIC ROTATE 5MM 8IN (MISCELLANEOUS) IMPLANT
SET MPS 3-ND DEL (MISCELLANEOUS) ×5 IMPLANT
SOL ANTI FOG 6CC (MISCELLANEOUS) ×4 IMPLANT
SOLUTION ANTI FOG 6CC (MISCELLANEOUS) ×1
SPONGE T-LAP 18X18 ~~LOC~~+RFID (SPONGE) ×5 IMPLANT
SPONGE T-LAP 4X18 ~~LOC~~+RFID (SPONGE) ×5 IMPLANT
SUPPORT HEART JANKE-BARRON (MISCELLANEOUS) ×5 IMPLANT
SUT BONE WAX W31G (SUTURE) ×5 IMPLANT
SUT MNCRL AB 4-0 PS2 18 (SUTURE) IMPLANT
SUT PROLENE 3 0 SH DA (SUTURE) ×5 IMPLANT
SUT PROLENE 4 0 RB 1 (SUTURE) ×20
SUT PROLENE 4 0 SH DA (SUTURE) IMPLANT
SUT PROLENE 4-0 RB1 .5 CRCL 36 (SUTURE) ×16 IMPLANT
SUT PROLENE 5 0 C 1 36 (SUTURE) ×10 IMPLANT
SUT PROLENE 6 0 C 1 30 (SUTURE) ×20 IMPLANT
SUT PROLENE 7 0 BV1 MDA (SUTURE) ×15 IMPLANT
SUT PROLENE 8 0 BV175 6 (SUTURE) ×20 IMPLANT
SUT STEEL 6MS V (SUTURE) ×5 IMPLANT
SUT STEEL STERNAL CCS#1 18IN (SUTURE) IMPLANT
SUT STEEL SZ 6 DBL 3X14 BALL (SUTURE) ×5 IMPLANT
SUT VIC AB 1 CTX 36 (SUTURE) ×10
SUT VIC AB 1 CTX36XBRD ANBCTR (SUTURE) ×8 IMPLANT
SUT VIC AB 2-0 CT1 27 (SUTURE) ×5
SUT VIC AB 2-0 CT1 TAPERPNT 27 (SUTURE) ×4 IMPLANT
SUT VIC AB 2-0 CTX 27 (SUTURE) IMPLANT
SUT VIC AB 3-0 SH 27 (SUTURE)
SUT VIC AB 3-0 SH 27X BRD (SUTURE) IMPLANT
SUT VIC AB 3-0 X1 27 (SUTURE) ×5 IMPLANT
SUT VICRYL 4-0 PS2 18IN ABS (SUTURE) IMPLANT
SYSTEM SAHARA CHEST DRAIN ATS (WOUND CARE) ×5 IMPLANT
TAPE CLOTH SURG 4X10 WHT LF (GAUZE/BANDAGES/DRESSINGS) ×10 IMPLANT
TAPE PAPER 2X10 WHT MICROPORE (GAUZE/BANDAGES/DRESSINGS) ×5 IMPLANT
TOWEL GREEN STERILE (TOWEL DISPOSABLE) ×5 IMPLANT
TOWEL GREEN STERILE FF (TOWEL DISPOSABLE) ×5 IMPLANT
TRAY FOLEY SLVR 16FR TEMP STAT (SET/KITS/TRAYS/PACK) ×5 IMPLANT
TUBING LAP HI FLOW INSUFFLATIO (TUBING) ×5 IMPLANT
UNDERPAD 30X36 HEAVY ABSORB (UNDERPADS AND DIAPERS) ×5 IMPLANT
WATER STERILE IRR 1000ML POUR (IV SOLUTION) ×10 IMPLANT

## 2021-07-31 NOTE — Progress Notes (Signed)
Patient underwent CABG.  CVTS is now primary.  TRH signed off but available when needed.

## 2021-07-31 NOTE — Progress Notes (Signed)
Patient ID: Alexander Quinn, male   DOB: 24-Jun-1956, 65 y.o.   MRN: [id number]  TCTS Evening Rounds:   Hemodynamically stable, atrial paced 80 CI = 2.0  Extubated  Urine output good  CT output low  CBC    Component Value Date/Time   WBC 15.8 (H) 07/31/2021 1430   RBC 4.28 07/31/2021 1430   HGB 11.6 (L) 07/31/2021 1852   HGB 14.8 03/24/2021 0946   HCT 34.0 (L) 07/31/2021 1852   HCT 43.3 03/24/2021 0946   PLT 224 07/31/2021 1430   PLT 331 03/24/2021 0946   MCV 83.9 07/31/2021 1430   MCV 81 03/24/2021 0946   MCH 28.3 07/31/2021 1430   MCHC 33.7 07/31/2021 1430   RDW 13.4 07/31/2021 1430   RDW 13.7 03/24/2021 0946   LYMPHSABS 1.8 03/24/2021 0946   EOSABS 0.2 03/24/2021 0946   BASOSABS 0.0 03/24/2021 0946     BMET    Component Value Date/Time   NA 139 07/31/2021 1852   NA 141 03/24/2021 0946   K 4.7 07/31/2021 1852   CL 106 07/31/2021 1315   CO2 24 07/31/2021 0352   GLUCOSE 141 (H) 07/31/2021 1315   BUN 11 07/31/2021 1315   BUN 15 03/24/2021 0946   CREATININE 0.80 07/31/2021 1315   CALCIUM 9.2 07/31/2021 0352   GFRNONAA >60 07/31/2021 0352   GFRAA 79 05/06/2020 0855     A/P:  Stable postop course. Continue current plans

## 2021-07-31 NOTE — Hospital Course (Addendum)
Referring:  Jettie Booze, MD Primary Care: Eden Lathe, MD Primary Cardiologist:None    History of Present Illness:AT TIME OF CONSULTATION   Mr. 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  He has past medical history significant for hypertension, dyslipidemia, obesity, and suspected coronary artery disease.  He has a 12-pack-year smoking history but has not smoked since 1981.  He was apparently having some cardiac symptoms last year and underwent a Cardiolite stress test by [facility] health care.  This demonstrated mild to moderate lateral wall ischemia.  He did not follow through with any further work-up preferring to get a second opinion but has not seen any other healthcare provider for management of his suspected coronary disease in the interim.  He presented to the emergency room at [facility] by way of EMS on 07/26/2021 after having a syncopal episode while playing basketball at the [facility].  This episode apparently lasted less than a minute and was accompanied by shortness of breath.  In the emergency room, he was noted to have a blood pressure of 190/94.  EKG showed normal sinus rhythm with no acute ischemic changes.  Chest x-ray was unremarkable.  Initial troponin was 44 and serial levels rose slightly to 55 in 66.  Given his previous positive Cardiolite stress test, decision was made to admit to the hospital for further evaluation and management.  He was seen by cardiology service.  He was started on aspirin, heparin infusion, and rosuvastatin.  He was also started on Avapro for his hypertension with appropriate response.  He had no further chest pain or shortness of breath after admission to the hospital. An echocardiogram was obtained that showed an ejection fraction of 60 to 65%.  There is no regional wall abnormalities.  RV function was normal.  There was no significant valvular abnormality. Left heart catheterization  was also recommended in carried out earlier today.  This demonstrates severe three-vessel coronary artery disease.  Please see the procedure note below. We have been asked to evaluate [name] for consideration of coronary bypass grafting.     Mr. Baby has a long history of multiple episodes of nephrolithiasis.  He was seen by his urologist this past Friday and had an x-ray that showed multiple small stones in both kidneys.  He has also had elevation in his PSA multiple occasions and has had prostate biopsy x2 both of which were negative for malignancy.   He is right-hand dominant. He denies any symptoms of vascular disease or varicosities.   The patient and all pertinent studies were evaluated by Dr. Roxan Hockey who recommended proceeding with CABG.  Hospital course:  Patient was medically stabilized and felt to be an adequate candidate to proceed with surgery.  07/31/2021 he was taken the operating room at which time he underwent CABG x5.  Patient tolerated the procedure well and was taken to the surgical intensive care unit in stable condition.  Postoperative hospital course: Patient was extubated without difficulty he evening of surgery. He was initially A paced. Gordy Councilman, a line, foley and chest tubes were removed early in his post operative course. He was started on Lopressor. He was volume overloaded and diuresed accordingly. He was started on Lovenox for DVT prophylaxis. He had expected post op blood loss anemia. He did not require a transfusion. His last H and H was stable at 11.5 and 34.8. Per cardiology, since he had syncope with heart  rates in the 40s to 50s prior to bypass not on any AV nodal blocking agents prior to admit, Lopressor was discontinued. He later went into a fib. He was put on an Amiodarone drip. He converted to sinus rhythm. He was transitioned to oral Amiodarone and restarted on Lopressor. He was felt surgically stable for transfer from the ICU to the floor for further  convalescence on 09/25.  The patient remained clinically stable.  His pacing wires were removed without difficulty.  He is ambulating independently.  He requested a rolling walker for discharge which has been arranged.  His surgical incisions are healing without evidence of infection.  He is medically stable for discharge home today.

## 2021-07-31 NOTE — Brief Op Note (Signed)
07/26/2021 - 07/31/2021  6:03 PM  PATIENT:  Alexander Quinn  65 y.o. male  PRE-OPERATIVE DIAGNOSIS:  CAD  POST-OPERATIVE DIAGNOSIS:  CAD  PROCEDURE:  Procedure(s): CORONARY ARTERY BYPASS GRAFTING (CABG)x 5 USING LIMA  AND RIGHT GSV. LIMA TO LAD, SVG TO OM, SVG TO DIAG., SVG TO PD-PL SEQUENTIALLY (N/A) TRANSESOPHAGEAL ECHOCARDIOGRAM (TEE) (N/A) APPLICATION OF CELL SAVER ENDOVEIN HARVEST OF GREATER SAPHENOUS VEIN (Right) ENDARTERECTOMY CORONARY ARTERY SEQ LIMA-LAD-DIAG SEQ SVG-PD-PL SVG-OM EVH - 50 MINUTES  SURGEON:  Surgeon(s) and Role:    * Melrose Nakayama, MD - Primary  PHYSICIAN ASSISTANT: WAYNE GOLD PA-C  ASSISTANTS: STAFF   ANESTHESIA:   general  EBL:  651 mL   BLOOD ADMINISTERED:none  DRAINS:  LEFT PLEURAL AND MEDIASTINAL CHEST TUBES    LOCAL MEDICATIONS USED:  NONE  SPECIMEN:  No Specimen  DISPOSITION OF SPECIMEN:  N/A  COUNTS:  YES  TOURNIQUET:  * No tourniquets in log *  DICTATION: .Other Dictation: Dictation Number [id number]  PLAN OF CARE: Admit to inpatient   PATIENT DISPOSITION:  ICU - intubated and hemodynamically stable.   Delay start of Pharmacological VTE agent (>24hrs) due to surgical blood loss or risk of bleeding: yes  COMPLICATIONS: NO KNOWN

## 2021-07-31 NOTE — Transfer of Care (Signed)
Immediate Anesthesia Transfer of Care Note  Patient: Alexander Quinn  Procedure(s) Performed: CORONARY ARTERY BYPASS GRAFTING (CABG)x 5 USING LIMA  AND RIGHT GSV. LIMA TO LAD, SVG TO OM, SVG TO DIAG., SVG TO PD-PL SEQUENTIALLY (Chest) TRANSESOPHAGEAL ECHOCARDIOGRAM (TEE) APPLICATION OF CELL SAVER ENDOVEIN HARVEST OF GREATER SAPHENOUS VEIN (Right) ENDARTERECTOMY CORONARY ARTERY  Patient Location: SICU  Anesthesia Type:General  Level of Consciousness: sedated and Patient remains intubated per anesthesia plan  Airway & Oxygen Therapy: Patient remains intubated per anesthesia plan and Patient placed on Ventilator (see vital sign flow sheet for setting)  Post-op Assessment: Report given to RN and Post -op Vital signs reviewed and stable  Post vital signs: Reviewed and stable  Last Vitals:  Vitals Value Taken Time  BP 115/67 07/31/21 1420  Temp 36.1 C 07/31/21 1429  Pulse 80 07/31/21 1429  Resp 12 07/31/21 1429  SpO2 96 % 07/31/21 1429  Vitals shown include unvalidated device data.  Last Pain:  Vitals:   07/31/21 0428  TempSrc: Oral  PainSc:          Complications: No notable events documented.

## 2021-07-31 NOTE — Progress Notes (Signed)
Echocardiogram Echocardiogram Transesophageal has been performed.  Oneal Deputy Chaneka Trefz RDCS 07/31/2021, 8:54 AM

## 2021-07-31 NOTE — Anesthesia Procedure Notes (Signed)
Central Venous Catheter Insertion Performed by: Audry Pili, MD, anesthesiologist Start/End9/22/2022 7:23 AM, 07/31/2021 7:33 AM Preanesthetic checklist: patient identified, IV checked, risks and benefits discussed, surgical consent, monitors and equipment checked, pre-op evaluation, timeout performed and anesthesia consent Position: Trendelenburg Lidocaine 1% used for infiltration and patient sedated Hand hygiene performed , maximum sterile barriers used  and Seldinger technique used Catheter size: 8.5 Fr Central line was placed.Sheath introducer Procedure performed using ultrasound guided technique. Ultrasound Notes:anatomy identified, needle tip was noted to be adjacent to the nerve/plexus identified, no ultrasound evidence of intravascular and/or intraneural injection and image(s) printed for medical record Attempts: 1 Following insertion, line sutured, dressing applied and Biopatch. Post procedure assessment: blood return through all ports, free fluid flow and no air  Patient tolerated the procedure well with no immediate complications.

## 2021-07-31 NOTE — Discharge Summary (Addendum)
[address]       [place] [address]             [phone]    Physician Discharge Summary  Patient ID: Alexander Quinn MRN: [id number] DOB/AGE: 05/28/1956 65 y.o.  Admit date: 07/26/2021 Discharge date: 08/04/2021  Admission Diagnoses:  Patient Active Problem List   Diagnosis Date Noted   S/P CABG x 5 07/31/2021   Syncope 07/26/2021   Elevated troponin 07/26/2021   HTN (hypertension) 05/09/2020   Hyperlipidemia 05/09/2020     Discharge Diagnoses:  Patient Active Problem List   Diagnosis Date Noted   S/P CABG x 5 07/31/2021   Syncope 07/26/2021   Elevated troponin 07/26/2021   HTN (hypertension) 05/09/2020   Hyperlipidemia 05/09/2020    Discharged Condition: stable  Referring:  Jettie Booze, MD Primary Care: Eden Lathe, MD Primary Cardiologist:None    History of Present Illness:AT TIME OF CONSULTATION   Alexander Quinn is a 65 year old male who lives in [place] and 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  He has past medical history significant for hypertension, dyslipidemia, obesity, and suspected coronary artery disease.  He has a 12-pack-year smoking history but has not smoked since 1981.  He was apparently having some cardiac symptoms last year and underwent a Cardiolite stress test by [facility] health care.  This demonstrated mild to moderate lateral wall ischemia.  He did not follow through with any further work-up preferring to get a second opinion but has not seen any other healthcare provider for management of his suspected coronary disease in the interim.  He presented to the emergency room at [facility] by way of EMS on 07/26/2021 after having a syncopal episode while playing basketball at the [facility].  This episode apparently lasted less than a minute and was accompanied by shortness of breath.  In the emergency room, he was noted to have a blood pressure of 190/94.  EKG showed normal sinus rhythm with no acute  ischemic changes.  Chest x-ray was unremarkable.  Initial troponin was 44 and serial levels rose slightly to 55 in 66.  Given his previous positive Cardiolite stress test, decision was made to admit to the hospital for further evaluation and management.  He was seen by cardiology service.  He was started on aspirin, heparin infusion, and rosuvastatin.  He was also started on Avapro for his hypertension with appropriate response.  He had no further chest pain or shortness of breath after admission to the hospital. An echocardiogram was obtained that showed an ejection fraction of 60 to 65%.  There is no regional wall abnormalities.  RV function was normal.  There was no significant valvular abnormality. Left heart catheterization was also recommended in carried out earlier today.  This demonstrates severe three-vessel coronary artery disease.  Please see the procedure note below. We have been asked to evaluate Alexander Quinn for consideration of coronary bypass grafting.     Alexander Quinn has a long history of multiple episodes of nephrolithiasis.  He was seen by his urologist this past Friday and had an x-ray that showed multiple small stones in both kidneys.  He has also had elevation in his PSA multiple occasions and has had prostate biopsy x2 both of which were negative for malignancy.   He is right-hand dominant. He denies any symptoms of vascular disease or varicosities.   The patient and all pertinent studies were evaluated by Dr. Roxan Hockey who recommended proceeding  with CABG.  Hospital course:  Patient was medically stabilized and felt to be an adequate candidate to proceed with surgery.  07/31/2021 he was taken the operating room at which time he underwent CABG x5.  Patient tolerated the procedure well and was taken to the surgical intensive care unit in stable condition.  Postoperative hospital course: Patient was extubated without difficulty he evening of surgery. He was initially A paced. Gordy Councilman, a line, foley and chest tubes were removed early in his post operative course. He was started on Lopressor. He was volume overloaded and diuresed accordingly. He was started on Lovenox for DVT prophylaxis. He had expected post op blood loss anemia. He did not require a transfusion. His last H and H was stable at 11.5 and 34.8. Per cardiology, since he had syncope with heart rates in the 40s to 50s prior to bypass not on any AV nodal blocking agents prior to admit, Lopressor was discontinued. He later went into a fib. He was put on an Amiodarone drip. He converted to sinus rhythm. He was transitioned to oral Amiodarone and restarted on Lopressor. He was felt surgically stable for transfer from the ICU to the floor for further convalescence on 09/25.  The patient remained clinically stable.  His pacing wires were removed without difficulty.  He is ambulating independently.  He requested a rolling walker for discharge which has been arranged.  His surgical incisions are healing without evidence of infection.  He is medically stable for discharge home today.  Consults: None  Significant Diagnostic Studies: angiography:     Mid LM lesion is 30% stenosed.   RPDA lesion is 75% stenosed.   RPAV lesion is 80% stenosed.   Prox Cx to Mid Cx lesion is 90% stenosed.   Mid LAD-2 lesion is 99% stenosed.   Mid LAD-1 lesion is 50% stenosed.   Dist RCA lesion is 50% stenosed.   The left ventricular systolic function is normal.   LV end diastolic pressure is normal.   The left ventricular ejection fraction is 55-65% by visual estimate.   There is no aortic valve stenosis.   Severe three vessel CAD. Mid LAD subtotal occlusion at a large diagonal branch.  Proximal to mid circumflex lesion and distal RCA and branch vessel disease.       Treatments:  Median sternotomy, extracorporeal circulation, coronary artery bypass grafting x5 (sequential left internal mammary artery to diagonal and distal LAD, saphenous  vein graft to obtuse marginal, sequential saphenous vein graft to posterior  descending and posterolateral), posterior descending coronary endarterectomy.  Endoscopic vein harvest, right leg by Dr. Roxan Hockey on 07/31/2021.  Discharge Exam: Blood pressure (!) 105/55, pulse 86, temperature 98.5 F (36.9 C), temperature source Oral, resp. rate 18, height 5\' 9"  (1.753 m), weight 93.4 kg, SpO2 94 %.  General appearance: alert, cooperative, and no distress Heart: regular rate and rhythm Lungs: clear to auscultation bilaterally Abdomen: soft, non-tender; bowel sounds normal; no masses,  no organomegaly Extremities: edema trace Wound: clean and dry  Discharge Medications:  The patient has been discharged on:   1.Beta Blocker:  Yes [  x ]                              No   [   ]  If No, reason:  2.Ace Inhibitor/ARB: Yes [   ]                                     No  [ x   ]                                     If No, reason: hypotensive  3.Statin:   Yes [  x ]                  No  [   ]                  If No, reason:  4.Ecasa:  Yes  [  x ]                  No   [   ]                  If No, reason:  Patient had ACS upon admission:YES  Plavix/P2Y12 inhibitor: Yes [  x ]                                      No  [   ]   Discharge Instructions     Amb Referral to Cardiac Rehabilitation   Complete by: As directed    Will send Cardiac Rehab Phase 2 referral to [place]   Diagnosis: CABG   CABG X ___: 5   After initial evaluation and assessments completed: Virtual Based Care may be provided alone or in conjunction with Phase 2 Cardiac Rehab based on patient barriers.: Yes      Allergies as of 08/04/2021       Reactions   Simvastatin-high Dose Other (See Comments)   Memory problems        Medication List     STOP taking these medications    telmisartan 40 MG tablet Commonly known as: MICARDIS       TAKE these medications     acetaminophen 325 MG tablet Commonly known as: Tylenol Take 2 tablets (650 mg total) by mouth every 6 (six) hours as needed for mild pain, headache or fever.   albuterol 108 (90 Base) MCG/ACT inhaler Commonly known as: VENTOLIN HFA Inhale 1-2 puffs into the lungs See admin instructions. Inhale 1-2 puffs into the lungs every four to six hours as needed for shortness of breath or wheezing   amiodarone 200 MG tablet Commonly known as: PACERONE Take 2 tablets (400 mg total) by mouth 2 (two) times daily. X 7 days, then decrease to 200 mg by mouth twice a day x 7 days, then decrease to 200 mg daily   aspirin EC 81 MG tablet Take 1 tablet (81 mg total) by mouth daily. Swallow whole. What changed:  medication strength how much to take when to take this reasons to take this additional instructions   CINNAMON PO Take 1,000 mg by mouth daily.   clopidogrel 75 MG tablet Commonly known as: PLAVIX Take 1 tablet (75 mg total) by mouth daily. Start taking on: August 05, 2021   Coenzyme Q10 100 MG capsule Take 100 mg by mouth daily.   cyanocobalamin 1000 MCG tablet Take by mouth.  magnesium 30 MG tablet Take 30 mg by mouth daily.   metoprolol tartrate 25 MG tablet Commonly known as: LOPRESSOR Take 1 tablet (25 mg total) by mouth 2 (two) times daily.   POTASSIUM PO Take 1 tablet by mouth daily.   pyridoxine 100 MG tablet Commonly known as: B-6 Take 100 mg by mouth daily.   rosuvastatin 20 MG tablet Commonly known as: CRESTOR Take 1 tablet (20 mg total) by mouth daily. Start taking on: August 05, 2021   tamsulosin 0.4 MG Caps capsule Commonly known as: FLOMAX Take 1 capsule (0.4 mg total) by mouth daily after supper.   traMADol 50 MG tablet Commonly known as: ULTRAM Take 1 tablet (50 mg total) by mouth every 6 (six) hours as needed for moderate pain.   Vitamin D-3 25 MCG (1000 UT) Caps Take 1,000 Units by mouth daily.   ZINC SULFATE PO Take 1 tablet by mouth  daily.               Durable Medical Equipment  (From admission, onward)           Start     Ordered   08/04/21 0759  For home use only DME Walker rolling  Once       Question Answer Comment  Walker: With 5 Inch Wheels   Patient needs a walker to treat with the following condition S/P CABG (coronary artery bypass graft)      08/04/21 0758            Follow-up Information     Melrose Nakayama, MD Follow up.   Specialty: Cardiothoracic Surgery Why: Please see discharge paperwork for follow-up appointment with your surgeon.  On the date you see the surgeon obtain a chest x-ray at [facility] which is located on the first floor of the same office complex.  Please obtain this x-ray 1/2-hour prior to your surgeon appointment. Contact information: [address]  [place] [address] [phone]         Jettie Booze, MD Follow up on 08/20/2021.   Specialties: Cardiology, Radiology, Interventional Cardiology Why: Please arrive 15 minutes early for your 9:30am post-hospital cardiology appointment Contact information: [address] [place] [address] [phone]                 Signed:  Ellwood Handler, PA-C   08/04/2021, 12:39 PM

## 2021-07-31 NOTE — Procedures (Signed)
Extubation Procedure Note  Patient Details:   Name: Caelan T Coppolino DOB: 10/31/1956 MRN: [id number]   Airway Documentation:    Vent end date: 07/31/21 Vent end time: 1855   Evaluation  O2 sats: stable throughout Complications: No apparent complications Patient did tolerate procedure well. Bilateral Breath Sounds: Clear, Diminished   Patient extubated per protocol & placed on 4L [place]. Patient met wean parameters with NIF -25, VC 1400, & had positive cuff leak. Patient able to speak & has good strong cough. IS instructed by RN.  ,  David 07/31/2021, 6:58 PM  

## 2021-07-31 NOTE — Anesthesia Procedure Notes (Signed)
Procedure Name: Intubation Date/Time: 07/31/2021 8:17 AM Performed by: Gaylene Brooks, CRNA Pre-anesthesia Checklist: Patient identified, Emergency Drugs available, Suction available and Patient being monitored Patient Re-evaluated:Patient Re-evaluated prior to induction Oxygen Delivery Method: Circle System Utilized Preoxygenation: Pre-oxygenation with 100% oxygen Induction Type: IV induction Ventilation: Mask ventilation without difficulty Laryngoscope Size: Miller and 2 Grade View: Grade II Tube type: Oral Tube size: 8.0 mm Number of attempts: 1 Airway Equipment and Method: Stylet and Oral airway Placement Confirmation: ETT inserted through vocal cords under direct vision, positive ETCO2 and breath sounds checked- equal and bilateral Secured at: 22 cm Tube secured with: Tape Dental Injury: Teeth and Oropharynx as per pre-operative assessment

## 2021-07-31 NOTE — Anesthesia Procedure Notes (Signed)
Central Venous Catheter Insertion Performed by: Audry Pili, MD, anesthesiologist Start/End9/22/2022 7:31 AM, 07/31/2021 7:33 AM Patient location: Pre-op. Preanesthetic checklist: patient identified, IV checked, risks and benefits discussed, surgical consent, monitors and equipment checked, pre-op evaluation, timeout performed and anesthesia consent Position: Trendelenburg Hand hygiene performed  and maximum sterile barriers used  Total catheter length 10. PA cath was placed.Swan type:thermodilution PA Cath depth:48 Procedure performed without using ultrasound guided technique. Attempts: 1 Patient tolerated the procedure well with no immediate complications.

## 2021-07-31 NOTE — Anesthesia Procedure Notes (Signed)
Arterial Line Insertion Start/End9/22/2022 6:45 AM, 07/31/2021 7:00 AM Performed by: Gaylene Brooks, CRNA, CRNA  Patient location: Pre-op. Preanesthetic checklist: patient identified, IV checked, site marked, risks and benefits discussed, surgical consent, monitors and equipment checked, pre-op evaluation, timeout performed and anesthesia consent Lidocaine 1% used for infiltration Left, radial was placed Catheter size: 20 G Hand hygiene performed  and maximum sterile barriers used  Allen's test indicative of satisfactory collateral circulation Attempts: 1 Procedure performed without using ultrasound guided technique. Following insertion, dressing applied and Biopatch. Post procedure assessment: normal  Patient tolerated the procedure well with no immediate complications.

## 2021-07-31 NOTE — Op Note (Signed)
NAME: Alexander Quinn, Alexander Quinn. MEDICAL RECORD NO: [id number] ACCOUNT NO: [id number] DATE OF BIRTH: 04-29-1956 FACILITY: [facility] LOCATION: [facility] PHYSICIAN: Revonda Standard. Roxan Hockey, MD  Operative Report   DATE OF PROCEDURE: 07/31/2021  PREOPERATIVE DIAGNOSIS:  Three-vessel coronary artery disease.  POSTOPERATIVE DIAGNOSIS:  Three-vessel coronary artery disease.  PROCEDURE:  Median sternotomy, extracorporeal circulation,  Coronary artery bypass grafting x 5 ' Sequential left internal mammary artery to diagonal and distal LAD, Saphenous vein graft to obtuse marginal,  Sequential saphenous vein graft to posterior  descending and posterolateral Posterior descending coronary endarterectomy.   Endoscopic vein harvest, right leg.  SURGEON:  Revonda Standard. Roxan Hockey, MD   ASSISTANT:  Jadene Pierini, PA-C.  ANESTHESIA:  General.  FINDINGS:  Severe diffuse coronary artery disease.  Poor targets. NOT a candidate for redo bypass grafting.  Good quality conduits.  Transesophageal echocardiography showed preserved left ventricular function.  CLINICAL NOTE PROCEDURE:  Mr. Alexander Quinn is a 65 year old gentleman who presented with a syncopal episode.  He had a positive troponin.  He underwent cardiac catheterization, which revealed severe 3-vessel coronary artery disease.  Echocardiogram showed  preserved left ventricular function.  He was referred for coronary artery bypass grafting.  The indications, risks, benefits, and alternatives were discussed in detail with the patient.  He understood and accepted the risks and agreed to proceed.  DESCRIPTION OF PROCEDURE:  Mr. Alexander Quinn was brought to the preoperative holding area on 07/31/2021.  Anesthesia placed a Swan-Ganz catheter and an arterial blood pressure monitoring line.  He was taken to the operating room, anesthetized and intubated.  A  Foley catheter was placed.  Intravenous antibiotics were administered.  Transesophageal echocardiography was performed by Dr. Fransisco Beau.   Please see his separately dictated note for full details.  It showed preserved left ventricular function with no  significant valvular pathology.  The chest, abdomen and legs were prepped and draped in the usual sterile fashion.  A median sternotomy was performed and the left internal mammary artery was harvested using standard technique.  Simultaneously, an incision was made in the medial aspect of the right leg.  The greater saphenous vein was harvested from upper calf to groin  endoscopically.  The mammary artery was a good quality vessel.  The vein was relatively small caliber.  It was satisfactory for use as a bypass graft.  5000 units of heparin was administered during the vessel harvest.  The remainder of the full  Heparin dose was given prior to opening the pericardium.  After harvesting the conduits the sternal retractor was placed and was opened over time.  The pericardium was opened.  The ascending aorta was inspected.  There was no significant atherosclerotic disease.  After confirming adequate anticoagulation with  ACT measurement the aorta was cannulated via concentric 2-0 Ethibond pledgeted pursestring sutures.  A dual stage venous cannula was placed via a pursestring suture in the right atrial appendage.  Cardiopulmonary bypass was initiated.  Flows were  maintained per protocol.  The patient was cooled to 32 degrees Celsius.  The coronary arteries were inspected.  They were diffusely diseased.  The anastomotic sites were chosen.  The conduits were inspected and cut to length.  Decision was made to use  the mammary sequentially to the diagonal and the distal LAD as the diagonal was in communication with the more proximal mid LAD and the distal LAD was a very small diffusely diseased vessel.  A foam pad was placed in the pericardium to insulate the  heart.  A temperature probe was  placed in the myocardial septum and the cardioplegia cannula was placed in the ascending aorta.  The aorta  was cross clamped.  The left ventricle was emptied via the aortic root vent, cardiac arrest was achieved with a combination of cold antegrade and retrograde cardioplegia and topical iced saline.  Initially a liter of cardioplegia was  administered antegrade, but there was a slow arrest and slow septal cooling.  An additional liter of cardioplegia was administered retrograde and there was a complete diastolic arrest and septal cooling to 10 degrees Celsius.  A reversed saphenous vein graft was placed end-to-side to the obtuse marginal branch of the left circumflex.  This was the best of the target vessels.  It was fair quality.  It did accept a 1.5 mm probe.  An end-to-side anastomosis was performed with a  running 7-0 Prolene suture.  All anastomoses were probed proximally and distally to ensure patency prior to tying the suture.  Cardioplegia was administered down the vein graft and there was good flow and good hemostasis.  Next, a reversed saphenous vein graft was placed sequentially to the posterior descending and the posterolateral.  There was no good site for grafting of the posterior descending.  An attempt was made to open an area that appeared to be relatively free of  plaque.  The plaque in that area was much worse than expected and an endarterectomy had to be performed.  There was a good tapered endarterectomy specimen at both the proximal and distal end.  A side-to-side anastomosis then was performed with a running  7-0 Prolene suture.  The distal end was bevelled and anastomosed end-to-side to the posterolateral, which was a 1.5 mm fair quality target.  This was also done with a running 7-0 Prolene suture.  Cardioplegia was administered down the graft and there  was good flow and good hemostasis.  There was a flash of blood, both proximally and distally from the posterior descending anastomosis.  Additional cardioplegia was also administered via the retrograde cannula.  The left internal  mammary artery was brought through a window in the pericardium.  It was carefully measured and an arteriotomy was made at the site where it crossed over the diagonal branch.  A side-to-side anastomosis was performed with a running 8-0  Prolene suture.  A probe did pass retrograde into the mid LAD as well as distally in the diagonal.  There was diffuse disease in the diagonal vessel.  The distal end then was bevelled and anastomosed end-to-side to the distal LAD.  The distal LAD was a 1  mm poor quality target vessel, but a probe did pass to the apex.  This anastomosis was also done with 8-0 Prolene suture.  Additional cardioplegia was administered, the vein grafts were cut to length.  The proximal vein graft anastomoses were performed to 4.5 mm punch aortotomies with running 6-0 Prolene sutures.  At the completion of the final proximal anastomosis, the  patient was placed in Trendelenburg position.  Lidocaine was administered.  The aortic root was de-aired and the aortic crossclamp was removed.  Total crossclamp time was 105 minutes.  The patient required a single defibrillation with 10 joules and then  was in sinus rhythm thereafter.  While rewarming was completed all proximal and distal anastomoses were inspected for hemostasis.  Epicardial pacing wires were placed on the right ventricle and right atrium.  When the patient had rewarmed to a core temperature of 37 degrees Celsius he  was weaned from cardiopulmonary bypass  on the first attempt, he was atrially paced and on no inotropic support at the time of separation from bypass.  Total bypass time was 152 minutes.  The initial cardiac index was greater than 2 liters per minute per  meter squared and he remained hemodynamically stable throughout the post-bypass.  Post-bypass transesophageal echocardiography was unchanged from the prebypass study.  A test dose of protamine was administered and was well tolerated.  The atrial and aortic cannula were  removed.  The remainder of the protamine was administered without incident.  The chest was irrigated with warm saline.  Hemostasis was achieved.  The  left pleural and mediastinal chest tubes were placed through separate subcostal incisions.  The pericardium was not closed.  The sternum was closed with a combination of single and double heavy gauge stainless steel wires.  The pectoralis fascia,  subcutaneous tissue and skin were closed in standard fashion.  All sponge, needle and instrument counts were correct at the end of the procedure.  The patient was taken from the operating room to the surgical intensive care unit, intubated and in good  condition.   PUS D: 07/31/2021 6:12:21 pm T: 07/31/2021 9:23:00 pm  JOB: [id number]/ [id number]

## 2021-07-31 NOTE — Progress Notes (Signed)
Wedding ring from pt was given to wife Sharen. Pt's belongings were taken by the wife as well.

## 2021-07-31 NOTE — Anesthesia Postprocedure Evaluation (Signed)
Anesthesia Post Note  Patient: Alexander Quinn  Procedure(s) Performed: CORONARY ARTERY BYPASS GRAFTING (CABG)x 5 USING LIMA  AND RIGHT GSV. LIMA TO LAD, SVG TO OM, SVG TO DIAG., SVG TO PD-PL SEQUENTIALLY (Chest) TRANSESOPHAGEAL ECHOCARDIOGRAM (TEE) APPLICATION OF CELL SAVER ENDOVEIN HARVEST OF GREATER SAPHENOUS VEIN (Right) ENDARTERECTOMY CORONARY ARTERY     Patient location during evaluation: ICU Anesthesia Type: General Level of consciousness: sedated and patient remains intubated per anesthesia plan Pain management: pain level controlled Vital Signs Assessment: post-procedure vital signs reviewed and stable Respiratory status: patient remains intubated per anesthesia plan Cardiovascular status: stable Postop Assessment: no apparent nausea or vomiting Anesthetic complications: no   No notable events documented.  Last Vitals:  Vitals:   07/31/21 1515 07/31/21 1530  BP:    Pulse: 80 80  Resp: 12 13  Temp: (!) 35.9 C (!) 35.9 C  SpO2: 99% 99%    Last Pain:  Vitals:   07/31/21 0428  TempSrc: Oral  PainSc:                  Audry Pili

## 2021-07-31 NOTE — Interval H&P Note (Signed)
History and Physical Interval Note:  07/31/2021 8:37 AM  Alexander Quinn  has presented today for surgery, with the diagnosis of CAD.  The various methods of treatment have been discussed with the patient and family. After consideration of risks, benefits and other options for treatment, the patient has consented to  Procedure(s): CORONARY ARTERY BYPASS GRAFTING (CABG) (N/A) TRANSESOPHAGEAL ECHOCARDIOGRAM (TEE) (N/A) as a surgical intervention.  The patient's history has been reviewed, patient examined, no change in status, stable for surgery.  I have reviewed the patient's chart and labs.  Questions were answered to the patient's satisfaction.     Melrose Nakayama

## 2021-08-01 ENCOUNTER — Encounter (HOSPITAL_COMMUNITY): Payer: Self-pay | Admitting: Thoracic Surgery (Cardiothoracic Vascular Surgery)

## 2021-08-01 ENCOUNTER — Inpatient Hospital Stay (HOSPITAL_COMMUNITY): Payer: 59

## 2021-08-01 LAB — CBC
HCT: 33.7 % — ABNORMAL LOW (ref 39.0–52.0)
HCT: 36.5 % — ABNORMAL LOW (ref 39.0–52.0)
Hemoglobin: 11.3 g/dL — ABNORMAL LOW (ref 13.0–17.0)
Hemoglobin: 11.9 g/dL — ABNORMAL LOW (ref 13.0–17.0)
MCH: 28.3 pg (ref 26.0–34.0)
MCH: 27.7 pg (ref 26.0–34.0)
MCHC: 33.5 g/dL (ref 30.0–36.0)
MCHC: 32.6 g/dL (ref 30.0–36.0)
MCV: 84.5 fL (ref 80.0–100.0)
MCV: 85.1 fL (ref 80.0–100.0)
Platelets: 234 10*3/uL (ref 150–400)
Platelets: 266 10*3/uL (ref 150–400)
RBC: 3.99 MIL/uL — ABNORMAL LOW (ref 4.22–5.81)
RBC: 4.29 MIL/uL (ref 4.22–5.81)
RDW: 13.8 % (ref 11.5–15.5)
RDW: 14 % (ref 11.5–15.5)
WBC: 14.8 10*3/uL — ABNORMAL HIGH (ref 4.0–10.5)
WBC: 17.3 10*3/uL — ABNORMAL HIGH (ref 4.0–10.5)
nRBC: 0 % (ref 0.0–0.2)
nRBC: 0 % (ref 0.0–0.2)

## 2021-08-01 LAB — BASIC METABOLIC PANEL
Anion gap: 7 (ref 5–15)
Anion gap: 10 (ref 5–15)
BUN: 12 mg/dL (ref 8–23)
BUN: 17 mg/dL (ref 8–23)
CO2: 20 mmol/L — ABNORMAL LOW (ref 22–32)
CO2: 22 mmol/L (ref 22–32)
Calcium: 7.6 mg/dL — ABNORMAL LOW (ref 8.9–10.3)
Calcium: 8.4 mg/dL — ABNORMAL LOW (ref 8.9–10.3)
Chloride: 107 mmol/L (ref 98–111)
Chloride: 102 mmol/L (ref 98–111)
Creatinine, Ser: 1.01 mg/dL (ref 0.61–1.24)
Creatinine, Ser: 1.26 mg/dL — ABNORMAL HIGH (ref 0.61–1.24)
GFR, Estimated: >60 mL/min (ref >60)
GFR, Estimated: >60 mL/min (ref >60)
Glucose, Bld: 149 mg/dL — ABNORMAL HIGH (ref 70–99)
Glucose, Bld: 160 mg/dL — ABNORMAL HIGH (ref 70–99)
Potassium: 4.3 mmol/L (ref 3.5–5.1)
Potassium: 4.4 mmol/L (ref 3.5–5.1)
Sodium: 134 mmol/L — ABNORMAL LOW (ref 135–145)
Sodium: 134 mmol/L — ABNORMAL LOW (ref 135–145)

## 2021-08-01 LAB — MAGNESIUM
Magnesium: 2.8 mg/dL — ABNORMAL HIGH (ref 1.7–2.4)
Magnesium: 2.6 mg/dL — ABNORMAL HIGH (ref 1.7–2.4)

## 2021-08-01 LAB — GLUCOSE, CAPILLARY
Glucose-Capillary: 137 mg/dL — ABNORMAL HIGH (ref 70–99)
Glucose-Capillary: 139 mg/dL — ABNORMAL HIGH (ref 70–99)
Glucose-Capillary: 140 mg/dL — ABNORMAL HIGH (ref 70–99)
Glucose-Capillary: 157 mg/dL — ABNORMAL HIGH (ref 70–99)
Glucose-Capillary: 166 mg/dL — ABNORMAL HIGH (ref 70–99)

## 2021-08-01 LAB — SURGICAL PATHOLOGY

## 2021-08-01 MED ORDER — ENOXAPARIN SODIUM 40 MG/0.4ML IJ SOSY
40.0000 mg | PREFILLED_SYRINGE | Freq: Every day | INTRAMUSCULAR | Status: DC
  Administered 2021-08-01 – 2021-08-03 (×3): 40 mg via SUBCUTANEOUS
  Filled 2021-08-01 (×3): qty 0.4

## 2021-08-01 MED ORDER — MELATONIN 3 MG PO TABS
3.0000 mg | ORAL_TABLET | Freq: Every day | ORAL | Status: DC
  Administered 2021-08-01 – 2021-08-03 (×3): 3 mg via ORAL
  Filled 2021-08-01 (×3): qty 1

## 2021-08-01 MED ORDER — CLOPIDOGREL BISULFATE 75 MG PO TABS
75.0000 mg | ORAL_TABLET | Freq: Every day | ORAL | Status: DC
  Administered 2021-08-02 – 2021-08-04 (×3): 75 mg via ORAL
  Filled 2021-08-01 (×3): qty 1

## 2021-08-01 MED ORDER — INSULIN ASPART 100 UNIT/ML IJ SOLN: 0.0000 [IU] | Freq: Every day | INTRAMUSCULAR | Status: DC

## 2021-08-01 MED ORDER — INSULIN ASPART 100 UNIT/ML IJ SOLN
0.0000 [IU] | Freq: Three times a day (TID) | INTRAMUSCULAR | Status: DC
  Administered 2021-08-01: 3 [IU] via SUBCUTANEOUS
  Administered 2021-08-01 – 2021-08-03 (×5): 2 [IU] via SUBCUTANEOUS

## 2021-08-01 MED ORDER — ROSUVASTATIN CALCIUM 5 MG PO TABS
10.0000 mg | ORAL_TABLET | Freq: Every day | ORAL | Status: DC
  Administered 2021-08-01 – 2021-08-02 (×2): 10 mg via ORAL
  Filled 2021-08-01 (×2): qty 2

## 2021-08-01 MED ORDER — FUROSEMIDE 10 MG/ML IJ SOLN
40.0000 mg | Freq: Once | INTRAMUSCULAR | Status: AC
  Administered 2021-08-01: 40 mg via INTRAVENOUS
  Filled 2021-08-01: qty 4

## 2021-08-01 MED FILL — Heparin Sodium (Porcine) Inj 1000 Unit/ML: INTRAMUSCULAR | Qty: 30 | Status: AC

## 2021-08-01 MED FILL — Electrolyte-R (PH 7.4) Solution: INTRAVENOUS | Qty: 6000 | Status: AC

## 2021-08-01 MED FILL — Lidocaine HCl Local Soln Prefilled Syringe 100 MG/5ML (2%): INTRAMUSCULAR | Qty: 5 | Status: AC

## 2021-08-01 MED FILL — Sodium Chloride IV Soln 0.9%: INTRAVENOUS | Qty: 3000 | Status: AC

## 2021-08-01 MED FILL — Sodium Bicarbonate IV Soln 8.4%: INTRAVENOUS | Qty: 50 | Status: AC

## 2021-08-01 NOTE — Progress Notes (Signed)
1 Day Post-Op Procedure(s) (LRB): CORONARY ARTERY BYPASS GRAFTING (CABG)x 5 USING LIMA  AND RIGHT GSV. LIMA TO LAD, SVG TO OM, SVG TO DIAG., SVG TO PD-PL SEQUENTIALLY (N/A) TRANSESOPHAGEAL ECHOCARDIOGRAM (TEE) (N/A) APPLICATION OF CELL SAVER ENDOVEIN HARVEST OF GREATER SAPHENOUS VEIN (Right) ENDARTERECTOMY CORONARY ARTERY Subjective: Some incisional pain  Objective: Vital signs in last 24 hours: Temp:  [96.6 F (35.9 C)-99.3 F (37.4 C)] 98.4 F (36.9 C) (09/23 0600) Pulse Rate:  [77-95] 78 (09/23 0600) Cardiac Rhythm: Normal sinus rhythm (09/23 0400) Resp:  [11-31] 19 (09/23 0600) BP: (90-166)/(65-89) 128/66 (09/23 0600) SpO2:  [94 %-100 %] 95 % (09/23 0600) Arterial Line BP: (95-151)/(50-75) 145/53 (09/23 0600) FiO2 (%):  [40 %-50 %] 40 % (09/22 1830) Weight:  [96.3 kg] 96.3 kg (09/23 0500)  Hemodynamic parameters for last 24 hours: PAP: (18-37)/(6-18) 25/9 CO:  [3.4 L/min-6.9 L/min] 6.9 L/min CI:  [1.7 L/min/m2-3.4 L/min/m2] 3.4 L/min/m2  Intake/Output from previous day: 09/22 0701 - 09/23 0700 In: 4003.3 [P.O.:200; I.V.:3053.8; IV Piggyback:749.6] Out: 3376 [Urine:2305; Blood:651; Chest Tube:420] Intake/Output this shift: No intake/output data recorded.  General appearance: alert, cooperative, and no distress Neurologic: intact Heart: regular rate and rhythm Lungs: diminished breath sounds bibasilar Abdomen: normal findings: soft, non-tender  Lab Results: Recent Labs    07/31/21 2001 07/31/21 2003 08/01/21 0415  WBC 15.0*  --  14.8*  HGB 11.9* 11.6* 11.3*  HCT 35.4* 34.0* 33.7*  PLT 245  --  234   BMET:  Recent Labs    07/31/21 2001 07/31/21 2003 08/01/21 0415  NA 136 139 134*  K 4.7 4.7 4.3  CL 110  --  107  CO2 19*  --  20*  GLUCOSE 140*  --  149*  BUN 10  --  12  CREATININE 1.12  --  1.01  CALCIUM 7.5*  --  7.6*    PT/INR:  Recent Labs    07/31/21 1430  LABPROT 15.9*  INR 1.3*   ABG    Component Value Date/Time   PHART 7.301 (L)  07/31/2021 2003   HCO3 19.2 (L) 07/31/2021 2003   TCO2 20 (L) 07/31/2021 2003   ACIDBASEDEF 7.0 (H) 07/31/2021 2003   O2SAT 97.0 07/31/2021 2003   CBG (last 3)  Recent Labs    07/31/21 1928 08/01/21 0020 08/01/21 0415  GLUCAP 127* 140* 137*    Assessment/Plan: S/P Procedure(s) (LRB): CORONARY ARTERY BYPASS GRAFTING (CABG)x 5 USING LIMA  AND RIGHT GSV. LIMA TO LAD, SVG TO OM, SVG TO DIAG., SVG TO PD-PL SEQUENTIALLY (N/A) TRANSESOPHAGEAL ECHOCARDIOGRAM (TEE) (N/A) APPLICATION OF CELL SAVER ENDOVEIN HARVEST OF GREATER SAPHENOUS VEIN (Right) ENDARTERECTOMY CORONARY ARTERY Looks great POD # 1 CV- good output, dc Swan and A line  ASA, beta blocker, statin  Add Plavix prior to DC  Diffuse disease- medical Rx RESP- IS for atelectasis RENAL- creatinine and lytes OK  Diurese for volume overload ENDO- CBG mildly elevated GI- advance diet as tolerated Anemia secondary to ABL- mild SCD + enoxaparin for DVT prophylaxis Dc chest tubes Cardiac rehab   LOS: 5 days    Melrose Nakayama 08/01/2021

## 2021-08-01 NOTE — Plan of Care (Signed)
  Problem: Education: Goal: Knowledge of General Education information will improve Description: Including pain rating scale, medication(s)/side effects and non-pharmacologic comfort measures Outcome: Progressing   Problem: Health Behavior/Discharge Planning: Goal: Ability to manage health-related needs will improve Outcome: Progressing   Problem: Respiratory: Goal: Respiratory status will improve Outcome: Progressing

## 2021-08-01 NOTE — Progress Notes (Addendum)
EVENING ROUNDS NOTE :     [address]       [place] [address]             [phone]                 1 Day Post-Op Procedure(s) (LRB): CORONARY ARTERY BYPASS GRAFTING (CABG)x 5 USING LIMA  AND RIGHT GSV. LIMA TO LAD, SVG TO OM, SVG TO DIAG., SVG TO PD-PL SEQUENTIALLY (N/A) TRANSESOPHAGEAL ECHOCARDIOGRAM (TEE) (N/A) APPLICATION OF CELL SAVER ENDOVEIN HARVEST OF GREATER SAPHENOUS VEIN (Right) ENDARTERECTOMY CORONARY ARTERY  Total Length of Stay:  LOS: 5 days  BP 134/76   Pulse 81   Temp 98.6 F (37 C) (Oral)   Resp (!) 24   Ht 5\' 9"  (1.753 m)   Wt 96.3 kg   SpO2 96%   BMI 31.35 kg/m   .Intake/Output      09/22 0701 09/23 0700 09/23 0701 09/24 0700   P.O. 200    I.V. (mL/kg) 3053.8 (31.7)    IV Piggyback 749.6    Total Intake(mL/kg) 4003.3 (41.6)    Urine (mL/kg/hr) 2305 (1)    Blood 651    Chest Tube 420    Total Output 3376    Net +627.3            sodium chloride     sodium chloride Stopped (08/01/21 1004)   sodium chloride 10 mL/hr at 07/31/21 1515    ceFAZolin (ANCEF) IV 2 g (08/01/21 1056)   dexmedetomidine (PRECEDEX) IV infusion Stopped (08/01/21 0425)   lactated ringers     lactated ringers     lactated ringers Stopped (08/01/21 0436)   nitroGLYCERIN     phenylephrine (NEO-SYNEPHRINE) Adult infusion Stopped (07/31/21 1647)     Lab Results  Component Value Date   WBC 14.8 (H) 08/01/2021   HGB 11.3 (L) 08/01/2021   HCT 33.7 (L) 08/01/2021   PLT 234 08/01/2021   GLUCOSE 149 (H) 08/01/2021   CHOL 217 (H) 07/27/2021   TRIG 96 07/27/2021   HDL 40 (L) 07/27/2021   LDLCALC 158 (H) 07/27/2021   ALT 33 03/24/2021   AST 31 03/24/2021   NA 134 (L) 08/01/2021   K 4.3 08/01/2021   CL 107 08/01/2021   CREATININE 1.01 08/01/2021   BUN 12 08/01/2021   CO2 20 (L) 08/01/2021   TSH 2.410 03/24/2021   INR 1.3 (H) 07/31/2021   HGBA1C 5.5 07/27/2021  Patient sleeping;wife sitting in chair SR, first degree heart block with HR int he  70's. On Lopressor 12.5 mg bid On 2 liters of oxygen via [place]. Wean as able over next few days    Lars Pinks PA-C 08/01/2021 3:07 PM   Agree with above  Lajuana Matte

## 2021-08-02 ENCOUNTER — Other Ambulatory Visit: Payer: Self-pay

## 2021-08-02 ENCOUNTER — Inpatient Hospital Stay (HOSPITAL_COMMUNITY): Payer: 59

## 2021-08-02 DIAGNOSIS — Z951 Presence of aortocoronary bypass graft: Secondary | ICD-10-CM

## 2021-08-02 LAB — CBC
HCT: 33 % — ABNORMAL LOW (ref 39.0–52.0)
Hemoglobin: 11.1 g/dL — ABNORMAL LOW (ref 13.0–17.0)
MCH: 28.5 pg (ref 26.0–34.0)
MCHC: 33.6 g/dL (ref 30.0–36.0)
MCV: 84.6 fL (ref 80.0–100.0)
Platelets: 249 10*3/uL (ref 150–400)
RBC: 3.9 MIL/uL — ABNORMAL LOW (ref 4.22–5.81)
RDW: 13.9 % (ref 11.5–15.5)
WBC: 15.9 10*3/uL — ABNORMAL HIGH (ref 4.0–10.5)
nRBC: 0 % (ref 0.0–0.2)

## 2021-08-02 LAB — BASIC METABOLIC PANEL
Anion gap: 7 (ref 5–15)
BUN: 19 mg/dL (ref 8–23)
CO2: 23 mmol/L (ref 22–32)
Calcium: 8.6 mg/dL — ABNORMAL LOW (ref 8.9–10.3)
Chloride: 102 mmol/L (ref 98–111)
Creatinine, Ser: 1.09 mg/dL (ref 0.61–1.24)
GFR, Estimated: >60 mL/min (ref >60)
Glucose, Bld: 137 mg/dL — ABNORMAL HIGH (ref 70–99)
Potassium: 4 mmol/L (ref 3.5–5.1)
Sodium: 132 mmol/L — ABNORMAL LOW (ref 135–145)

## 2021-08-02 LAB — GLUCOSE, CAPILLARY
Glucose-Capillary: 132 mg/dL — ABNORMAL HIGH (ref 70–99)
Glucose-Capillary: 123 mg/dL — ABNORMAL HIGH (ref 70–99)
Glucose-Capillary: 131 mg/dL — ABNORMAL HIGH (ref 70–99)
Glucose-Capillary: 132 mg/dL — ABNORMAL HIGH (ref 70–99)

## 2021-08-02 MED ORDER — SODIUM CHLORIDE 0.9 % IV SOLN: 250.0000 mL | INTRAVENOUS | Status: DC | PRN

## 2021-08-02 MED ORDER — SODIUM CHLORIDE 0.9% FLUSH
3.0000 mL | INTRAVENOUS | Status: DC | PRN
  Administered 2021-08-03: 3 mL via INTRAVENOUS

## 2021-08-02 MED ORDER — ROSUVASTATIN CALCIUM 20 MG PO TABS
20.0000 mg | ORAL_TABLET | Freq: Every day | ORAL | Status: DC
  Administered 2021-08-03 – 2021-08-04 (×2): 20 mg via ORAL
  Filled 2021-08-02 (×2): qty 1

## 2021-08-02 MED ORDER — SODIUM CHLORIDE 0.9% FLUSH
3.0000 mL | Freq: Two times a day (BID) | INTRAVENOUS | Status: DC
  Administered 2021-08-02 – 2021-08-03 (×3): 3 mL via INTRAVENOUS

## 2021-08-02 MED ORDER — AMIODARONE LOAD VIA INFUSION
150.0000 mg | Freq: Once | INTRAVENOUS | Status: AC
  Administered 2021-08-02: 150 mg via INTRAVENOUS
  Filled 2021-08-02: qty 83.34

## 2021-08-02 MED ORDER — OXYCODONE-ACETAMINOPHEN 5-325 MG PO TABS
1.0000 | ORAL_TABLET | Freq: Four times a day (QID) | ORAL | Status: DC | PRN
  Administered 2021-08-02: 2 via ORAL
  Filled 2021-08-02: qty 2

## 2021-08-02 MED ORDER — ~~LOC~~ CARDIAC SURGERY, PATIENT & FAMILY EDUCATION: Freq: Once | Status: AC

## 2021-08-02 MED ORDER — AMIODARONE HCL IN DEXTROSE 360-4.14 MG/200ML-% IV SOLN
60.0000 mg/h | INTRAVENOUS | Status: AC
  Administered 2021-08-02 (×2): 60 mg/h via INTRAVENOUS

## 2021-08-02 MED ORDER — AMIODARONE HCL IN DEXTROSE 360-4.14 MG/200ML-% IV SOLN
30.0000 mg/h | INTRAVENOUS | Status: DC
  Administered 2021-08-03: 30 mg/h via INTRAVENOUS
  Filled 2021-08-02: qty 200

## 2021-08-02 NOTE — Progress Notes (Signed)
Progress Note  Patient Name: Alexander Quinn Date of Encounter: 08/02/2021  [facility] Cardiologist: None   Subjective   Laying in bed, wife here. No SOB  Inpatient Medications    Scheduled Meds:  acetaminophen  1,000 mg Oral Q6H   Or   acetaminophen (TYLENOL) oral liquid 160 mg/5 mL  1,000 mg Per Tube Q6H   aspirin EC  325 mg Oral Daily   Or   aspirin  324 mg Per Tube Daily   bisacodyl  10 mg Oral Daily   Or   bisacodyl  10 mg Rectal Daily   Chlorhexidine Gluconate Cloth  6 each Topical Daily   clopidogrel  75 mg Oral Daily   docusate sodium  200 mg Oral Daily   enoxaparin (LOVENOX) injection  40 mg Subcutaneous QHS   insulin aspart  0-15 Units Subcutaneous TID WC   insulin aspart  0-5 Units Subcutaneous QHS   melatonin  3 mg Oral QHS   metoprolol tartrate  12.5 mg Oral BID   Or   metoprolol tartrate  12.5 mg Per Tube BID   pantoprazole  40 mg Oral Daily   rosuvastatin  10 mg Oral Daily   sodium chloride flush  10-40 mL Intracatheter Q12H   sodium chloride flush  3 mL Intravenous Q12H   cyanocobalamin  1,000 mcg Oral Daily   Continuous Infusions:  sodium chloride     sodium chloride Stopped (08/01/21 1004)   sodium chloride 10 mL/hr at 07/31/21 1515    ceFAZolin (ANCEF) IV 2 g (08/02/21 0917)   dexmedetomidine (PRECEDEX) IV infusion Stopped (08/01/21 0425)   lactated ringers     lactated ringers     lactated ringers Stopped (08/01/21 0436)   nitroGLYCERIN     phenylephrine (NEO-SYNEPHRINE) Adult infusion Stopped (07/31/21 1647)   PRN Meds: sodium chloride, lactated ringers, metoprolol tartrate, midazolam, morphine injection, ondansetron (ZOFRAN) IV, oxyCODONE, sodium chloride flush, sodium chloride flush, traMADol   Vital Signs    Vitals:   08/02/21 0600 08/02/21 0700 08/02/21 0800 08/02/21 0900  BP: (!) 142/81 128/67 102/79   Pulse: 85 75 86 80  Resp: (!) 31 18 (!) 21 20  Temp:      TempSrc:      SpO2: 93% 95% 94% 91%  Weight: 96.4 kg      Height:        Intake/Output Summary (Last 24 hours) at 08/02/2021 0929 Last data filed at 08/02/2021 0800 Gross per 24 hour  Intake 809.68 ml  Output 1400 ml  Net -590.32 ml   Last 3 Weights 08/02/2021 08/01/2021 07/31/2021  Weight (lbs) 212 lb 8.4 oz 212 lb 4.8 oz 197 lb 6.4 oz  Weight (kg) 96.4 kg 96.299 kg 89.54 kg      Telemetry    SR 70-80, one brief 4 beat NSVT - Personally Reviewed  ECG    Sr 84 - Personally Reviewed  Physical Exam   GEN: No acute distress.   Neck: No JVD Cardiac: RRR, no murmurs, rubs, or gallops. CABG scar Respiratory: Clear to auscultation bilaterally. GI: Soft, nontender, non-distended  MS: No edema; No deformity. Neuro:  Nonfocal  Psych: Normal affect   Labs    High Sensitivity Troponin:   Recent Labs  Lab 07/26/21 0955 07/26/21 1249 07/26/21 1457 07/26/21 1705  TROPONINIHS 44* 55* 64* 66*     Chemistry Recent Labs  Lab 07/31/21 2001 07/31/21 2003 08/01/21 0415 08/01/21 1819 08/02/21 0710  NA 136   < > 134*  134* 132*  K 4.7   < > 4.3 4.4 4.0  CL 110  --  107 102 102  CO2 19*  --  20* 22 23  GLUCOSE 140*  --  149* 160* 137*  BUN 10  --  12 17 19   CREATININE 1.12  --  1.01 1.26* 1.09  CALCIUM 7.5*  --  7.6* 8.4* 8.6*  MG 3.3*  --  2.8* 2.6*  --   GFRNONAA >60  --  >60 >60 >60  ANIONGAP 7  --  7 10 7    < > = values in this interval not displayed.    Lipids  Recent Labs  Lab 07/27/21 0246  CHOL 217*  TRIG 96  HDL 40*  LDLCALC 158*  CHOLHDL 5.4    Hematology Recent Labs  Lab 08/01/21 0415 08/01/21 1819 08/02/21 0710  WBC 14.8* 17.3* 15.9*  RBC 3.99* 4.29 3.90*  HGB 11.3* 11.9* 11.1*  HCT 33.7* 36.5* 33.0*  MCV 84.5 85.1 84.6  MCH 28.3 27.7 28.5  MCHC 33.5 32.6 33.6  RDW 13.8 14.0 13.9  PLT 234 266 249   Thyroid No results for input(s): TSH, FREET4 in the last 168 hours.  BNPNo results for input(s): BNP, PROBNP in the last 168 hours.  DDimer No results for input(s): DDIMER in the last 168 hours.    Radiology    DG Chest Port 1 View  Result Date: 08/02/2021 CLINICAL DATA:  Status post CABG. EXAM: PORTABLE CHEST 1 VIEW COMPARISON:  08/01/2021 FINDINGS: Status post median sternotomy and CABG. Stable cardiomediastinal contours. Interval removal of Swan-Ganz catheter, mediastinal drain and left chest tube. No signs of pneumothorax. Diminished aeration to the left base compatible with small pleural effusion and or atelectasis. No significant interstitial edema. IMPRESSION: Status post removal of Swan-Ganz catheter, mediastinal drain and left chest tube. No pneumothorax. Electronically Signed   By: Kerby Moors M.D.   On: 08/02/2021 08:28   DG Chest Port 1 View  Result Date: 08/01/2021 CLINICAL DATA:  Chest tube present, status post open heart surgery EXAM: PORTABLE CHEST 1 VIEW COMPARISON:  07/31/2021 FINDINGS: Interval removal of endotracheal and esophagogastric tube. Otherwise unchanged AP portable chest radiograph with left chest and mediastinal drainage tubes. No significant pneumothorax. Cardiomegaly status post median sternotomy. Right neck pulmonary vascular catheter remains with tip projecting over the right pulmonary arteries. IMPRESSION: 1.  Interval removal of endotracheal and esophagogastric tube. 2. Otherwise unchanged AP portable chest radiograph with left chest and mediastinal drainage tubes. No significant pneumothorax. Electronically Signed   By: Eddie Candle M.D.   On: 08/01/2021 08:59   DG Chest Port 1 View  Result Date: 07/31/2021 CLINICAL DATA:  Shortness of breath, status post CABG EXAM: PORTABLE CHEST 1 VIEW COMPARISON:  07/29/2018 FINDINGS: Status post interval median sternotomy and CABG with support apparatus including endotracheal tube, esophagogastric tube, right neck pulmonary vascular catheter, tip directed over the right pulmonary artery, and chest and left mediastinal drainage tubes. Cardiomegaly. No acute airspace opacity or pneumothorax. IMPRESSION: 1. Status post  interval median sternotomy and CABG with support apparatus as above. 2. Cardiomegaly. No acute airspace opacity or pneumothorax. Electronically Signed   By: Eddie Candle M.D.   On: 07/31/2021 15:11     Patient Profile     65 y.o. male post CABG x5 LIMA to LAD SVG to OM SVG to diagonal SVG to PDA/PL sequential.  Assessment & Plan    Severe coronary artery disease - Status post CABG as above.  Progressing.  Currently on aspirin as well as Plavix.  Watch for any signs of bleeding.  Sinus rhythm first-degree AV block - Heart rate currently in the 70-80s.  On low-dose beta-blocker, metoprolol 12.5 mg.  Since he had syncope with heart rates in the 40s to 50s prior to bypass not on any AV nodal blocking agents prior to admit, I will go ahead and discontinue his low-dose metoprolol.  Continue to monitor.  Hyperlipidemia - I will go ahead and increase his Crestor to 20 mg daily, high intensity dose given his underlying coronary artery disease.  Will follow.     For questions or updates, please contact [place] Please consult [email or web address] for contact info under        Signed, Candee Furbish, MD  08/02/2021, 9:29 AM

## 2021-08-02 NOTE — Progress Notes (Signed)
      [address]       [place] [address]             [phone]                 2 Days Post-Op Procedure(s) (LRB): CORONARY ARTERY BYPASS GRAFTING (CABG)x 5 USING LIMA  AND RIGHT GSV. LIMA TO LAD, SVG TO OM, SVG TO DIAG., SVG TO PD-PL SEQUENTIALLY (N/A) TRANSESOPHAGEAL ECHOCARDIOGRAM (TEE) (N/A) APPLICATION OF CELL SAVER ENDOVEIN HARVEST OF GREATER SAPHENOUS VEIN (Right) ENDARTERECTOMY CORONARY ARTERY   Events: No events _______________________________________________________________ Vitals: BP 139/72 (BP Location: Right Arm)   Pulse (!) 110   Temp 98.7 F (37.1 C) (Oral)   Resp (!) 24   Ht 5\' 9"  (1.753 m)   Wt 96.4 kg   SpO2 90%   BMI 31.38 kg/m  Filed Weights   07/31/21 0428 08/01/21 0500 08/02/21 0600  Weight: 89.5 kg 96.3 kg 96.4 kg     - Neuro: arousable  - Cardiovascular: sinus  Drips: none.      - Pulm: EWOB    ABG    Component Value Date/Time   PHART 7.301 (L) 07/31/2021 2003   PCO2ART 39.2 07/31/2021 2003   PO2ART 101 07/31/2021 2003   HCO3 19.2 (L) 07/31/2021 2003   TCO2 20 (L) 07/31/2021 2003   ACIDBASEDEF 7.0 (H) 07/31/2021 2003   O2SAT 97.0 07/31/2021 2003    - Abd: soft - Extremity: warm  .Intake/Output      09/23 0701 09/24 0700 09/24 0701 09/25 0700   P.O. 200 120   I.V. (mL/kg) 267.5 (2.8)    IV Piggyback 222.2 100   Total Intake(mL/kg) 689.7 (7.2) 220 (2.3)   Urine (mL/kg/hr) 1400 (0.6)    Blood     Chest Tube     Total Output 1400    Net -710.3 +220        Urine Occurrence  1 x      _______________________________________________________________ Labs: CBC Latest Ref Rng & Units 08/02/2021 08/01/2021 08/01/2021  WBC 4.0 - 10.5 K/uL 15.9(H) 17.3(H) 14.8(H)  Hemoglobin 13.0 - 17.0 g/dL 11.1(L) 11.9(L) 11.3(L)  Hematocrit 39.0 - 52.0 % 33.0(L) 36.5(L) 33.7(L)  Platelets 150 - 400 K/uL 249 266 234   CMP Latest Ref Rng & Units 08/02/2021 08/01/2021 08/01/2021  Glucose 70 - 99 mg/dL 137(H) 160(H) 149(H)   BUN 8 - 23 mg/dL 19 17 12   Creatinine 0.61 - 1.24 mg/dL 1.09 1.26(H) 1.01  Sodium 135 - 145 mmol/L 132(L) 134(L) 134(L)  Potassium 3.5 - 5.1 mmol/L 4.0 4.4 4.3  Chloride 98 - 111 mmol/L 102 102 107  CO2 22 - 32 mmol/L 23 22 20(L)  Calcium 8.9 - 10.3 mg/dL 8.6(L) 8.4(L) 7.6(L)  Total Protein 6.0 - 8.5 g/dL - - -  Total Bilirubin 0.0 - 1.2 mg/dL - - -  Alkaline Phos 44 - 121 IU/L - - -  AST 0 - 40 IU/L - - -  ALT 0 - 44 IU/L - - -    CXR: clear  _______________________________________________________________  Assessment and Plan: POD 2 s/p CABG  Neuro: pain controlled CV: on A/S/BB Pulm: continue pulm toilet Renal: creat stable.  Weight down GI: on diet Heme: stable ID: afebrile Endo: SSI Dispo: floor   Lizett Chowning O Jazalynn Mireles 08/02/2021 10:24 AM

## 2021-08-02 NOTE — Plan of Care (Signed)
  Problem: Education: Goal: Knowledge of General Education information will improve Description: Including pain rating scale, medication(s)/side effects and non-pharmacologic comfort measures Outcome: Progressing   Problem: Health Behavior/Discharge Planning: Goal: Ability to manage health-related needs will improve Outcome: Progressing   Problem: Education: Goal: Knowledge of disease or condition will improve Outcome: Progressing Goal: Knowledge of the prescribed therapeutic regimen will improve Outcome: Progressing   Problem: Respiratory: Goal: Respiratory status will improve Outcome: Progressing

## 2021-08-02 NOTE — Significant Event (Signed)
Patient went into A Fib with HR 130-150s, BP 140/60s. Patient was resting, asleep when this occurred. Patient denied pain. Called to Dr. Kipp Brood and received order for Amiodarone bolus and drip. Per MD, to keep patient in ICU if heart rate  >100bpm.

## 2021-08-03 LAB — BASIC METABOLIC PANEL
Anion gap: 11 (ref 5–15)
BUN: 16 mg/dL (ref 8–23)
CO2: 23 mmol/L (ref 22–32)
Calcium: 8.8 mg/dL — ABNORMAL LOW (ref 8.9–10.3)
Chloride: 97 mmol/L — ABNORMAL LOW (ref 98–111)
Creatinine, Ser: 1.05 mg/dL (ref 0.61–1.24)
GFR, Estimated: >60 mL/min (ref >60)
Glucose, Bld: 125 mg/dL — ABNORMAL HIGH (ref 70–99)
Potassium: 3.8 mmol/L (ref 3.5–5.1)
Sodium: 131 mmol/L — ABNORMAL LOW (ref 135–145)

## 2021-08-03 LAB — CBC
HCT: 34.8 % — ABNORMAL LOW (ref 39.0–52.0)
Hemoglobin: 11.5 g/dL — ABNORMAL LOW (ref 13.0–17.0)
MCH: 28 pg (ref 26.0–34.0)
MCHC: 33 g/dL (ref 30.0–36.0)
MCV: 84.9 fL (ref 80.0–100.0)
Platelets: 265 10*3/uL (ref 150–400)
RBC: 4.1 MIL/uL — ABNORMAL LOW (ref 4.22–5.81)
RDW: 13.9 % (ref 11.5–15.5)
WBC: 14.8 10*3/uL — ABNORMAL HIGH (ref 4.0–10.5)
nRBC: 0 % (ref 0.0–0.2)

## 2021-08-03 LAB — GLUCOSE, CAPILLARY
Glucose-Capillary: 118 mg/dL — ABNORMAL HIGH (ref 70–99)
Glucose-Capillary: 130 mg/dL — ABNORMAL HIGH (ref 70–99)
Glucose-Capillary: 118 mg/dL — ABNORMAL HIGH (ref 70–99)
Glucose-Capillary: 118 mg/dL — ABNORMAL HIGH (ref 70–99)

## 2021-08-03 MED ORDER — METOPROLOL TARTRATE 25 MG PO TABS
25.0000 mg | ORAL_TABLET | Freq: Two times a day (BID) | ORAL | Status: DC
  Administered 2021-08-03 – 2021-08-04 (×2): 25 mg via ORAL
  Filled 2021-08-03 (×2): qty 1

## 2021-08-03 MED ORDER — AMIODARONE HCL 200 MG PO TABS
400.0000 mg | ORAL_TABLET | Freq: Two times a day (BID) | ORAL | Status: DC
  Administered 2021-08-03 – 2021-08-04 (×3): 400 mg via ORAL
  Filled 2021-08-03 (×3): qty 2

## 2021-08-03 MED ORDER — TAMSULOSIN HCL 0.4 MG PO CAPS
0.4000 mg | ORAL_CAPSULE | Freq: Every day | ORAL | Status: DC
  Administered 2021-08-03: 0.4 mg via ORAL
  Filled 2021-08-03: qty 1

## 2021-08-03 MED ORDER — METOPROLOL TARTRATE 12.5 MG HALF TABLET
12.5000 mg | ORAL_TABLET | Freq: Two times a day (BID) | ORAL | Status: DC
  Administered 2021-08-03: 12.5 mg via ORAL
  Filled 2021-08-03: qty 1

## 2021-08-03 NOTE — Progress Notes (Signed)
Patient complaints of frequent urination, without pain. MD notified - see new orders

## 2021-08-03 NOTE — Progress Notes (Signed)
Progress Note  Patient Name: Alexander Quinn Date of Encounter: 08/03/2021  [facility] Cardiologist: None   Subjective   Comfortable, no shortness of breath.  Laying comfortably in bed.  No further atrial fibrillation.  Wife at bedside.  He has had some frequent nocturia.  Inpatient Medications    Scheduled Meds:  acetaminophen  1,000 mg Oral Q6H   Or   acetaminophen (TYLENOL) oral liquid 160 mg/5 mL  1,000 mg Per Tube Q6H   aspirin EC  325 mg Oral Daily   Or   aspirin  324 mg Per Tube Daily   bisacodyl  10 mg Oral Daily   Or   bisacodyl  10 mg Rectal Daily   Chlorhexidine Gluconate Cloth  6 each Topical Daily   clopidogrel  75 mg Oral Daily   docusate sodium  200 mg Oral Daily   enoxaparin (LOVENOX) injection  40 mg Subcutaneous QHS   insulin aspart  0-15 Units Subcutaneous TID WC   insulin aspart  0-5 Units Subcutaneous QHS   melatonin  3 mg Oral QHS   pantoprazole  40 mg Oral Daily   rosuvastatin  20 mg Oral Daily   sodium chloride flush  10-40 mL Intracatheter Q12H   sodium chloride flush  3 mL Intravenous Q12H   sodium chloride flush  3 mL Intravenous Q12H   cyanocobalamin  1,000 mcg Oral Daily   Continuous Infusions:  sodium chloride     sodium chloride Stopped (08/01/21 1004)   sodium chloride 10 mL/hr at 07/31/21 1515   sodium chloride     amiodarone 30 mg/hr (08/03/21 0654)   dexmedetomidine (PRECEDEX) IV infusion Stopped (08/01/21 0425)   lactated ringers     lactated ringers     lactated ringers Stopped (08/01/21 0436)   nitroGLYCERIN     phenylephrine (NEO-SYNEPHRINE) Adult infusion Stopped (07/31/21 1647)   PRN Meds: sodium chloride, sodium chloride, lactated ringers, metoprolol tartrate, midazolam, morphine injection, ondansetron (ZOFRAN) IV, oxyCODONE-acetaminophen, sodium chloride flush, sodium chloride flush, sodium chloride flush, traMADol   Vital Signs    Vitals:   08/03/21 0600 08/03/21 0700 08/03/21 0740 08/03/21 0800  BP: 125/64  116/71    Pulse: 75 75  83  Resp: 16 15  17   Temp:   98.5 F (36.9 C)   TempSrc:   Oral   SpO2: 91% 92%  90%  Weight: 96.1 kg     Height:        Intake/Output Summary (Last 24 hours) at 08/03/2021 0915 Last data filed at 08/03/2021 0654 Gross per 24 hour  Intake 790.42 ml  Output 1150 ml  Net -359.58 ml   Last 3 Weights 08/03/2021 08/02/2021 08/01/2021  Weight (lbs) 211 lb 13.8 oz 212 lb 8.4 oz 212 lb 4.8 oz  Weight (kg) 96.1 kg 96.4 kg 96.299 kg      Telemetry    Yesterday evening brief postoperative atrial fibrillation.  Now sinus rhythm- Personally Reviewed  ECG    Sr 84 - Personally Reviewed  Physical Exam   GEN: No acute distress.   Neck: No JVD Cardiac: RRR, no murmurs, rubs, or gallops. CABG scar Respiratory: Clear to auscultation bilaterally. GI: Soft, nontender, non-distended  MS: No edema; No deformity. Neuro:  Nonfocal  Psych: Normal affect   Labs    High Sensitivity Troponin:   Recent Labs  Lab 07/26/21 0955 07/26/21 1249 07/26/21 1457 07/26/21 1705  TROPONINIHS 44* 55* 64* 66*     Chemistry Recent Labs  Lab 07/31/21  2001 07/31/21 2003 08/01/21 0415 08/01/21 1819 08/02/21 0710 08/03/21 0428  NA 136   < > 134* 134* 132* 131*  K 4.7   < > 4.3 4.4 4.0 3.8  CL 110  --  107 102 102 97*  CO2 19*  --  20* 22 23 23   GLUCOSE 140*  --  149* 160* 137* 125*  BUN 10  --  12 17 19 16   CREATININE 1.12  --  1.01 1.26* 1.09 1.05  CALCIUM 7.5*  --  7.6* 8.4* 8.6* 8.8*  MG 3.3*  --  2.8* 2.6*  --   --   GFRNONAA >60  --  >60 >60 >60 >60  ANIONGAP 7  --  7 10 7 11    < > = values in this interval not displayed.    Lipids  No results for input(s): CHOL, TRIG, HDL, LABVLDL, LDLCALC, CHOLHDL in the last 168 hours.   Hematology Recent Labs  Lab 08/01/21 1819 08/02/21 0710 08/03/21 0428  WBC 17.3* 15.9* 14.8*  RBC 4.29 3.90* 4.10*  HGB 11.9* 11.1* 11.5*  HCT 36.5* 33.0* 34.8*  MCV 85.1 84.6 84.9  MCH 27.7 28.5 28.0  MCHC 32.6 33.6 33.0  RDW 14.0  13.9 13.9  PLT 266 249 265   Thyroid No results for input(s): TSH, FREET4 in the last 168 hours.  BNPNo results for input(s): BNP, PROBNP in the last 168 hours.  DDimer No results for input(s): DDIMER in the last 168 hours.   Radiology    DG Chest Port 1 View  Result Date: 08/02/2021 CLINICAL DATA:  Status post CABG. EXAM: PORTABLE CHEST 1 VIEW COMPARISON:  08/01/2021 FINDINGS: Status post median sternotomy and CABG. Stable cardiomediastinal contours. Interval removal of Swan-Ganz catheter, mediastinal drain and left chest tube. No signs of pneumothorax. Diminished aeration to the left base compatible with small pleural effusion and or atelectasis. No significant interstitial edema. IMPRESSION: Status post removal of Swan-Ganz catheter, mediastinal drain and left chest tube. No pneumothorax. Electronically Signed   By: Kerby Moors M.D.   On: 08/02/2021 08:28     Patient Profile     65 y.o. male post CABG x5 LIMA to LAD SVG to OM SVG to diagonal SVG to PDA/PL sequential.  Brief postoperative atrial fibrillation.  Assessment & Plan     Paroxysmal atrial fibrillation postoperatively - Brief episode last evening.  On IV amiodarone.  Back to sinus rhythm.  Transition to p.o. amiodarone either later this afternoon or tomorrow. -I will go ahead and add back low-dose metoprolol 12.5 mg twice a day.  I discontinued this yesterday given his prior history of heart rates in the 40s and 50s with first-degree AV block prior to CABG.  Perhaps this was more ischemically mediated.  Clearly he had atrial fibrillation and low-dose beta-blocker may be beneficial in this setting.  Severe coronary artery disease - Status post CABG as above.  Progressing.  Currently on aspirin as well as Plavix.  Watch for any signs of bleeding.  Overall doing well.  Hyperlipidemia -  Crestor to 20 mg daily, high intensity dose given his underlying coronary artery disease.  Frequent nocturia - I will give him Flomax 0.4  mg.  Will follow.     For questions or updates, please contact [place] Please consult [email or web address] for contact info under        Signed, Candee Furbish, MD  08/03/2021, 9:15 AM

## 2021-08-03 NOTE — Progress Notes (Signed)
Pt arrived from [facility] via wheelchair. Pt/wife oriented to unit. VSS, CCMD called, CHG bath, meal ordered, pt given urinals and pt/wife aware that bed alarm will be utilized at [facility] for safety. Pt unable to wear cpap in past and has been using 2 liter oxygen via nasal canula. Pt resting with call bell within reach.  Will continue to monitor.

## 2021-08-03 NOTE — Progress Notes (Signed)
      [address]       [place] [address]             [phone]                 3 Days Post-Op Procedure(s) (LRB): CORONARY ARTERY BYPASS GRAFTING (CABG)x 5 USING LIMA  AND RIGHT GSV. LIMA TO LAD, SVG TO OM, SVG TO DIAG., SVG TO PD-PL SEQUENTIALLY (N/A) TRANSESOPHAGEAL ECHOCARDIOGRAM (TEE) (N/A) APPLICATION OF CELL SAVER ENDOVEIN HARVEST OF GREATER SAPHENOUS VEIN (Right) ENDARTERECTOMY CORONARY ARTERY   Events: No events Afib overnight, converted _______________________________________________________________ Vitals: BP (!) 143/79   Pulse 83   Temp 98.5 F (36.9 C) (Oral)   Resp 19   Ht 5\' 9"  (1.753 m)   Wt 96.1 kg   SpO2 95%   BMI 31.29 kg/m  Filed Weights   08/01/21 0500 08/02/21 0600 08/03/21 0600  Weight: 96.3 kg 96.4 kg 96.1 kg     - Neuro: alert NAd  - Cardiovascular: sinus  Drips: none.      - Pulm: EWOB    ABG    Component Value Date/Time   PHART 7.301 (L) 07/31/2021 2003   PCO2ART 39.2 07/31/2021 2003   PO2ART 101 07/31/2021 2003   HCO3 19.2 (L) 07/31/2021 2003   TCO2 20 (L) 07/31/2021 2003   ACIDBASEDEF 7.0 (H) 07/31/2021 2003   O2SAT 97.0 07/31/2021 2003    - Abd: soft - Extremity: warm  .Intake/Output      09/24 0701 09/25 0700 09/25 0701 09/26 0700   P.O. 360    I.V. (mL/kg) 450.4 (4.7)    IV Piggyback 100    Total Intake(mL/kg) 910.4 (9.5)    Urine (mL/kg/hr) 1150 (0.5)    Stool 0    Total Output 1150    Net -239.6         Urine Occurrence 4 x 1 x   Stool Occurrence 1 x       _______________________________________________________________ Labs: CBC Latest Ref Rng & Units 08/03/2021 08/02/2021 08/01/2021  WBC 4.0 - 10.5 K/uL 14.8(H) 15.9(H) 17.3(H)  Hemoglobin 13.0 - 17.0 g/dL 11.5(L) 11.1(L) 11.9(L)  Hematocrit 39.0 - 52.0 % 34.8(L) 33.0(L) 36.5(L)  Platelets 150 - 400 K/uL 265 249 266   CMP Latest Ref Rng & Units 08/03/2021 08/02/2021 08/01/2021  Glucose 70 - 99 mg/dL 125(H) 137(H) 160(H)  BUN 8 -  23 mg/dL 16 19 17   Creatinine 0.61 - 1.24 mg/dL 1.05 1.09 1.26(H)  Sodium 135 - 145 mmol/L 131(L) 132(L) 134(L)  Potassium 3.5 - 5.1 mmol/L 3.8 4.0 4.4  Chloride 98 - 111 mmol/L 97(L) 102 102  CO2 22 - 32 mmol/L 23 23 22   Calcium 8.9 - 10.3 mg/dL 8.8(L) 8.6(L) 8.4(L)  Total Protein 6.0 - 8.5 g/dL - - -  Total Bilirubin 0.0 - 1.2 mg/dL - - -  Alkaline Phos 44 - 121 IU/L - - -  AST 0 - 40 IU/L - - -  ALT 0 - 44 IU/L - - -    CXR: Clear-  _______________________________________________________________  Assessment and Plan: POD 3 s/p CABG  Neuro: pain controlled CV: on A/S/BB, increasing BB.  Transitioning to PO amio Pulm: continue pulm toilet Renal: creat stable.   GI: on diet Heme: stable ID: afebrile Endo: SSI Dispo: floor.  Awaiting bed   Lakshmi Sundeen O Wynetta Seith 08/03/2021 10:04 AM

## 2021-08-04 ENCOUNTER — Inpatient Hospital Stay (HOSPITAL_COMMUNITY): Payer: 59

## 2021-08-04 DIAGNOSIS — I480 Paroxysmal atrial fibrillation: Secondary | ICD-10-CM

## 2021-08-04 MED ORDER — ROSUVASTATIN CALCIUM 20 MG PO TABS: 20.0000 mg | ORAL_TABLET | Freq: Every day | ORAL | 3 refills | Status: DC

## 2021-08-04 MED ORDER — TAMSULOSIN HCL 0.4 MG PO CAPS: 0.4000 mg | ORAL_CAPSULE | Freq: Every day | ORAL | 3 refills | Status: DC

## 2021-08-04 MED ORDER — CLOPIDOGREL BISULFATE 75 MG PO TABS: 75.0000 mg | ORAL_TABLET | Freq: Every day | ORAL | 3 refills | Status: DC

## 2021-08-04 MED ORDER — METOPROLOL TARTRATE 25 MG PO TABS: 25.0000 mg | ORAL_TABLET | Freq: Two times a day (BID) | ORAL | 3 refills | Status: DC

## 2021-08-04 MED ORDER — TRAMADOL HCL 50 MG PO TABS: 50.0000 mg | ORAL_TABLET | Freq: Four times a day (QID) | ORAL | 0 refills | Status: DC | PRN

## 2021-08-04 MED ORDER — POTASSIUM CHLORIDE CRYS ER 20 MEQ PO TBCR: 20.0000 meq | EXTENDED_RELEASE_TABLET | Freq: Once | ORAL | Status: DC

## 2021-08-04 MED ORDER — ASPIRIN EC 81 MG PO TBEC: 81.0000 mg | DELAYED_RELEASE_TABLET | Freq: Every day | ORAL | 2 refills | Status: AC

## 2021-08-04 MED ORDER — AMIODARONE HCL 200 MG PO TABS: 400.0000 mg | ORAL_TABLET | Freq: Two times a day (BID) | ORAL | 1 refills | Status: DC

## 2021-08-04 MED ORDER — ACETAMINOPHEN 325 MG PO TABS: 650.0000 mg | ORAL_TABLET | Freq: Four times a day (QID) | ORAL | Status: DC | PRN

## 2021-08-04 NOTE — Progress Notes (Signed)
CARDIAC REHAB PHASE I   Offered to walk with pt. Pt states independent ambulation without difficulty. D/c education completed with pt and wife. Pt educated on importance of site care and monitoring incisions daily. Encouraged continued IS use, walks, and sternal precautions. Pt given in-the-tube sheet along with heart healthy diet. Reviewed restrictions and exercise guidelines. Will refer to CRP II [place].   [id number] Rufina Falco, RN BSN 08/04/2021 9:21 AM

## 2021-08-04 NOTE — TOC Transition Note (Signed)
Transition of Care [facility]) - CM/SW Discharge Note   Patient Details  Name: Alexander Quinn MRN: [id number] Date of Birth: 1956/08/26  [facility]) CM/SW Contact:  Cyndi Bender, RN Phone Number: 08/04/2021, 2:45 PM   Clinical Narrative:    Patient stable for discharge. Order for DME, rolling walker.Patient agreeable to use in house DME provider, [employer]. Call made to [employer] and rolling walker to be delivered to room prior to discharge.    Final next level of care: Home/Self Care Barriers to Discharge: Barriers Resolved   Patient Goals and CMS Choice Patient states their goals for this hospitalization and ongoing recovery are:: return home CMS Medicare.gov Compare Post Acute Care list provided to:: Patient Choice offered to / list presented to : Patient  Discharge Placement               Home         Discharge Plan and Services                DME Arranged: Walker rolling DME Agency: [employer] Date DME Agency Contacted: 08/04/21 Time DME Agency Contacted: 1000 Representative spoke with at DME Agency: [place] Interventions     Readmission Risk Interventions Readmission Risk Prevention Plan 08/04/2021  Post Dischage Appt Not Complete  Medication Screening Complete  Transportation Screening Complete  Some recent data might be hidden

## 2021-08-04 NOTE — Progress Notes (Signed)
Patient d/c to home with wife. All sutures removed, discharge instructions reviewed. Patient verbalized understanding. VS stable upon discharge. He left via private vehicle with wife. Walker and all personal belongings sent home. Fuller Canada, RN

## 2021-08-04 NOTE — Progress Notes (Addendum)
Progress Note  Patient Name: Alexander Quinn Date of Encounter: 08/04/2021  [facility] Cardiologist: None Dr. Irish Lack   Subjective   Feeling well. No chest pain, sob or palpitations.    Inpatient Medications    Scheduled Meds:  acetaminophen  1,000 mg Oral Q6H   Or   acetaminophen (TYLENOL) oral liquid 160 mg/5 mL  1,000 mg Per Tube Q6H   amiodarone  400 mg Oral BID   aspirin EC  325 mg Oral Daily   Or   aspirin  324 mg Per Tube Daily   bisacodyl  10 mg Oral Daily   Or   bisacodyl  10 mg Rectal Daily   clopidogrel  75 mg Oral Daily   docusate sodium  200 mg Oral Daily   melatonin  3 mg Oral QHS   metoprolol tartrate  25 mg Oral BID   pantoprazole  40 mg Oral Daily   rosuvastatin  20 mg Oral Daily   tamsulosin  0.4 mg Oral QPC supper   cyanocobalamin  1,000 mcg Oral Daily   Continuous Infusions:  PRN Meds: metoprolol tartrate, ondansetron (ZOFRAN) IV, oxyCODONE-acetaminophen, traMADol   Vital Signs    Vitals:   08/03/21 2306 08/04/21 0343 08/04/21 0500 08/04/21 0819  BP: (!) 96/51 128/74  (!) 105/55  Pulse: 90 80  86  Resp: 20 20  18   Temp: 98.2 F (36.8 C) 98.2 F (36.8 C)  98.5 F (36.9 C)  TempSrc: Oral Oral  Oral  SpO2: 96% 94%  94%  Weight:   93.4 kg   Height:        Intake/Output Summary (Last 24 hours) at 08/04/2021 0843 Last data filed at 08/04/2021 0347 Gross per 24 hour  Intake 393.28 ml  Output 1500 ml  Net -1106.72 ml   Last 3 Weights 08/04/2021 08/03/2021 08/03/2021  Weight (lbs) 205 lb 14.6 oz 212 lb 11.9 oz 211 lb 13.8 oz  Weight (kg) 93.4 kg 96.5 kg 96.1 kg      Telemetry    NSR 80s - Personally Reviewed  ECG    N/A  Physical Exam   GEN: No acute distress.   Neck: No JVD Cardiac: RRR, no murmurs, rubs, or gallops.  Respiratory: Clear to auscultation bilaterally. GI: Soft, nontender, non-distended  MS: No edema; No deformity. Neuro:  Nonfocal  Psych: Normal affect   Labs    High Sensitivity Troponin:   Recent  Labs  Lab 07/26/21 0955 07/26/21 1249 07/26/21 1457 07/26/21 1705  TROPONINIHS 44* 55* 64* 66*     Chemistry Recent Labs  Lab 07/31/21 2001 07/31/21 2003 08/01/21 0415 08/01/21 1819 08/02/21 0710 08/03/21 0428  NA 136   < > 134* 134* 132* 131*  K 4.7   < > 4.3 4.4 4.0 3.8  CL 110  --  107 102 102 97*  CO2 19*  --  20* 22 23 23   GLUCOSE 140*  --  149* 160* 137* 125*  BUN 10  --  12 17 19 16   CREATININE 1.12  --  1.01 1.26* 1.09 1.05  CALCIUM 7.5*  --  7.6* 8.4* 8.6* 8.8*  MG 3.3*  --  2.8* 2.6*  --   --   GFRNONAA >60  --  >60 >60 >60 >60  ANIONGAP 7  --  7 10 7 11    < > = values in this interval not displayed.    Lipids No results for input(s): CHOL, TRIG, HDL, LABVLDL, LDLCALC, CHOLHDL in the last 168  hours.  Hematology Recent Labs  Lab 08/01/21 1819 08/02/21 0710 08/03/21 0428  WBC 17.3* 15.9* 14.8*  RBC 4.29 3.90* 4.10*  HGB 11.9* 11.1* 11.5*  HCT 36.5* 33.0* 34.8*  MCV 85.1 84.6 84.9  MCH 27.7 28.5 28.0  MCHC 32.6 33.6 33.0  RDW 14.0 13.9 13.9  PLT 266 249 265    Radiology    No results found.  Cardiac Studies   Echo 07/27/21 1. Left ventricular ejection fraction, by estimation, is 60 to 65%. The  left ventricle has normal function. The left ventricle has no regional  wall motion abnormalities. There is mild concentric left ventricular  hypertrophy. Left ventricular diastolic  parameters were normal.   2. Right ventricular systolic function is normal. The right ventricular  size is normal. Tricuspid regurgitation signal is inadequate for assessing  PA pressure.   3. The mitral valve is grossly normal. Trivial mitral valve  regurgitation. No evidence of mitral stenosis.   4. The aortic valve is tricuspid. Aortic valve regurgitation is not  visualized. No aortic stenosis is present.   5. The inferior vena cava is normal in size with greater than 50%  respiratory variability, suggesting right atrial pressure of 3 mmHg.    Conclusion(s)/Recommendation(s): Normal biventricular function without  evidence of hemodynamically significant valvular heart disease LEFT HEART CATH AND CORONARY ANGIOGRAPHY    Conclusion       Mid LM lesion is 30% stenosed.   RPDA lesion is 75% stenosed.   RPAV lesion is 80% stenosed.   Prox Cx to Mid Cx lesion is 90% stenosed.   Mid LAD-2 lesion is 99% stenosed.   Mid LAD-1 lesion is 50% stenosed.   Dist RCA lesion is 50% stenosed.   The left ventricular systolic function is normal.   LV end diastolic pressure is normal.   The left ventricular ejection fraction is 55-65% by visual estimate.   There is no aortic valve stenosis.   Severe three vessel CAD. Mid LAD subtotal occlusion at a large diagonal branch.  Proximal to mid circumflex lesion and distal RCA and branch vessel disease.     Plan for cardiac surgery consult.    Diagnostic Dominance: Right        Patient Profile     65 y.o. male with a past medical history of HTN, HLD and obesity who is being seen for the evaluation of syncope and elevated troponin values at the request of Dr. Tomi Bamberger.   He reports being physically active at baseline as he has a garden and also enjoys playing basketball at the [facility]. he initially felt in his normal state of health while playing basketball but started to develop worsening dyspnea and associated dizziness. He walked over to a chair and says he "slumped over". Someone who works at [employer] came to him and he quickly came back to consciousness.  Assessment & Plan    Exertional dyspnea and syncope/CAD: Troponin 44>>55>>64>>66. Echo with normal LVEF of 60-65%, no regional WM abnormality. No valvular abnormality. Cath showed multivessel CAD, now S/p CABG.  -- Continue ASA,  Plavix, Imdur, BB and statin    HLD: 07/27/2021: Cholesterol 217; HDL 40; LDL Cholesterol 158; Triglycerides 96; VLDL 19  -- LDL goal less than 70 -- Continue Crestor 40mg  daily    HTN: Bp borderline  controlled.  - Discontinued amlodipine due to failure and sloppiness - Stopped ARB pre-cabg, will continue to hold as BP is fluctuating  - Continue BB   PAF: Post op have  afib. Converted on sinus rhythm on IV amiodarone. Maintaining sinus rhythm. Continue PO amiodarone 400mg  BID x 7 days>> 200mg  BID x 7 days then 200mg  qd>>> total duration per MD - Continue BB - HR was 40s and 50s with first-degree AV block prior to CABG>> currently stable at 80s   Likely DC later today. Will sign off. Call with questions.   For questions or updates, please contact [place] Please consult [email or web address] for contact info under        SignedLeanor Kail, PA  08/04/2021, 8:43 AM     Agree with assessment and plan.  Maintaining sinus rhythm on amiodarone with taper schedule as noted above.  Continue aggressive lipid-lowering therapy with target LDL less than 70.  Discharge today.  Cardiology follow-up with Dr. Ladona Ridgel, MD, [facility] 08/04/2021 1:46 PM

## 2021-08-04 NOTE — Progress Notes (Signed)
      [address]       [place] [address]             [phone]      4 Days Post-Op Procedure(s) (LRB): CORONARY ARTERY BYPASS GRAFTING (CABG)x 5 USING LIMA  AND RIGHT GSV. LIMA TO LAD, SVG TO OM, SVG TO DIAG., SVG TO PD-PL SEQUENTIALLY (N/A) TRANSESOPHAGEAL ECHOCARDIOGRAM (TEE) (N/A) APPLICATION OF CELL SAVER ENDOVEIN HARVEST OF GREATER SAPHENOUS VEIN (Right) ENDARTERECTOMY CORONARY ARTERY  Subjective: Patient overall feels good.  He is a little tired as he has already been up walking up and down the hallway several times.  + BM  Objective: Vital signs in last 24 hours: Temp:  [98.2 F (36.8 C)-99.3 F (37.4 C)] 98.2 F (36.8 C) (09/26 0343) Pulse Rate:  [79-106] 80 (09/26 0343) Cardiac Rhythm: Normal sinus rhythm (09/25 1900) Resp:  [14-25] 20 (09/26 0343) BP: (96-149)/(51-79) 128/74 (09/26 0343) SpO2:  [90 %-99 %] 94 % (09/26 0343) Weight:  [93.4 kg-96.5 kg] 93.4 kg (09/26 0500)  Intake/Output from previous day: 09/25 0701 - 09/26 0700 In: 289.9 [P.O.:240; I.V.:49.9] Out: 1500 [Urine:1500]  General appearance: alert, cooperative, and no distress Heart: regular rate and rhythm Lungs: clear to auscultation bilaterally Abdomen: soft, non-tender; bowel sounds normal; no masses,  no organomegaly Extremities: edema trace Wound: clean and dry  Lab Results: Recent Labs    08/02/21 0710 08/03/21 0428  WBC 15.9* 14.8*  HGB 11.1* 11.5*  HCT 33.0* 34.8*  PLT 249 265   BMET:  Recent Labs    08/02/21 0710 08/03/21 0428  NA 132* 131*  K 4.0 3.8  CL 102 97*  CO2 23 23  GLUCOSE 137* 125*  BUN 19 16  CREATININE 1.09 1.05  CALCIUM 8.6* 8.8*    PT/INR: No results for input(s): LABPROT, INR in the last 72 hours. ABG    Component Value Date/Time   PHART 7.301 (L) 07/31/2021 2003   HCO3 19.2 (L) 07/31/2021 2003   TCO2 20 (L) 07/31/2021 2003   ACIDBASEDEF 7.0 (H) 07/31/2021 2003   O2SAT 97.0 07/31/2021 2003   CBG (last 3)  Recent Labs     08/03/21 1115 08/03/21 1602 08/03/21 2308  GLUCAP 130* 118* 118*    Assessment/Plan: S/P Procedure(s) (LRB): CORONARY ARTERY BYPASS GRAFTING (CABG)x 5 USING LIMA  AND RIGHT GSV. LIMA TO LAD, SVG TO OM, SVG TO DIAG., SVG TO PD-PL SEQUENTIALLY (N/A) TRANSESOPHAGEAL ECHOCARDIOGRAM (TEE) (N/A) APPLICATION OF CELL SAVER ENDOVEIN HARVEST OF GREATER SAPHENOUS VEIN (Right) ENDARTERECTOMY CORONARY ARTERY  CV- PAF, maintaining NSR- on oral Amiodarone, Lopressor, BP stable Pulm- no acute issues, off oxygen, small pleural effusion on last CXR, will get 2V CXR Renal- creatinine stable, K is at 3.8, will repeat BMET in AM, minimal volume overloaded Expected post operative blood loss anemia- Hgb stable at 11.5 CBGs controlled- not a diabetic, will stop SSIP, CBGs Dispo- patient stable, maintaining NSR, continue Amiodarone, Lopressor, repeat CXR in AM, BMET in AM..... patient wants to go home, wife is a apprehensive and would prefer in the AM..... will see what Dr. Roxan Hockey thinks about discharge this afternoon vs. AM   LOS: 8 days   Ellwood Handler, PA-C 08/04/2021

## 2021-08-05 ENCOUNTER — Telehealth: Payer: Self-pay

## 2021-08-05 NOTE — Telephone Encounter (Signed)
Patient's wife, Ivin Booty contacted the office concerned of patient's heart rate. She stated that he cannot sleep. He is s/p CABG with Dr. Roxan Hockey and was discharged from the hospital 08/04/21. Patient is not in any pain. She stated that his "pulse is not regular" upon palpation and he does get lightheaded at times. His blood pressure is 116/82 and heart rate 120's-130's when checked. He is not moving when this happens. Advised that inability to sleep is common after surgery but that he is ok to take over-the-counter Tylenol PM or up to 10 mg of Melatonin as needed to help him sleep.   Ellwood Handler, PA advised patient to take 1/2 dose 12.5mg  extra of Metoprolol to help regulate his heart rate, or if that does not help, he would need to schedule an appointment with Cardiology for EKG and workup before his scheduled post-op appointment. Also advised that if patient does not feel well and begins to deteriorate that he needed to go to the ED or call 911.   She acknowledged receipt when called back to give recommendations she stated that patient's heart rate was now 102 and after his shower he felt much better.

## 2021-08-12 ENCOUNTER — Telehealth (HOSPITAL_COMMUNITY): Payer: Self-pay

## 2021-08-12 NOTE — Telephone Encounter (Signed)
Per phase I cardiac rehab, fax cardiac rehab referral to [facility]. 

## 2021-08-19 NOTE — Progress Notes (Signed)
Cardiology Office Note   Date:  08/20/2021   ID:  Alexander Quinn, DOB [birth year], MRN [id number]  PCP:  Alexander Lathe, MD    No chief complaint on file.  CAD  Wt Readings from Last 3 Encounters:  08/20/21 194 lb (88 kg)  08/04/21 205 lb 14.6 oz (93.4 kg)  04/01/21 204 lb (92.5 kg)       History of Present Illness: Alexander Quinn is a 65 y.o. male  with past history of CAD.  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  He has past medical history significant for hypertension, dyslipidemia, obesity, and suspected coronary artery disease.  He has a 12-pack-year smoking history but has not smoked since 1981.  He was apparently having some cardiac symptoms last year and underwent a Cardiolite stress test by [facility] health care.  This demonstrated mild to moderate lateral wall ischemia.  He did not follow through with any further work-up preferring to get a second opinion but has not seen any other healthcare provider for management of his suspected coronary disease in the interim.  He presented to the emergency room at [facility] by way of EMS on 07/26/2021 after having a syncopal episode while playing basketball at the [facility].  This episode apparently lasted less than a minute and was accompanied by shortness of breath.  In the emergency room, he was noted to have a blood pressure of 190/94.  EKG showed normal sinus rhythm with no acute ischemic changes.  Chest x-ray was unremarkable.  Initial troponin was 44 and serial levels rose slightly to 55 in 66.  Given his previous positive Cardiolite stress test, decision was made to admit to the hospital for further evaluation and management.  He was seen by cardiology service.  He was started on aspirin, heparin infusion, and rosuvastatin.  He was also started on Avapro for his hypertension with appropriate response.  He had no further chest pain or shortness of breath after admission  to the hospital. An echocardiogram was obtained that showed an ejection fraction of 60 to 65%.  There is no regional wall abnormalities.  RV function was normal.  There was no significant valvular abnormality. Left heart catheterization  demonstrates severe three-vessel coronary artery disease."    Cath showed: "Mid LM lesion is 30% stenosed.   RPDA lesion is 75% stenosed.   RPAV lesion is 80% stenosed.   Prox Cx to Mid Cx lesion is 90% stenosed.   Mid LAD-2 lesion is 99% stenosed.   Mid LAD-1 lesion is 50% stenosed.   Dist RCA lesion is 50% stenosed.   The left ventricular systolic function is normal.   LV end diastolic pressure is normal.   The left ventricular ejection fraction is 55-65% by visual estimate.   There is no aortic valve stenosis.   Severe three vessel CAD. Mid LAD subtotal occlusion at a large diagonal branch.  Proximal to mid circumflex lesion and distal RCA and branch vessel disease.  "  Done well since the surgery.  Rare chest tightness.  Cough at night. Starting once a day Amio today from higher doses.  One episodes of palpitations. BP at home has been well controlled. O2 sats have all been in the 90s.   Denies : exertional Chest pain. Leg edema. Nitroglycerin use. Orthopnea.  Paroxysmal nocturnal dyspnea. Shortness of breath. Syncope.    Walks regularly around his pool. Not doing cardiac  rehab.  He is very motivated to exercise.   Appetite is getting better.  Incisions healing well.  Past Medical History:  Diagnosis Date   Back pain    Hypertension    Mixed hyperlipidemia    Squamous cell carcinoma of scalp     Past Surgical History:  Procedure Laterality Date   BACK SURGERY     65 years of age   BIOPSY PROSTATE  03/2020   2nd Biopsy - Negative per patient   CATARACT EXTRACTION, BILATERAL Bilateral 2000   COLONOSCOPY  2020   2nd Colonoscopy - Performed in [place] N/A 07/31/2021   Procedure: CORONARY ARTERY BYPASS  GRAFTING (CABG)x 5 USING LIMA  AND RIGHT GSV. LIMA TO LAD, SVG TO OM, SVG TO DIAG., SVG TO PD-PL SEQUENTIALLY;  Surgeon: Melrose Nakayama, MD;  Location: [facility];  Service: Open Heart Surgery;  Laterality: N/A;   ENDARTERECTOMY  07/31/2021   Procedure: ENDARTERECTOMY CORONARY ARTERY;  Surgeon: Melrose Nakayama, MD;  Location: [facility];  Service: Open Heart Surgery;;   ENDOVEIN HARVEST OF GREATER SAPHENOUS VEIN Right 07/31/2021   Procedure: ENDOVEIN HARVEST OF GREATER SAPHENOUS VEIN;  Surgeon: Melrose Nakayama, MD;  Location: [facility] [place];  Service: Open Heart Surgery;  Laterality: Right;   LEFT HEART CATH AND CORONARY ANGIOGRAPHY N/A 07/28/2021   Procedure: LEFT HEART CATH AND CORONARY ANGIOGRAPHY;  Surgeon: Jettie Booze, MD;  Location: [facility];  Service: Cardiovascular;  Laterality: N/A;   LITHOTRIPSY  12/2019   Covid Screen prior to procedure & negative per patient   MENISCUS REPAIR Left 2010   TEE WITHOUT CARDIOVERSION N/A 07/31/2021   Procedure: TRANSESOPHAGEAL ECHOCARDIOGRAM (TEE);  Surgeon: Melrose Nakayama, MD;  Location: [facility];  Service: Open Heart Surgery;  Laterality: N/A;     Current Outpatient Medications  Medication Sig Dispense Refill   acetaminophen (TYLENOL) 325 MG tablet Take 2 tablets (650 mg total) by mouth every 6 (six) hours as needed for mild pain, headache or fever.     albuterol (VENTOLIN HFA) 108 (90 Base) MCG/ACT inhaler Inhale 1-2 puffs into the lungs See admin instructions. Inhale 1-2 puffs into the lungs every four to six hours as needed for shortness of breath or wheezing     amiodarone (PACERONE) 200 MG tablet Take 2 tablets (400 mg total) by mouth 2 (two) times daily. X 7 days, then decrease to 200 mg by mouth twice a day x 7 days, then decrease to 200 mg daily 90 tablet 1   aspirin EC 81 MG tablet Take 1 tablet (81 mg total) by mouth daily. Swallow whole. 150 tablet 2   Cholecalciferol (VITAMIN D-3) 25 MCG (1000 UT) CAPS Take 1,000 Units by  mouth daily.     CINNAMON PO Take 1,000 mg by mouth daily.     clopidogrel (PLAVIX) 75 MG tablet Take 1 tablet (75 mg total) by mouth daily. 30 tablet 3   Coenzyme Q10 100 MG capsule Take 100 mg by mouth daily.     cyanocobalamin 1000 MCG tablet Take by mouth.     magnesium 30 MG tablet Take 30 mg by mouth daily.     metoprolol tartrate (LOPRESSOR) 25 MG tablet Take 1 tablet (25 mg total) by mouth 2 (two) times daily. 60 tablet 3   POTASSIUM PO Take 1 tablet by mouth daily.     pyridoxine (B-6) 100 MG tablet Take 100 mg by mouth daily.     rosuvastatin (  CRESTOR) 20 MG tablet Take 1 tablet (20 mg total) by mouth daily. 30 tablet 3   tamsulosin (FLOMAX) 0.4 MG CAPS capsule Take 1 capsule (0.4 mg total) by mouth daily after supper. 30 capsule 3   traMADol (ULTRAM) 50 MG tablet Take 1 tablet (50 mg total) by mouth every 6 (six) hours as needed for moderate pain. 30 tablet 0   ZINC SULFATE PO Take 1 tablet by mouth daily.     No current facility-administered medications for this visit.    Allergies:   Simvastatin-high dose    Social History:  The patient  reports that he has quit smoking. He has never used smokeless tobacco. He reports that he does not drink alcohol and does not use drugs.   Family History:  The patient's family history includes Alcohol abuse in his father and mother; Aneurysm in his maternal grandfather; Cancer in his father and mother.    ROS:  Please see the history of present illness.   Otherwise, review of systems are positive for some fatigue.   All other systems are reviewed and negative.    PHYSICAL EXAM: VS:  BP 118/72   Pulse (!) 54   Ht 5\' 9"  (1.753 m)   Wt 194 lb (88 kg)   SpO2 98%   BMI 28.65 kg/m  , BMI Body mass index is 28.65 kg/m. GEN: Well nourished, well developed, in no acute distress HEENT: normal Neck: no JVD, carotid bruits, or masses Cardiac: bradycardic; no murmurs, rubs, or gallops,no edema  Respiratory:  clear to auscultation  bilaterally, normal work of breathing GI: soft, nontender, nondistended, + BS MS: no deformity or atrophy; well healing chest wall incisions, abdominal incisions Skin: warm and dry, no rash Neuro:  Strength and sensation are intact Psych: euthymic mood, full affect   EKG:   The ekg ordered today demonstrates sinus bradycardia, no ST changes   Recent Labs: 03/24/2021: ALT 33; TSH 2.410 08/01/2021: Magnesium 2.6 08/03/2021: BUN 16; Creatinine, Ser 1.05; Hemoglobin 11.5; Platelets 265; Potassium 3.8; Sodium 131   Lipid Panel    Component Value Date/Time   CHOL 217 (H) 07/27/2021 0246   CHOL 209 (H) 03/24/2021 0946   TRIG 96 07/27/2021 0246   HDL 40 (L) 07/27/2021 0246   HDL 37 (L) 03/24/2021 0946   CHOLHDL 5.4 07/27/2021 0246   VLDL 19 07/27/2021 0246   LDLCALC 158 (H) 07/27/2021 0246   LDLCALC 139 (H) 03/24/2021 2297     Other studies Reviewed: Additional studies/ records that were reviewed today with results demonstrating: labs reviewed.   ASSESSMENT AND PLAN:  CAD: No angina on medical therapy. S/p CABG. we went over lifting restrictions.  He wants to consider going back to work.  He does have the option of working remotely.  I suggested starting with 1/2-day remotely to see how he feels.  He is agreeable. Hyperlipidemia: LDL 158 in 2022.  Now on high potency rosuvastatin.  Will need labs rechecked in several months. PAF: Currently on Amiodarone post op.  Decreasing today.  Hopefully, can stop this medicine in a few weeks when he sees Dr. Roxan Hockey if he is not having palpitations.  He did feel his atrial fibrillation and racing heart while in the hospital.   Current medicines are reviewed at length with the patient today.  The patient concerns regarding his medicines were addressed.  The following changes have been made:  No change  Labs/ tests ordered today include:  No orders of the defined types were  placed in this encounter.   Recommend 150 minutes/week of  aerobic exercise Low fat, low carb, high fiber diet recommended  Disposition:   FU in 5 months   Signed, Larae Grooms, MD  08/20/2021 9:36 AM    [facility] [place], [place], [place]  [address] Phone: [phone]; Fax: [phone]

## 2021-08-20 ENCOUNTER — Ambulatory Visit (INDEPENDENT_AMBULATORY_CARE_PROVIDER_SITE_OTHER): Payer: 59 | Admitting: Interventional Cardiology

## 2021-08-20 ENCOUNTER — Other Ambulatory Visit: Payer: Self-pay

## 2021-08-20 ENCOUNTER — Telehealth: Payer: Self-pay | Admitting: Interventional Cardiology

## 2021-08-20 ENCOUNTER — Encounter: Payer: Self-pay | Admitting: Interventional Cardiology

## 2021-08-20 VITALS — BP 118/72 | HR 54 | Ht 69.0 in | Wt 194.0 lb

## 2021-08-20 DIAGNOSIS — E782 Mixed hyperlipidemia: Secondary | ICD-10-CM

## 2021-08-20 DIAGNOSIS — I25118 Atherosclerotic heart disease of native coronary artery with other forms of angina pectoris: Secondary | ICD-10-CM

## 2021-08-20 DIAGNOSIS — Z0279 Encounter for issue of other medical certificate: Secondary | ICD-10-CM

## 2021-08-20 DIAGNOSIS — I480 Paroxysmal atrial fibrillation: Secondary | ICD-10-CM

## 2021-08-20 NOTE — Patient Instructions (Signed)
Medication Instructions:  Your physician recommends that you continue on your current medications as directed. Please refer to the Current Medication list given to you today.  *If you need a refill on your cardiac medications before your next appointment, please call your pharmacy*   Lab Work: none If you have labs (blood work) drawn today and your tests are completely normal, you will receive your results only by: [place] (if you have MyChart) OR A paper copy in the mail If you have any lab test that is abnormal or we need to change your treatment, we will call you to review the results.   Testing/Procedures: none   Follow-Up: At [facility], you and your health needs are our priority.  As part of our continuing mission to provide you with exceptional heart care, we have created designated Provider Care Teams.  These Care Teams include your primary Cardiologist (physician) and Advanced Practice Providers (APPs -  Physician Assistants and Nurse Practitioners) who all work together to provide you with the care you need, when you need it.  We recommend signing up for the patient portal called "MyChart".  Sign up information is provided on this After Visit Summary.  MyChart is used to connect with patients for Virtual Visits (Telemedicine).  Patients are able to view lab/test results, encounter notes, upcoming appointments, etc.  Non-urgent messages can be sent to your provider as well.   To learn more about what you can do with MyChart, go to [email or web address].    Your next appointment:   February 2,2023 at 11:20  The format for your next appointment:   In Person  Provider:   Casandra Doffing, MD   Other Instructions

## 2021-08-20 NOTE — Telephone Encounter (Signed)
Forms from [employer] received on 08/20/2021. Completed patient auth attached. Took form to Dr. Irish Lack box for completion. JMM 08/20/2021

## 2021-08-23 ENCOUNTER — Telehealth: Payer: Self-pay | Admitting: Cardiology

## 2021-08-23 NOTE — Telephone Encounter (Signed)
Patient calling regarding multiple complaints today  First: his BP is 120/18mm hg and HR is in 40s- HR has been in low 40s for sometime now. When they went to see his Cardiologist (Dr Irish Lack) on 10/12- he was sinus brady at 54bpm.  He denies any dizziness, syncope Today he walked outside the house and came back- he noted that his HR is still in 53s He is currently on amiodarone 200mg  daily and metoprolol 25mg  bid  Second: he has chest pain on the left side, dull, constant pain, going to the back, no aggravating or relieving factors  Recc: stop amiodarone and cut back on metoprolol to 12.5mg  BID (given his HR persists in 40s without improving).  I do not want him to become severely bradycardic. Until now no signs to suggest sick sinus or he has had pauses/dizziness/syncope.  Take tylenol for MSK chest pain.  Advised them to monitor his BP, HR daily and call us if he continues to have symptoms. Patient and wife are in agreement.

## 2021-09-01 ENCOUNTER — Other Ambulatory Visit: Payer: Self-pay | Admitting: Thoracic Surgery (Cardiothoracic Vascular Surgery)

## 2021-09-01 DIAGNOSIS — Z951 Presence of aortocoronary bypass graft: Secondary | ICD-10-CM

## 2021-09-02 ENCOUNTER — Ambulatory Visit
Admission: RE | Admit: 2021-09-02 | Discharge: 2021-09-02 | Disposition: A | Discharge status: Home / Self Care | Payer: 59 | Source: Ambulatory Visit | Attending: Thoracic Surgery (Cardiothoracic Vascular Surgery) | Admitting: Thoracic Surgery (Cardiothoracic Vascular Surgery)

## 2021-09-02 ENCOUNTER — Other Ambulatory Visit: Payer: Self-pay

## 2021-09-02 ENCOUNTER — Ambulatory Visit (INDEPENDENT_AMBULATORY_CARE_PROVIDER_SITE_OTHER): Payer: Self-pay | Admitting: Physician Assistant

## 2021-09-02 ENCOUNTER — Encounter: Payer: Self-pay | Admitting: Physician Assistant

## 2021-09-02 VITALS — BP 140/68 | HR 43 | Resp 20 | Ht 69.0 in | Wt 192.0 lb

## 2021-09-02 DIAGNOSIS — Z951 Presence of aortocoronary bypass graft: Secondary | ICD-10-CM | POA: Diagnosis not present

## 2021-09-02 DIAGNOSIS — I517 Cardiomegaly: Secondary | ICD-10-CM | POA: Diagnosis not present

## 2021-09-02 DIAGNOSIS — J90 Pleural effusion, not elsewhere classified: Secondary | ICD-10-CM | POA: Diagnosis not present

## 2021-09-02 NOTE — Progress Notes (Addendum)
[name]       [place] [address]             [phone]      Alexander Quinn is a 65 y.o. male patient who underwent a CABG x 5 with Dr. Roxan Hockey. His only complication in the hospital was atrial fibrillation which he converted to NSR with the Amiodarone IV protocol. We avoided nodal agents since he had some bradycardia with syncope before surgery per Cardiology.   Overall, he is doing okay.  He does have some sinus bradycardia and his amiodarone has been discontinued.  His metoprolol was reduced to 25 mg daily.  I suggested having his metoprolol and taking 12 and half in the morning and 12 and half milligrams in the evening.  Otherwise he has been walking 2 miles a day and has not had any significant pain or shortness of breath.   No diagnosis found. Past Medical History:  Diagnosis Date   Back pain    Hypertension    Mixed hyperlipidemia    Squamous cell carcinoma of scalp    No past surgical history pertinent negatives on file. Scheduled Meds: Current Outpatient Medications on File Prior to Visit  Medication Sig Dispense Refill   acetaminophen (TYLENOL) 325 MG tablet Take 2 tablets (650 mg total) by mouth every 6 (six) hours as needed for mild pain, headache or fever.     albuterol (VENTOLIN HFA) 108 (90 Base) MCG/ACT inhaler Inhale 1-2 puffs into the lungs See admin instructions. Inhale 1-2 puffs into the lungs every four to six hours as needed for shortness of breath or wheezing     amiodarone (PACERONE) 200 MG tablet Take 2 tablets (400 mg total) by mouth 2 (two) times daily. X 7 days, then decrease to 200 mg by mouth twice a day x 7 days, then decrease to 200 mg daily 90 tablet 1   aspirin EC 81 MG tablet Take 1 tablet (81 mg total) by mouth daily. Swallow whole. 150 tablet 2   Cholecalciferol (VITAMIN D-3) 25 MCG (1000 UT) CAPS Take 1,000 Units by mouth daily.     CINNAMON PO Take 1,000 mg by mouth daily.     clopidogrel (PLAVIX) 75 MG tablet  Take 1 tablet (75 mg total) by mouth daily. 30 tablet 3   Coenzyme Q10 100 MG capsule Take 100 mg by mouth daily.     cyanocobalamin 1000 MCG tablet Take by mouth.     magnesium 30 MG tablet Take 30 mg by mouth daily.     metoprolol tartrate (LOPRESSOR) 25 MG tablet Take 1 tablet (25 mg total) by mouth 2 (two) times daily. 60 tablet 3   POTASSIUM PO Take 1 tablet by mouth daily.     pyridoxine (B-6) 100 MG tablet Take 100 mg by mouth daily.     rosuvastatin (CRESTOR) 20 MG tablet Take 1 tablet (20 mg total) by mouth daily. 30 tablet 3   tamsulosin (FLOMAX) 0.4 MG CAPS capsule Take 1 capsule (0.4 mg total) by mouth daily after supper. 30 capsule 3   traMADol (ULTRAM) 50 MG tablet Take 1 tablet (50 mg total) by mouth every 6 (six) hours as needed for moderate pain. 30 tablet 0   ZINC SULFATE PO Take 1 tablet by mouth daily.     No current facility-administered medications on file prior to visit.     Allergies  Allergen Reactions   Simvastatin-High Dose Other (See Comments)  Memory problems   Active Problems:   * No active hospital problems. *  There were no vitals taken for this visit. Vitals:   09/02/21 1337  BP: 140/68  Pulse: (!) 43  Resp: 20  SpO2: 96%    CLINICAL DATA:  History of CABG 3 weeks ago   EXAM: CHEST - 2 VIEW   COMPARISON:  08/04/2021   FINDINGS: Stable mild cardiomegaly. Status post CABG. Trace residual left-sided pleural effusion. Lungs are otherwise clear. No pneumothorax.   IMPRESSION: Trace residual left-sided pleural effusion.  Lungs otherwise clear.     Electronically Signed   By: Davina Poke D.O.   On: 09/02/2021 14:03  Assessment & Plan  1.  Doing well after coronary bypass grafting.  He has been ambulating 2 miles a day without any shortness of breath or chest pain.  He has no leg requiring narcotic pain medication.  He is cleared to drive today. 2.  He is not interested in a cardiac pulmonary rehab program since he is doing most of  his rehab at home at this point.  He does not feel that he needs it. 3.  Sinus bradycardia-his medications were recently adjusted by cardiology and he was told to discontinue his amiodarone.  His dose of metoprolol was cut in half however, he has been taking 25 mg in the morning of metoprolol tartrate.  I suggested taking 12 and half milligrams in the morning and 12 and half milligrams in the evening.  He also needs to keep track of his heart rate and blood pressure in a log and presented to the cardiology office. 4.  He asked about multiple different activities including golfing which I suggested waiting until the 41-month mark to participate in. 5.  His incisions are all healing well without any issue and he has no pedal edema present on exam today.  Plan: I do not think he needs to come back to our office for routine follow-up appointment.  He is always welcome to call our office if he has any questions or concerns that arise.  I would discharge him back to his primary cardiologist at this time.  Continue aspirin and Plavix.  Addendum: Left message via MyChart regarding massage after coronary bypass grafting.  I think it would be safe to wait 3 months to ensure total healing of his sternotomy incision and sternal bone for a prone massage.  Chair massage might be able to take place after the 6-week point.  [name] 09/02/2021

## 2021-09-15 ENCOUNTER — Ambulatory Visit: Payer: Self-pay | Admitting: Physician Assistant

## 2021-09-15 ENCOUNTER — Other Ambulatory Visit: Payer: Self-pay

## 2021-09-15 ENCOUNTER — Encounter: Payer: Self-pay | Admitting: Physician Assistant

## 2021-09-15 VITALS — BP 141/69 | HR 64 | Temp 97.5°F | Resp 14 | Wt 193.0 lb

## 2021-09-15 DIAGNOSIS — Z951 Presence of aortocoronary bypass graft: Secondary | ICD-10-CM

## 2021-09-15 NOTE — Progress Notes (Signed)
   Subjective: Returned to work status    Patient ID: Alexander Quinn, male    DOB: 01/22/1956, 65 y.o.   MRN: [id number]  HPI Patient is 7 weeks status post CABG and has been cleared by cardiologist for limited work.  Patient states status post procedure he is now increasing his walking to 2 miles per day.  Denies dyspnea  with the exercise.  It is recommended that he work at 20-hour workweek a job site at home.  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   Review of Systems BPH, hyperlipidemia, hypertension.    Objective:   Physical Exam  No acute distress.  Temperature 97.5, pulse 64, respiration 14, BP 141/69, and patient 96% O2 sat on room air.  Patient weighs 93 pounds BMI is 28.50. HEENT is unremarkable.  Neck is supple without lymphadenopathy or bruits.  Lungs are clear to auscultation.  Heart is regular rate and rhythm. Well-healed sternum scar.  Nontender to palpation.     Assessment & Plan: Status post CABG   Patient is amenable to recommendations of modified work schedule for the next 2 weeks.  Patient worked normally 20 hours/week.  We will follow this clinically fine difficulty maintaining the schedule.

## 2021-09-15 NOTE — Progress Notes (Signed)
S/P Bypass Surgery  AMD

## 2021-09-29 ENCOUNTER — Ambulatory Visit: Payer: Self-pay | Admitting: Physician Assistant

## 2021-09-29 ENCOUNTER — Encounter: Payer: Self-pay | Admitting: Physician Assistant

## 2021-09-29 ENCOUNTER — Other Ambulatory Visit: Payer: Self-pay

## 2021-09-29 VITALS — BP 170/90 | HR 46 | Temp 97.3°F | Resp 14 | Ht 69.0 in | Wt 189.0 lb

## 2021-09-29 DIAGNOSIS — Z23 Encounter for immunization: Secondary | ICD-10-CM

## 2021-09-29 DIAGNOSIS — Z7689 Persons encountering health services in other specified circumstances: Secondary | ICD-10-CM

## 2021-09-29 NOTE — Progress Notes (Signed)
Alexander Quinn denies pain or SOB, denies questions or concerns. He feels in good health. Initial BP 170/90 manually after ambulating upstairs. BP Rechecked 15 min after allowed to rest. Reading 164/90 LUE sitting. Patient reports eating ham and cheese deli sandwich for dinner. Education provided on avoiding salty foods such deli meats, hams, cured meats, canned foods. Read nutrition labels and avoid the aforementioned foods.[name] has sent nutrition and heart related information, Alexander Quinn will let us know if he needs more information on nutrition and salt, heart health. [name] monitors BP at home with wrist monitor and he is usually gets readings in the 110-120 ranges. He will bring the monitor with him for comparable readings. He is also agreeable to come back for BP assessment x 3 day checks Requested flu shot today. High dose flu shot administered in LUE at 9:35am without incident.

## 2021-09-29 NOTE — Progress Notes (Signed)
   Subjective: Returned to work status    Patient ID: Alexander Quinn, male    DOB: [birth year], 65 y.o.   MRN: [id number]  HPI Patient is here for evaluation to return back to full work status.  Patient is status post 9 weeks CABG and denies any discomfort.  Patient has been working 20-hour workweek for the last 2 weeks.  Patient is performing daily 2 mile walks without dyspnea.  Patient denies chest pain.   Review of Systems CABG, hyperlipidemia, and hypertension.    Objective:   Physical Exam  No acute distress.  Temperature 97.3, pulse 46, BP is 160/90, respiration 14, patient 100% O2 sat on room air.  Patient weighs 109 pounds and BMI is 27.91. HEENT is unremarkable.  Neck is supple without lymphadenopathy or bruits.  Lungs are clear to auscultation.  Heart is bradycardic.     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  Patient is amenable to a 3-day blood pressure check this week.  We will follow-up as necessary.

## 2021-09-30 ENCOUNTER — Ambulatory Visit: Payer: Self-pay

## 2021-09-30 VITALS — BP 122/69 | HR 60

## 2021-09-30 DIAGNOSIS — Z0130 Encounter for examination of blood pressure without abnormal findings: Secondary | ICD-10-CM

## 2021-09-30 NOTE — Progress Notes (Signed)
Pt presents today for BP check. 

## 2021-10-13 ENCOUNTER — Encounter (HOSPITAL_COMMUNITY): Payer: Self-pay

## 2021-10-13 ENCOUNTER — Emergency Department (HOSPITAL_COMMUNITY): Payer: 59

## 2021-10-13 ENCOUNTER — Telehealth: Payer: Self-pay | Admitting: Interventional Cardiology

## 2021-10-13 ENCOUNTER — Emergency Department (HOSPITAL_COMMUNITY)
Admission: EM | Admit: 2021-10-13 | Discharge: 2021-10-13 | Disposition: A | Discharge status: Home / Self Care | Payer: 59 | Attending: Emergency Medicine | Admitting: Emergency Medicine

## 2021-10-13 ENCOUNTER — Other Ambulatory Visit: Payer: Self-pay

## 2021-10-13 DIAGNOSIS — K449 Diaphragmatic hernia without obstruction or gangrene: Secondary | ICD-10-CM | POA: Diagnosis not present

## 2021-10-13 DIAGNOSIS — Z951 Presence of aortocoronary bypass graft: Secondary | ICD-10-CM | POA: Insufficient documentation

## 2021-10-13 DIAGNOSIS — I10 Essential (primary) hypertension: Secondary | ICD-10-CM | POA: Diagnosis not present

## 2021-10-13 DIAGNOSIS — N3289 Other specified disorders of bladder: Secondary | ICD-10-CM | POA: Diagnosis not present

## 2021-10-13 DIAGNOSIS — Z7982 Long term (current) use of aspirin: Secondary | ICD-10-CM | POA: Insufficient documentation

## 2021-10-13 DIAGNOSIS — Z87891 Personal history of nicotine dependence: Secondary | ICD-10-CM | POA: Diagnosis not present

## 2021-10-13 DIAGNOSIS — N200 Calculus of kidney: Secondary | ICD-10-CM | POA: Insufficient documentation

## 2021-10-13 DIAGNOSIS — Z85828 Personal history of other malignant neoplasm of skin: Secondary | ICD-10-CM | POA: Diagnosis not present

## 2021-10-13 DIAGNOSIS — K5730 Diverticulosis of large intestine without perforation or abscess without bleeding: Secondary | ICD-10-CM | POA: Diagnosis not present

## 2021-10-13 DIAGNOSIS — Z7902 Long term (current) use of antithrombotics/antiplatelets: Secondary | ICD-10-CM | POA: Diagnosis not present

## 2021-10-13 DIAGNOSIS — I480 Paroxysmal atrial fibrillation: Secondary | ICD-10-CM | POA: Diagnosis not present

## 2021-10-13 DIAGNOSIS — Z79899 Other long term (current) drug therapy: Secondary | ICD-10-CM | POA: Diagnosis not present

## 2021-10-13 DIAGNOSIS — R109 Unspecified abdominal pain: Secondary | ICD-10-CM | POA: Diagnosis present

## 2021-10-13 DIAGNOSIS — N132 Hydronephrosis with renal and ureteral calculous obstruction: Secondary | ICD-10-CM | POA: Diagnosis not present

## 2021-10-13 LAB — BASIC METABOLIC PANEL
Anion gap: 8 (ref 5–15)
BUN: 20 mg/dL (ref 8–23)
CO2: 21 mmol/L — ABNORMAL LOW (ref 22–32)
Calcium: 9.7 mg/dL (ref 8.9–10.3)
Chloride: 108 mmol/L (ref 98–111)
Creatinine, Ser: 1.56 mg/dL — ABNORMAL HIGH (ref 0.61–1.24)
GFR, Estimated: 48 mL/min — ABNORMAL LOW (ref >60)
Glucose, Bld: 131 mg/dL — ABNORMAL HIGH (ref 70–99)
Potassium: 4.5 mmol/L (ref 3.5–5.1)
Sodium: 137 mmol/L (ref 135–145)

## 2021-10-13 LAB — CBC WITH DIFFERENTIAL/PLATELET
Abs Immature Granulocytes: 0.05 10*3/uL (ref 0.00–0.07)
Basophils Absolute: 0 10*3/uL (ref 0.0–0.1)
Basophils Relative: 0 %
Eosinophils Absolute: 0 10*3/uL (ref 0.0–0.5)
Eosinophils Relative: 0 %
HCT: 47.7 % (ref 39.0–52.0)
Hemoglobin: 14.4 g/dL (ref 13.0–17.0)
Immature Granulocytes: 0 %
Lymphocytes Relative: 7 %
Lymphs Abs: 0.8 10*3/uL (ref 0.7–4.0)
MCH: 25.4 pg — ABNORMAL LOW (ref 26.0–34.0)
MCHC: 30.2 g/dL (ref 30.0–36.0)
MCV: 84.3 fL (ref 80.0–100.0)
Monocytes Absolute: 0.6 10*3/uL (ref 0.1–1.0)
Monocytes Relative: 5 %
Neutro Abs: 10.9 10*3/uL — ABNORMAL HIGH (ref 1.7–7.7)
Neutrophils Relative %: 88 %
Platelets: 322 10*3/uL (ref 150–400)
RBC: 5.66 MIL/uL (ref 4.22–5.81)
RDW: 13.4 % (ref 11.5–15.5)
WBC: 12.4 10*3/uL — ABNORMAL HIGH (ref 4.0–10.5)
nRBC: 0 % (ref 0.0–0.2)

## 2021-10-13 LAB — URINALYSIS, ROUTINE W REFLEX MICROSCOPIC
Bacteria, UA: NONE SEEN
Bilirubin Urine: NEGATIVE
Glucose, UA: NEGATIVE mg/dL
Ketones, ur: 20 mg/dL — AB
Leukocytes,Ua: NEGATIVE
Nitrite: NEGATIVE
Protein, ur: 30 mg/dL — AB
RBC / HPF: >50 RBC/hpf — ABNORMAL HIGH (ref 0–5)
Specific Gravity, Urine: 1.014 (ref 1.005–1.030)
pH: 6 (ref 5.0–8.0)

## 2021-10-13 MED ORDER — KETOROLAC TROMETHAMINE 30 MG/ML IJ SOLN
15.0000 mg | Freq: Once | INTRAMUSCULAR | Status: AC
  Administered 2021-10-13: 15 mg via INTRAVENOUS
  Filled 2021-10-13: qty 1

## 2021-10-13 MED ORDER — OXYCODONE-ACETAMINOPHEN 5-325 MG PO TABS: 1.0000 | ORAL_TABLET | Freq: Four times a day (QID) | ORAL | 0 refills | Status: DC | PRN

## 2021-10-13 MED ORDER — ONDANSETRON 4 MG PO TBDP: 4.0000 mg | ORAL_TABLET | Freq: Three times a day (TID) | ORAL | 0 refills | Status: DC | PRN

## 2021-10-13 MED ORDER — ONDANSETRON 4 MG PO TBDP
8.0000 mg | ORAL_TABLET | Freq: Once | ORAL | Status: AC
  Administered 2021-10-13: 8 mg via ORAL
  Filled 2021-10-13: qty 2

## 2021-10-13 MED ORDER — HYDROMORPHONE HCL 1 MG/ML IJ SOLN
0.5000 mg | Freq: Once | INTRAMUSCULAR | Status: AC
  Administered 2021-10-13: 0.5 mg via INTRAVENOUS
  Filled 2021-10-13: qty 1

## 2021-10-13 MED ORDER — OXYCODONE-ACETAMINOPHEN 5-325 MG PO TABS
1.0000 | ORAL_TABLET | Freq: Once | ORAL | Status: AC
  Administered 2021-10-13: 1 via ORAL
  Filled 2021-10-13: qty 1

## 2021-10-13 NOTE — ED Triage Notes (Signed)
Patient complains of right flank pain and thinks another kidney stone and hx of same. Reports pain started Saturday and describes as intermittent.

## 2021-10-13 NOTE — Telephone Encounter (Signed)
   Pt's wife called, she said pt been having pain on his lower right abdomen. They suspect pt is having kidney stone. They're thinking twice going to the hospital because they might not give anything to the pt and asked if Dr. Irish Lack can prescribed anything to help him with the pain

## 2021-10-13 NOTE — Telephone Encounter (Signed)
Pts wife called to report that the pt is at the [facility] ED and had a CT confirming he has kidney stones and he is in a lot of pain and she is worried it will take a few more hours until he is seen. I have urged her and him to stay at the ED and not to sign out without fully being assessed and treated... she agrees.   She will also have him sign a DPR to have her name noted for further cardiology communications.

## 2021-10-13 NOTE — Telephone Encounter (Signed)
Pt's wife is returning call regarding getting pain meds for pt due to him having kidney stones. Pt does see a Urologist  [phone]

## 2021-10-13 NOTE — Telephone Encounter (Signed)
Pt returning phone call please advise...Marland KitchenMarland Kitchen call [phone]

## 2021-10-13 NOTE — ED Notes (Signed)
Pt family member very upset, stating "this is the worst hospital experience I have had." Pt however was calm and relaxed. Pt wife stated he needs pain medication and no one is giving it to him. This RN offered to assist in making that happen. This RN attempted to explain why delays in care happen especially when the RN assigned to pt is in another room with a critical pt. Wife stated she did not care about how critical other pts are, that's not her problem and her husband is in severe pain and somebody needs to do something about it right now!. Wife would not listen to RN. Wife then demanded to speak to the dr. Dr. Gilford Raid notified and went to bedside.  Wife later requested pt experience contact information. This RN personally gave her the phone number, provided pt discharge paperwork, skid resistant socks and wheelchair.  IV removed. Bleeding controlled. Wife went to get car. This RN wheeled pt to lobby to wait for wife. Pt stated , Thank you liz you were great and I am sorry about my wife.

## 2021-10-13 NOTE — ED Provider Notes (Signed)
Emergency Medicine Provider Triage Evaluation Note  Alexander Quinn , a 65 y.o. male  was evaluated in triage.  Pt complains of right flank pain that starts about Saturday.  Says the pains been constant.  He also has right lower quadrant abdominal pain.  He has had no fevers but has had chills.  He has had difficulty urinating without much coming out.  He said that he had some hematuria that resolved yesterday.  Denies dysuria.  History of kidney stones and lithotripsy.  Last stone was about 1 year ago. He is on blood thinners.  Review of Systems  Positive:  Negative:   Physical Exam  BP (!) 204/82   Pulse (!) 50   Temp 97.9 F (36.6 C) (Oral)   Resp 18   SpO2 100%  Gen:   Awake, no distress   Resp:  Normal effort  MSK:   Moves extremities without difficulty  Other:    Medical Decision Making  Medically screening exam initiated at 11:07 AM.  Appropriate orders placed.  Alexander Quinn was informed that the remainder of the evaluation will be completed by another provider, this initial triage assessment does not replace that evaluation, and the importance of remaining in the ED until their evaluation is complete.     Sheila Oats 10/13/21 1108    Davonna Belling, MD 10/14/21 1235

## 2021-10-13 NOTE — Telephone Encounter (Signed)
Left the pt a message to call the office back. Cannot speak with the pts wife without verbal consent, for there is no DPR on file.

## 2021-10-13 NOTE — ED Provider Notes (Signed)
Pt was signed out by Dr. Alvino Chapel pending symptomatic improvement.  Pt's pain meds were delayed as his nurse was in with a dying septic patient and then with a level 2 trauma.  His wife was quite irate and frustrated with the delay in his pain meds.  She said she does "not care how busy or sick other people are."  The patient; however, is very nice and said he does feel better after the pain meds.  He wants to go home.  Pt is stable for d/c.  He is to f/u with urology.  Return if worse.    Isla Pence, MD 10/13/21 [phone]

## 2021-10-13 NOTE — ED Triage Notes (Signed)
Pt. Requesting more pain medication, Given Percocet at 1118

## 2021-10-13 NOTE — ED Provider Notes (Signed)
[facility] EMERGENCY DEPARTMENT Provider Note   CSN: [id number] Arrival date & time: 10/13/21  1004     History No chief complaint on file.   Alexander Quinn is a 65 y.o. male.  HPI Patient presents with right flank pain.  Has had for around 2 days.  Severe.  Feels like previous kidney stones.  No fevers.  Did have some nausea.  Around 3 months ago had a 5 vessel CABG.  States he found a little blood in the urine.  No fevers.    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 LIMA TO LAD, SVG TO OM, SVG TO DIAG., SVG TO PD-PL SEQUENTIALLY;  Surgeon: Melrose Nakayama, MD;  Location: [facility];  Service: Open Heart Surgery;  Laterality: N/A;   ENDARTERECTOMY  07/31/2021   Procedure: ENDARTERECTOMY CORONARY ARTERY;  Surgeon: Melrose Nakayama, MD;  Location: [facility];  Service: Open Heart Surgery;;   ENDOVEIN HARVEST OF GREATER SAPHENOUS VEIN Right 07/31/2021   Procedure: ENDOVEIN HARVEST OF GREATER SAPHENOUS VEIN;  Surgeon: Melrose Nakayama, MD;  Location: [facility];  Service: Open Heart Surgery;  Laterality: Right;   LEFT HEART CATH AND CORONARY ANGIOGRAPHY N/A 07/28/2021   Procedure: LEFT HEART CATH AND CORONARY ANGIOGRAPHY;  Surgeon: Jettie Booze, MD;  Location: [facility];  Service: Cardiovascular;  Laterality: N/A;   LITHOTRIPSY  12/2019   Covid Screen prior to procedure & negative per patient   MENISCUS REPAIR Left 2010   TEE WITHOUT CARDIOVERSION N/A 07/31/2021   Procedure: TRANSESOPHAGEAL ECHOCARDIOGRAM (TEE);  Surgeon: Melrose Nakayama, MD;  Location: [place];  Service: Open Heart Surgery;  Laterality: N/A;       Family History  Problem Relation Age of Onset   Cancer Mother    Alcohol abuse Mother    Alcohol abuse Father    Cancer Father    Aneurysm Maternal Grandfather     Social History   Tobacco Use   Smoking status: Former   Smokeless tobacco: Never  Substance Use Topics   Alcohol use: Never   Drug use: Never    Home Medications Prior to Admission medications   Medication Sig Start Date End Date Taking? Authorizing Provider  acetaminophen (TYLENOL) 325 MG tablet Take 2 tablets (650 mg total) by mouth every 6 (six) hours as needed for mild pain, headache or fever. 08/04/21  Yes Barrett, Erin R, PA-C  albuterol (VENTOLIN HFA) 108 (90 Base) MCG/ACT inhaler Inhale 1-2 puffs into the lungs every 6 (six) hours as needed for shortness of breath. 12/16/16  Yes [provider]  aspirin EC 81 MG tablet Take 1  tablet (81 mg total) by mouth daily. Swallow whole. 08/04/21 08/04/22 Yes Barrett, Lodema Hong, PA-C  Cholecalciferol (VITAMIN D-3) 25 MCG (1000 UT) CAPS Take 1,000 Units by mouth daily.   Yes [provider]  CINNAMON PO Take 1,000 mg by mouth daily. 05/04/19  Yes [provider]  clopidogrel (PLAVIX) 75 MG tablet Take 1 tablet (75 mg total) by mouth daily. 08/05/21  Yes Barrett, Erin R, PA-C  Coenzyme Q10 100 MG capsule Take 100 mg by mouth daily.   Yes [provider]  cyanocobalamin 1000 MCG tablet Take 1,000 mcg by mouth daily.   Yes [provider]  magnesium 30 MG tablet Take 30 mg by mouth daily.   Yes [provider]  metoprolol tartrate  (LOPRESSOR) 25 MG tablet Take 1 tablet (25 mg total) by mouth 2 (two) times daily. Patient taking differently: Take 12.5 mg by mouth daily. 08/04/21  Yes Barrett, Erin R, PA-C  POTASSIUM PO Take 1 tablet by mouth daily.   Yes [provider]  pyridoxine (B-6) 100 MG tablet Take 100 mg by mouth daily. 05/04/19  Yes [provider]  rosuvastatin (CRESTOR) 20 MG tablet Take 1 tablet (20 mg total) by mouth daily. 08/05/21  Yes Barrett, Erin R, PA-C  tamsulosin (FLOMAX) 0.4 MG CAPS capsule Take 1 capsule (0.4 mg total) by mouth daily after supper. 08/04/21  Yes Barrett, Erin R, PA-C  ZINC SULFATE PO Take 1 tablet by mouth daily.   Yes [provider]  traMADol (ULTRAM) 50 MG tablet Take 1 tablet (50 mg total) by mouth every 6 (six) hours as needed for moderate pain. Patient not taking: Reported on 10/13/2021 08/04/21   Barrett, Lodema Hong, PA-C    Allergies    Simvastatin-high dose  Review of Systems   Review of Systems  Constitutional:  Negative for appetite change.  HENT:  Negative for congestion.   Respiratory:  Negative for shortness of breath.   Gastrointestinal:  Positive for abdominal pain.  Genitourinary:  Positive for flank pain and hematuria.  Musculoskeletal:  Negative for back pain.  Skin:  Negative for rash.  Neurological:  Negative for numbness.  Psychiatric/Behavioral:  Negative for confusion.    Physical Exam Updated Vital Signs BP (!) 217/92 (BP Location: Right Arm)   Pulse 60   Temp 97.9 F (36.6 C) (Oral)   Resp 15   SpO2 99%   Physical Exam Vitals and nursing note reviewed.  HENT:     Head: Atraumatic.  Eyes:     Pupils: Pupils are equal, round, and reactive to light.  Cardiovascular:     Rate and Rhythm: Regular rhythm.  Abdominal:     Tenderness: There is abdominal tenderness.     Comments: Mild right lower quadrant tenderness.  No rebound or guarding.  No hernia palpated.  Musculoskeletal:        General: No tenderness.  Skin:     General: Skin is warm.     Capillary Refill: Capillary refill takes less than 2 seconds.  Neurological:     Mental Status: He is alert and oriented to person, place, and time.    ED Results / Procedures / Treatments   Labs (all labs ordered are listed, but only abnormal results are displayed) Labs Reviewed  BASIC METABOLIC PANEL - Abnormal; Notable for the following components:      Result Value   CO2 21 (*)    Glucose, Bld 131 (*)    Creatinine, Ser 1.56 (*)  GFR, Estimated 48 (*)    All other components within normal limits  URINALYSIS, ROUTINE W REFLEX MICROSCOPIC - Abnormal; Notable for the following components:   Hgb urine dipstick LARGE (*)    Ketones, ur 20 (*)    Protein, ur 30 (*)    RBC / HPF >50 (*)    All other components within normal limits  CBC WITH DIFFERENTIAL/PLATELET - Abnormal; Notable for the following components:   WBC 12.4 (*)    MCH 25.4 (*)    Neutro Abs 10.9 (*)    All other components within normal limits    EKG None  Radiology CT RENAL STONE STUDY  Result Date: 10/13/2021 CLINICAL DATA:  Right flank pain EXAM: CT ABDOMEN AND PELVIS WITHOUT CONTRAST TECHNIQUE: Multidetector CT imaging of the abdomen and pelvis was performed following the standard protocol without IV contrast. COMPARISON:  None. FINDINGS: Lower chest: No acute abnormality. Hepatobiliary: No focal liver abnormality is seen. No gallstones, gallbladder wall thickening, or biliary dilatation. Pancreas: Unremarkable. Spleen: Unremarkable. Adrenals/Urinary Tract: Adrenals are unremarkable. Too small to characterize low-density lesion of the upper pole the left kidney. There are multiple nonobstructing renal calculi bilaterally measuring up to 3 mm. There is right perinephric edema and hydronephrosis. The right ureter is dilated to the level of an obstructing calculus distally measuring 4 mm. Partially distended bladder is unremarkable apart from mild wall thickening that may be due to  underdistention or chronic obstruction from enlarged prostate. Stomach/Bowel: Stomach is within normal limits apart from a small hiatal hernia. Bowel is normal in caliber. There is colonic diverticulosis. Vascular/Lymphatic: Calcified plaque is present. No enlarged lymph nodes. Reproductive: Enlarged prostate. Other: Abdominal wall is unremarkable. Musculoskeletal: Degenerative changes of the included spine. IMPRESSION: Obstructing 4 mm calculus of the distal right ureter with proximal hydroureteronephrosis. Small bilateral nonobstructing renal calculi. Bladder wall thickening may reflect underdistention or chronic outlet obstruction from enlarged prostate. Colonic diverticulosis.  Small hiatal hernia. Aortic Atherosclerosis (ICD10-I70.0). Electronically Signed   By: Macy Mis M.D.   On: 10/13/2021 11:50    Procedures Procedures   Medications Ordered in ED Medications  ketorolac (TORADOL) 30 MG/ML injection 15 mg (has no administration in time range)  HYDROmorphone (DILAUDID) injection 0.5 mg (has no administration in time range)  ondansetron (ZOFRAN-ODT) disintegrating tablet 8 mg (8 mg Oral Given 10/13/21 1118)  oxyCODONE-acetaminophen (PERCOCET/ROXICET) 5-325 MG per tablet 1 tablet (1 tablet Oral Given 10/13/21 1118)    ED Course  I have reviewed the triage vital signs and the nursing notes.  Pertinent labs & imaging results that were available during my care of the patient were reviewed by me and considered in my medical decision making (see chart for details).    MDM Rules/Calculators/A&P                           Patient with hematuria and flank pain.  Found to have kidney stone on CT scan.  4 mm and rather distal.  Creatinine only slightly elevated.  Felt somewhat better with Percocet given to triage.  Will order Toradol and half milligram Dilaudid here.  Pain controlled likely to be discharged home.  Does have hypertension.  Could be related to pain.  Does have recent CABG however.   Care turned over to [facility] Final Clinical Impression(s) / ED Diagnoses Final diagnoses:  Kidney stone    Rx / DC Orders ED Discharge Orders     None  Davonna Belling, MD 10/13/21 [phone]

## 2021-10-14 ENCOUNTER — Ambulatory Visit: Payer: Self-pay | Admitting: Physician Assistant

## 2021-10-14 DIAGNOSIS — N200 Calculus of kidney: Secondary | ICD-10-CM

## 2021-10-14 MED ORDER — KETOROLAC TROMETHAMINE 10 MG PO TABS: 10.0000 mg | ORAL_TABLET | Freq: Four times a day (QID) | ORAL | 0 refills | Status: DC | PRN

## 2021-10-14 NOTE — Progress Notes (Signed)
   Subjective: Flank pain with kidney stone    Patient ID: Alexander Quinn, male    DOB: [birth year], 65 y.o.   MRN: [id number]  HPI This is a telephonic follow-up for patient who was diagnosed with 4 mm kidney stone yesterday at the [facility].  Patient states pain is controlled with oxycodone.  Patient states since he had previous history of kidney stones he has a urine strainer and will strain his urine for the stone passage.  Patient requesting work note secondary inability to perform secondary to drowsy effects of medication.   Review of Systems     Objective:   Physical Exam This is a virtual visit.       Assessment & Plan: Flank pain secondary   Advised patient to continue discharge care instruction for emergency room.  Continue oxycodone as needed and a prescription for Toradol will be sent to the pharmacy.  Patient advised to follow-up telephonically in 2 days.

## 2021-10-16 ENCOUNTER — Encounter: Payer: Self-pay | Admitting: Physician Assistant

## 2021-10-16 ENCOUNTER — Ambulatory Visit: Payer: Self-pay | Admitting: Physician Assistant

## 2021-10-16 ENCOUNTER — Other Ambulatory Visit: Payer: Self-pay

## 2021-10-16 VITALS — BP 123/63 | HR 53 | Temp 97.7°F | Resp 12 | Ht 69.0 in | Wt 195.0 lb

## 2021-10-16 DIAGNOSIS — N200 Calculus of kidney: Secondary | ICD-10-CM

## 2021-10-16 NOTE — Progress Notes (Signed)
   Subjective: Kidney stone    Patient ID: Alexander Quinn, male    DOB: [birth year], 65 y.o.   MRN: [id number]  HPI Patient follow-up for kidney stones diagnosed 2 days ago at the [facility].  Patient stated only mild discomfort.  Patient has been straining his urine but has not noticed passage of a stone which was approximately 4 mm.  Patient is just returned back to work.  Review of Systems Hypertension and PAF.    Objective:   Physical Exam  See nursing notes for vital signs. Patient appears no acute distress.  Physical exam was deferred.     Assessment & Plan: Right kidney stone.   Patient states has no need for pain medications at this time.  He will continue to strain his urine.  Patient may return back to full duties tomorrow.

## 2021-10-16 NOTE — Progress Notes (Signed)
States he hasn't passed the stone  This is the 16th time  Sees urologist  AMD

## 2021-10-18 ENCOUNTER — Other Ambulatory Visit: Payer: Self-pay | Admitting: Physician Assistant

## 2021-10-28 ENCOUNTER — Other Ambulatory Visit: Payer: Self-pay | Admitting: Physician Assistant

## 2021-10-30 DIAGNOSIS — L570 Actinic keratosis: Secondary | ICD-10-CM | POA: Diagnosis not present

## 2021-10-30 DIAGNOSIS — L821 Other seborrheic keratosis: Secondary | ICD-10-CM | POA: Diagnosis not present

## 2021-10-30 DIAGNOSIS — Z859 Personal history of malignant neoplasm, unspecified: Secondary | ICD-10-CM | POA: Diagnosis not present

## 2021-10-30 DIAGNOSIS — Z872 Personal history of diseases of the skin and subcutaneous tissue: Secondary | ICD-10-CM | POA: Diagnosis not present

## 2021-10-30 DIAGNOSIS — L578 Other skin changes due to chronic exposure to nonionizing radiation: Secondary | ICD-10-CM | POA: Diagnosis not present

## 2021-11-03 ENCOUNTER — Other Ambulatory Visit: Payer: Self-pay | Admitting: Physician Assistant

## 2021-11-13 ENCOUNTER — Telehealth: Payer: Self-pay | Admitting: Interventional Cardiology

## 2021-11-13 MED ORDER — ROSUVASTATIN CALCIUM 20 MG PO TABS: 20.0000 mg | ORAL_TABLET | Freq: Every day | ORAL | 9 refills | Status: DC

## 2021-11-13 MED ORDER — CLOPIDOGREL BISULFATE 75 MG PO TABS: 75.0000 mg | ORAL_TABLET | Freq: Every day | ORAL | 9 refills | Status: DC

## 2021-11-13 MED ORDER — METOPROLOL TARTRATE 25 MG PO TABS: 25.0000 mg | ORAL_TABLET | Freq: Two times a day (BID) | ORAL | 9 refills | Status: DC

## 2021-11-13 NOTE — Telephone Encounter (Signed)
Called pt and left message informing pt that we were sending in his medications rosuvastatin, metoprolol tartrate and clopidogrel and that he needed to reach out to his PCP for a refill on his tamsulosin, because it was not a cardiac medication. I advised the pt that if he has any other problems, questions or concerns, to give our office a call back. Medications sent to pt's pharmacy as requested. Confirmation received.

## 2021-11-13 NOTE — Telephone Encounter (Signed)
*  STAT* If patient is at the pharmacy, call can be transferred to refill team.   1. Which medications need to be refilled? (please list name of each medication and dose if known)  rosuvastatin (CRESTOR) 20 MG tablet tamsulosin (FLOMAX) 0.4 MG CAPS capsule metoprolol tartrate (LOPRESSOR) 12.5 MG tablet clopidogrel (PLAVIX) 75 MG tablet  2. Which pharmacy/location (including street and city if local pharmacy) is medication to be sent to? [id number] - [place], [place]  3. Do they need a 30 day or 90 day supply?   30 day supply  Patient's wife states the patient has been taking Metoprolol 12.5 MG twice daily instead of 25 MG. She would like to know if a 12.5 MG tablet can be ordered because the 25 MG is hard to split. Please assist.

## 2021-11-29 ENCOUNTER — Other Ambulatory Visit: Payer: Self-pay | Admitting: Physician Assistant

## 2021-12-01 ENCOUNTER — Telehealth: Payer: Self-pay | Admitting: Interventional Cardiology

## 2021-12-01 ENCOUNTER — Other Ambulatory Visit: Payer: Self-pay

## 2021-12-01 NOTE — Telephone Encounter (Signed)
Created in error

## 2021-12-02 ENCOUNTER — Other Ambulatory Visit: Payer: Self-pay

## 2021-12-02 ENCOUNTER — Ambulatory Visit: Payer: Self-pay | Admitting: Physician Assistant

## 2021-12-02 ENCOUNTER — Encounter: Payer: Self-pay | Admitting: Physician Assistant

## 2021-12-02 VITALS — BP 170/78 | HR 43 | Temp 97.6°F | Resp 14 | Ht 69.0 in | Wt 188.1 lb

## 2021-12-02 DIAGNOSIS — N200 Calculus of kidney: Secondary | ICD-10-CM

## 2021-12-02 LAB — POCT URINALYSIS DIPSTICK
Appearance: NORMAL
Bilirubin, UA: NEGATIVE
Blood, UA: NEGATIVE
Glucose, UA: NEGATIVE
Ketones, UA: NEGATIVE
Leukocytes, UA: NEGATIVE
Nitrite, UA: NEGATIVE
Protein, UA: NEGATIVE
Spec Grav, UA: 1.02 (ref 1.010–1.025)
Urobilinogen, UA: 0.2 E.U./dL
pH, UA: 6 (ref 5.0–8.0)

## 2021-12-02 MED ORDER — TAMSULOSIN HCL 0.4 MG PO CAPS: 0.4000 mg | ORAL_CAPSULE | Freq: Every day | ORAL | 3 refills | Status: DC

## 2021-12-02 NOTE — Progress Notes (Addendum)
° °  Subjective: Medication refill    Patient ID: Alexander Quinn, male    DOB: Apr 21, 1956, 66 y.o.   MRN: [id number]  HPI Patient is here today for medication refill of Flomax.  Patient recently had diagnosis of kidney stones emergency room 10/13/2021.   Review of Systems Hyperlipidemia, hypertension, and PAF    Objective:   Physical Exam  No acute distress.  Temperature 97.6, pulse 43, BP is 170 78, patient 1% O2 sat on room air.  Patient with 180 pounds and BMI is 27.70. HEENT is unremarkable.  Neck is supple lymphadenopathy or bruits.  Auscultation.  Heart is bradycardic Abdomen is negative HSM, normoactive bowel sounds, soft, and nontender to palpation. Urinalysis unremarkable.      Assessment & Plan: Medication refill.   Flomax refilled.  Advised follow-up as needed.

## 2021-12-02 NOTE — Progress Notes (Signed)
Pt presents today for flomax refill./CL,RMA

## 2021-12-11 ENCOUNTER — Ambulatory Visit: Payer: 59 | Admitting: Interventional Cardiology

## 2022-01-25 ENCOUNTER — Other Ambulatory Visit: Payer: Self-pay | Admitting: Physician Assistant

## 2022-01-25 NOTE — Progress Notes (Signed)
?  ?Cardiology Office Note ? ? ?Date:  01/27/2022  ? ?ID:  Alexander Quinn, DOB [birth year], MRN [id number] ? ?PCP:  Pcp, No  ? ? ?Chief Complaint  ?Patient presents with  ? Follow-up  ?  Discuss crestor   ? ?CAD ? ?Wt Readings from Last 3 Encounters:  ?01/27/22 195 lb (88.5 kg)  ?12/02/21 188 lb 1.6 oz (85.3 kg)  ?10/16/21 195 lb (88.5 kg)  ?  ? ?  ?History of Present Illness: ?Alexander Quinn is a 66 y.o. male   with past history of CAD.  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  He has past medical history significant for hypertension, dyslipidemia, obesity, and suspected coronary artery disease.  He has a 12-pack-year smoking history but has not smoked since 1981.  He was apparently having some cardiac symptoms last year and underwent a Cardiolite stress test by [facility] health care.  This demonstrated mild to moderate lateral wall ischemia.  He did not follow through with any further work-up preferring to get a second opinion but has not seen any other healthcare provider for management of his suspected coronary disease in the interim.  He presented to the emergency room at [facility] by way of EMS on 07/26/2021 after having a syncopal episode while playing basketball at the [facility].  This episode apparently lasted less than a minute and was accompanied by shortness of breath.  In the emergency room, he was noted to have a blood pressure of 190/94.  EKG showed normal sinus rhythm with no acute ischemic changes.  Chest x-ray was unremarkable.  Initial troponin was 44 and serial levels rose slightly to 55 in 66.  Given his previous positive Cardiolite stress test, decision was made to admit to the hospital for further evaluation and management.  He was seen by cardiology service.  He was started on aspirin, heparin infusion, and rosuvastatin.  He was also started on Avapro for his hypertension with appropriate response.  He had no further chest pain or  shortness of breath after admission to the hospital. An echocardiogram was obtained that showed an ejection fraction of 60 to 65%.  There is no regional wall abnormalities.  RV function was normal.  There was no significant valvular abnormality. Left heart catheterization  demonstrates severe three-vessel coronary artery disease."   ?  ?Cath showed: ?"Mid LM lesion is 30% stenosed. ?  RPDA lesion is 75% stenosed. ?  RPAV lesion is 80% stenosed. ?  Prox Cx to Mid Cx lesion is 90% stenosed. ?  Mid LAD-2 lesion is 99% stenosed. ?  Mid LAD-1 lesion is 50% stenosed. ?  Dist RCA lesion is 50% stenosed. ?  The left ventricular systolic function is normal. ?  LV end diastolic pressure is normal. ?  The left ventricular ejection fraction is 55-65% by visual estimate. ?  There is no aortic valve stenosis. ?  ?Severe three vessel CAD. Mid LAD subtotal occlusion at a large diagonal branch.  Proximal to mid circumflex lesion and distal RCA and branch vessel disease.  " ?  ?Done well since the surgery in 07/2021. Amiodarone was decreased. ? ?Denies : Chest pain. Dizziness. Leg edema. Nitroglycerin use. Orthopnea. Palpitations. Paroxysmal nocturnal dyspnea. Shortness of breath. Syncope.   ? ?Walks at a fast pace nearly daily for 30 minutes.  > 3 mph. ? ?Played golf yesterday. ? ?Wife with AFib and on Amiodarone. ? ? ?Past Medical  History:  ?Diagnosis Date  ? Back pain   ? Hypertension   ? Mixed hyperlipidemia   ? Squamous cell carcinoma of scalp   ? ? ?Past Surgical History:  ?Procedure Laterality Date  ? BACK SURGERY    ? 66 years of age  ? BIOPSY PROSTATE  03/2020  ? 2nd Biopsy - Negative per patient  ? CATARACT EXTRACTION, BILATERAL Bilateral 2000  ? COLONOSCOPY  2020  ? 2nd Colonoscopy - Performed in [place]  ? CORONARY ARTERY BYPASS GRAFT N/A 07/31/2021  ? Procedure: CORONARY ARTERY BYPASS GRAFTING (CABG)x 5 USING LIMA  AND RIGHT GSV. LIMA TO LAD, SVG TO OM, SVG TO DIAG., SVG TO PD-PL SEQUENTIALLY;  Surgeon: Melrose Nakayama, MD;  Location: [facility];  Service: Open Heart Surgery;  Laterality: N/A;  ? ENDARTERECTOMY  07/31/2021  ? Procedure: ENDARTERECTOMY CORONARY ARTERY;  Surgeon: Melrose Nakayama, MD;  Location: [facility] OR;  Service: Open Heart Surgery;;  ? ENDOVEIN HARVEST OF GREATER SAPHENOUS VEIN Right 07/31/2021  ? Procedure: ENDOVEIN HARVEST OF GREATER SAPHENOUS VEIN;  Surgeon: Melrose Nakayama, MD;  Location: [place];  Service: Open Heart Surgery;  Laterality: Right;  ? LEFT HEART CATH AND CORONARY ANGIOGRAPHY N/A 07/28/2021  ? Procedure: LEFT HEART CATH AND CORONARY ANGIOGRAPHY;  Surgeon: Jettie Booze, MD;  Location: [place] [facility];  Service: Cardiovascular;  Laterality: N/A;  ? LITHOTRIPSY  12/2019  ? Covid Screen prior to procedure & negative per patient  ? MENISCUS REPAIR Left 2010  ? TEE WITHOUT CARDIOVERSION N/A 07/31/2021  ? Procedure: TRANSESOPHAGEAL ECHOCARDIOGRAM (TEE);  Surgeon: Melrose Nakayama, MD;  Location: [place];  Service: Open Heart Surgery;  Laterality: N/A;  ? ? ? ?Current Outpatient Medications  ?Medication Sig Dispense Refill  ? acetaminophen (TYLENOL) 325 MG tablet Take 2 tablets (650 mg total) by mouth every 6 (six) hours as needed for mild pain, headache or fever.    ? albuterol (VENTOLIN HFA) 108 (90 Base) MCG/ACT inhaler Inhale 1-2 puffs into the lungs every 6 (six) hours as needed for shortness of breath.    ? aspirin EC 81 MG tablet Take 1 tablet (81 mg total) by mouth daily. Swallow whole. 150 tablet 2  ? Cholecalciferol (VITAMIN D-3) 25 MCG (1000 UT) CAPS Take 1,000 Units by mouth daily.    ? CINNAMON PO Take 1,000 mg by mouth daily.    ? clopidogrel (PLAVIX) 75 MG tablet Take 1 tablet (75 mg total) by mouth daily. 30 tablet 9  ? Coenzyme Q10 100 MG capsule Take 100 mg by mouth daily.    ? cyanocobalamin 1000 MCG tablet Take 1,000 mcg by mouth daily.    ? ketorolac (TORADOL) 10 MG tablet Take 1 tablet (10 mg total) by mouth every 6 (six) hours as needed. 20 tablet 0  ?  magnesium 30 MG tablet Take 30 mg by mouth daily.    ? metoprolol tartrate (LOPRESSOR) 25 MG tablet Take 12.5 mg by mouth 2 (two) times daily.    ? POTASSIUM PO Take 1 tablet by mouth daily.    ? pyridoxine (B-6) 100 MG tablet Take 100 mg by mouth daily.    ? tamsulosin (FLOMAX) 0.4 MG CAPS capsule Take 1 capsule (0.4 mg total) by mouth daily after supper. 30 capsule 3  ? ZINC SULFATE PO Take 1 tablet by mouth daily.    ? ?No current facility-administered medications for this visit.  ? ? ?Allergies:   Crestor [rosuvastatin] and Simvastatin-high dose  ? ? ?  Social History:  The patient  reports that he has quit smoking. He has never used smokeless tobacco. He reports that he does not drink alcohol and does not use drugs.  ? ?Family History:  The patient's family history includes Alcohol abuse in his father and mother; Aneurysm in his maternal grandfather; Cancer in his father and mother.  ? ? ?ROS:  Please see the history of present illness.   Otherwise, review of systems are positive for sx with statins as noted below.   All other systems are reviewed and negative.  ? ? ?PHYSICAL EXAM: ?VS:  BP 116/64   Pulse (!) 45   Ht '5\' 9"'$  (1.753 m)   Wt 195 lb (88.5 kg)   SpO2 98%   BMI 28.80 kg/m?  , BMI Body mass index is 28.8 kg/m?. ?GEN: Well nourished, well developed, in no acute distress ?HEENT: normal ?Neck: no JVD, carotid bruits, or masses ?Cardiac: RRR; no murmurs, rubs, or gallops,no edema  ?Respiratory:  clear to auscultation bilaterally, normal work of breathing ?GI: soft, nontender, nondistended, + BS ?MS: no deformity or atrophy ?Skin: warm and dry, no rash ?Neuro:  Strength and sensation are intact ?Psych: euthymic mood, full affect ? ? ? ?Recent Labs: ?03/24/2021: ALT 33; TSH 2.410 ?08/01/2021: Magnesium 2.6 ?10/13/2021: BUN 20; Creatinine, Ser 1.56; Hemoglobin 14.4; Platelets 322; Potassium 4.5; Sodium 137  ? ?Lipid Panel ?   ?Component Value Date/Time  ? CHOL 217 (H) 07/27/2021 0246  ? CHOL 209 (H)  03/24/2021 0946  ? TRIG 96 07/27/2021 0246  ? HDL 40 (L) 07/27/2021 0246  ? HDL 37 (L) 03/24/2021 0946  ? CHOLHDL 5.4 07/27/2021 0246  ? VLDL 19 07/27/2021 0246  ? [place] 158 (H) 07/27/2021 0246  ? LDLCALC 139 (H) 05/16/2

## 2022-01-27 ENCOUNTER — Encounter: Payer: Self-pay | Admitting: Interventional Cardiology

## 2022-01-27 ENCOUNTER — Ambulatory Visit (INDEPENDENT_AMBULATORY_CARE_PROVIDER_SITE_OTHER): Payer: 59 | Admitting: Interventional Cardiology

## 2022-01-27 ENCOUNTER — Other Ambulatory Visit: Payer: Self-pay

## 2022-01-27 VITALS — BP 116/64 | HR 45 | Ht 69.0 in | Wt 195.0 lb

## 2022-01-27 DIAGNOSIS — E782 Mixed hyperlipidemia: Secondary | ICD-10-CM | POA: Diagnosis not present

## 2022-01-27 DIAGNOSIS — I25118 Atherosclerotic heart disease of native coronary artery with other forms of angina pectoris: Secondary | ICD-10-CM | POA: Diagnosis not present

## 2022-01-27 DIAGNOSIS — I480 Paroxysmal atrial fibrillation: Secondary | ICD-10-CM

## 2022-01-27 NOTE — Patient Instructions (Signed)
Medication Instructions:  ?Your physician recommends that you continue on your current medications as directed. Please refer to the Current Medication list given to you today. ? ?*If you need a refill on your cardiac medications before your next appointment, please call your pharmacy* ? ? ?Lab Work: ?none ?If you have labs (blood work) drawn today and your tests are completely normal, you will receive your results only by: ?MyChart Message (if you have MyChart) OR ?A paper copy in the mail ?If you have any lab test that is abnormal or we need to change your treatment, we will call you to review the results. ? ? ?Testing/Procedures: ?none ? ? ?Follow-Up: ?At [facility], you and your health needs are our priority.  As part of our continuing mission to provide you with exceptional heart care, we have created designated Provider Care Teams.  These Care Teams include your primary Cardiologist (physician) and Advanced Practice Providers (APPs -  Physician Assistants and Nurse Practitioners) who all work together to provide you with the care you need, when you need it. ? ?We recommend signing up for the patient portal called "MyChart".  Sign up information is provided on this After Visit Summary.  MyChart is used to connect with patients for Virtual Visits (Telemedicine).  Patients are able to view lab/test results, encounter notes, upcoming appointments, etc.  Non-urgent messages can be sent to your provider as well.   ?To learn more about what you can do with MyChart, go to [email or web address].   ? ?Your next appointment:   ?12 month(s) ? ?The format for your next appointment:   ?In Person ? ?Provider:   ?Larae Grooms, MD   ? ? ?Other Instructions ?You have been referred to see pharmacist in the lipid clinic.  Please schedule new patient appointment ? ? ?

## 2022-02-03 ENCOUNTER — Telehealth: Payer: Self-pay | Admitting: *Deleted

## 2022-02-03 DIAGNOSIS — Z006 Encounter for examination for normal comparison and control in clinical research program: Secondary | ICD-10-CM

## 2022-02-03 NOTE — Telephone Encounter (Signed)
I called patient about the Prevail study which is a new drug looking to lower LDL. I will follow with email and copy of consent. ?

## 2022-02-25 ENCOUNTER — Ambulatory Visit (INDEPENDENT_AMBULATORY_CARE_PROVIDER_SITE_OTHER): Payer: 59 | Admitting: Pharmacist

## 2022-02-25 DIAGNOSIS — E782 Mixed hyperlipidemia: Secondary | ICD-10-CM | POA: Diagnosis not present

## 2022-02-25 LAB — HEPATIC FUNCTION PANEL
ALT: 35 IU/L (ref 0–44)
AST: 30 IU/L (ref 0–40)
Albumin: 4.6 g/dL (ref 3.8–4.8)
Alkaline Phosphatase: 69 IU/L (ref 44–121)
Bilirubin Total: 0.6 mg/dL (ref 0.0–1.2)
Bilirubin, Direct: 0.14 mg/dL (ref 0.00–0.40)
Total Protein: 6.7 g/dL (ref 6.0–8.5)

## 2022-02-25 LAB — LIPID PANEL
Chol/HDL Ratio: 3 ratio (ref 0.0–5.0)
Cholesterol, Total: 128 mg/dL (ref 100–199)
HDL: 42 mg/dL (ref >39)
LDL Chol Calc (NIH): 59 mg/dL (ref 0–99)
Triglycerides: 155 mg/dL — ABNORMAL HIGH (ref 0–149)
VLDL Cholesterol Cal: 27 mg/dL (ref 5–40)

## 2022-02-25 LAB — LDL CHOLESTEROL, DIRECT: LDL Direct: 60 mg/dL (ref 0–99)

## 2022-02-25 NOTE — Progress Notes (Signed)
Patient ID: Alexander Quinn                 DOB: [birth year]                    MRN: [id number] ? ? ? ? ?HPI: ?ARLAN BIRKS is a 66 y.o. male patient referred to lipid clinic by Dr. Irish Lack. PMH is significant for CAD s/p CABGx5, HTN, paroxysmal AF, obesity and HLD. Discharged on rosuvastatin 20 mg after CABG in Sept 2022. Endorsed short-term memory loss and headaches with rosuvastatin at March 2023 clinic visit with Dr. Irish Lack and was taken off med list. Also has history of intolerance to high-dose simvastatin.  ? ?Patient presents to PharmD clinic for additional lipid management. Patient reports continuing rosuvastatin 20 mg. Previously experienced headaches and memory problems that he attributed to rosuvastatin, however his symptoms have resolved despite staying on his statin. Reports that his hamstrings and calf muscles bother him but uses roll-on biofreeze everyday which helps. Patient has an active lifestyle - walking 2-4 miles every day at a brisk pace. Doing well with diet modifications and says he feels great since his CABG last year.  ? ?Current Medications: rosuvastatin 20 mg daily ?Intolerances: high-dose simvastatin - memory problems  ?Risk Factors: CAD s/p CABGx5, HTN, obesity ?LDL goal: <55 mg/dL with extensive CV history ? ?Diet:  ?Breakfast - egg, toast, uses diet bread, uses smart butter ?Lunch - sandwich with [place] and mustard cheese ?Snacks - celery, carrots, cucumbers, radishes, raisins, apple ?Dinner - chicken sandwich, salad with little dressing  ? ?Exercise: walking 2-4 miles a day as fast as he can walk, started shooting basketball on Sat mornings for 1 hour ? ?Family History: The patient's family history includes Alcohol abuse in his father and mother; Aneurysm in his maternal grandfather; Cancer in his father and mother.  ? ?Social History: 12-pack year smoking history, quit 1981 ? ?Labs: ?07/27/2021: TC 217, TG 96, HDL 40, VLDL 19, LDL-C 158 (no statin) ?05/06/2020: TC 205, TG 128,  HDL 42, VLDL 23, LDL-C 140 (no statin) ? ?Past Medical History:  ?Diagnosis Date  ? Back pain   ? Hypertension   ? Mixed hyperlipidemia   ? Squamous cell carcinoma of scalp   ? ? ?Current Outpatient Medications on File Prior to Visit  ?Medication Sig Dispense Refill  ? acetaminophen (TYLENOL) 325 MG tablet Take 2 tablets (650 mg total) by mouth every 6 (six) hours as needed for mild pain, headache or fever.    ? albuterol (VENTOLIN HFA) 108 (90 Base) MCG/ACT inhaler Inhale 1-2 puffs into the lungs every 6 (six) hours as needed for shortness of breath.    ? aspirin EC 81 MG tablet Take 1 tablet (81 mg total) by mouth daily. Swallow whole. 150 tablet 2  ? Cholecalciferol (VITAMIN D-3) 25 MCG (1000 UT) CAPS Take 1,000 Units by mouth daily.    ? CINNAMON PO Take 1,000 mg by mouth daily.    ? clopidogrel (PLAVIX) 75 MG tablet Take 1 tablet (75 mg total) by mouth daily. 30 tablet 9  ? Coenzyme Q10 100 MG capsule Take 100 mg by mouth daily.    ? cyanocobalamin 1000 MCG tablet Take 1,000 mcg by mouth daily.    ? ketorolac (TORADOL) 10 MG tablet Take 1 tablet (10 mg total) by mouth every 6 (six) hours as needed. 20 tablet 0  ? magnesium 30 MG tablet Take 30 mg by mouth daily.    ? metoprolol tartrate (  LOPRESSOR) 25 MG tablet Take 12.5 mg by mouth 2 (two) times daily.    ? POTASSIUM PO Take 1 tablet by mouth daily.    ? pyridoxine (B-6) 100 MG tablet Take 100 mg by mouth daily.    ? tamsulosin (FLOMAX) 0.4 MG CAPS capsule Take 1 capsule (0.4 mg total) by mouth daily after supper. 30 capsule 3  ? ZINC SULFATE PO Take 1 tablet by mouth daily.    ? ?No current facility-administered medications on file prior to visit.  ? ? ?Allergies  ?Allergen Reactions  ? Crestor [Rosuvastatin] Other (See Comments)  ?  Headache and short term memory loss  ? Simvastatin-High Dose Other (See Comments)  ?  Memory problems  ? ? ?Assessment/Plan: ? ?1. Hyperlipidemia - LDL at time of CABG was above goal of <55 mg/dL. No lipid panel since starting  rosuvastatin 20 mg in September 2022. Patient was due for a lipid panel in May at PCP visit, but will obtain a lipid panel, direct LDL and LFTs today. Encouraged patient to continue with exercise routine and healthy diet - overall is doing well with lifestyle modifications. Discussed additional medications pending lab results from today, likely either Zetia or Nexlizet (pt has Pharmacist, community). Patient was contacted about enrolling in the Prevail study with novel CETP inhibitor obicetrapib. Sent message to RN as patient states not interested in participating. Plan to continue rosuvastatin 20 mg daily and will follow-up with patient over the phone once labs return. ? ? ?Laurey Arrow, PharmD ?PGY1 Pharmacy Resident ? ?Megan E. Supple, PharmD, BCACP, CPP ?[id number] [place], [place] [address] ?Phone: [phone]; Fax: [phone] ?02/25/2022 10:11 AM ? ?

## 2022-02-25 NOTE — Patient Instructions (Signed)
Continue rosuvastatin 20 mg daily ?Stop by lab on the way out to get your lipid panel. Your LDL goal is < 55 mg/dL. ?We will call and follow-up with results and any medication changes. ?

## 2022-02-25 NOTE — Telephone Encounter (Signed)
Pt seen in lipid clinic today and is tolerating his statin well with plan to add on ezetimibe if needed to bring his LDL to goal. He mentioned at the end of his visit that he was not interested in participating in a clinical trial at this time. ?

## 2022-02-26 ENCOUNTER — Telehealth: Payer: Self-pay

## 2022-02-26 DIAGNOSIS — E782 Mixed hyperlipidemia: Secondary | ICD-10-CM

## 2022-02-26 MED ORDER — ROSUVASTATIN CALCIUM 40 MG PO TABS: 40.0000 mg | ORAL_TABLET | Freq: Every day | ORAL | 3 refills | Status: DC

## 2022-02-26 NOTE — Telephone Encounter (Addendum)
Notified patient of lipid panel. LDL of 60 above goal of <55. Patient agreed to increasing rosuvastatin 40 mg. Updated prescription and scheduled two month follow-up for lab work. ? ?

## 2022-02-28 ENCOUNTER — Other Ambulatory Visit: Payer: Self-pay | Admitting: Physician Assistant

## 2022-03-13 DIAGNOSIS — Z0289 Encounter for other administrative examinations: Secondary | ICD-10-CM | POA: Insufficient documentation

## 2022-03-16 ENCOUNTER — Encounter: Payer: 59 | Admitting: Physician Assistant

## 2022-03-16 ENCOUNTER — Ambulatory Visit: Payer: Self-pay

## 2022-03-16 DIAGNOSIS — Z0000 Encounter for general adult medical examination without abnormal findings: Secondary | ICD-10-CM

## 2022-03-16 LAB — POCT URINALYSIS DIPSTICK
Bilirubin, UA: NEGATIVE
Blood, UA: NEGATIVE
Glucose, UA: NEGATIVE
Ketones, UA: NEGATIVE
Leukocytes, UA: NEGATIVE
Nitrite, UA: NEGATIVE
Protein, UA: NEGATIVE
Spec Grav, UA: 1.025 (ref 1.010–1.025)
Urobilinogen, UA: 0.2 E.U./dL
pH, UA: 5.5 (ref 5.0–8.0)

## 2022-03-17 LAB — CMP12+LP+TP+TSH+6AC+PSA+CBC…
ALT: 36 IU/L (ref 0–44)
AST: 33 IU/L (ref 0–40)
Albumin/Globulin Ratio: 1.9 (ref 1.2–2.2)
Albumin: 4.4 g/dL (ref 3.8–4.8)
Alkaline Phosphatase: 65 IU/L (ref 44–121)
BUN/Creatinine Ratio: 14 (ref 10–24)
BUN: 16 mg/dL (ref 8–27)
Basophils Absolute: 0 10*3/uL (ref 0.0–0.2)
Basos: 0 %
Bilirubin Total: 0.6 mg/dL (ref 0.0–1.2)
Calcium: 9.7 mg/dL (ref 8.6–10.2)
Chloride: 104 mmol/L (ref 96–106)
Chol/HDL Ratio: 2.8 ratio (ref 0.0–5.0)
Cholesterol, Total: 116 mg/dL (ref 100–199)
Creatinine, Ser: 1.12 mg/dL (ref 0.76–1.27)
EOS (ABSOLUTE): 0.1 10*3/uL (ref 0.0–0.4)
Eos: 3 %
Estimated CHD Risk: <0.5 times avg. (ref 0.0–1.0)
Free Thyroxine Index: 1.5 (ref 1.2–4.9)
GGT: 56 IU/L (ref 0–65)
Globulin, Total: 2.3 g/dL (ref 1.5–4.5)
Glucose: 83 mg/dL (ref 70–99)
HDL: 41 mg/dL (ref >39)
Hematocrit: 43.1 % (ref 37.5–51.0)
Hemoglobin: 14 g/dL (ref 13.0–17.7)
Immature Grans (Abs): 0 10*3/uL (ref 0.0–0.1)
Immature Granulocytes: 0 %
Iron: 128 ug/dL (ref 38–169)
LDH: 172 IU/L (ref 121–224)
LDL Chol Calc (NIH): 57 mg/dL (ref 0–99)
Lymphocytes Absolute: 1.4 10*3/uL (ref 0.7–3.1)
Lymphs: 37 %
MCH: 27.7 pg (ref 26.6–33.0)
MCHC: 32.5 g/dL (ref 31.5–35.7)
MCV: 85 fL (ref 79–97)
Monocytes Absolute: 0.5 10*3/uL (ref 0.1–0.9)
Monocytes: 14 %
Neutrophils Absolute: 1.7 10*3/uL (ref 1.4–7.0)
Neutrophils: 46 %
Phosphorus: 4 mg/dL (ref 2.8–4.1)
Platelets: 288 10*3/uL (ref 150–450)
Potassium: 4.8 mmol/L (ref 3.5–5.2)
Prostate Specific Ag, Serum: 3.7 ng/mL (ref 0.0–4.0)
RBC: 5.05 x10E6/uL (ref 4.14–5.80)
RDW: 13.8 % (ref 11.6–15.4)
Sodium: 139 mmol/L (ref 134–144)
T3 Uptake Ratio: 26 % (ref 24–39)
T4, Total: 5.8 ug/dL (ref 4.5–12.0)
TSH: 1.58 u[IU]/mL (ref 0.450–4.500)
Total Protein: 6.7 g/dL (ref 6.0–8.5)
Triglycerides: 95 mg/dL (ref 0–149)
Uric Acid: 6.2 mg/dL (ref 3.8–8.4)
VLDL Cholesterol Cal: 18 mg/dL (ref 5–40)
WBC: 3.7 10*3/uL (ref 3.4–10.8)
eGFR: 72 mL/min/{1.73_m2} (ref >59)

## 2022-03-23 ENCOUNTER — Encounter: Payer: 59 | Admitting: Physician Assistant

## 2022-03-26 ENCOUNTER — Encounter: Payer: Self-pay | Admitting: Physician Assistant

## 2022-03-26 ENCOUNTER — Encounter: Payer: 59 | Admitting: Physician Assistant

## 2022-03-26 ENCOUNTER — Ambulatory Visit: Payer: Self-pay | Admitting: Physician Assistant

## 2022-03-26 VITALS — BP 128/73 | HR 45 | Temp 97.8°F | Resp 14 | Ht 69.0 in | Wt 184.0 lb

## 2022-03-26 DIAGNOSIS — G3184 Mild cognitive impairment, so stated: Secondary | ICD-10-CM

## 2022-03-26 DIAGNOSIS — Z0000 Encounter for general adult medical examination without abnormal findings: Secondary | ICD-10-CM

## 2022-03-26 MED ORDER — TAMSULOSIN HCL 0.4 MG PO CAPS: 0.4000 mg | ORAL_CAPSULE | Freq: Every day | ORAL | 3 refills | Status: DC

## 2022-03-26 MED ORDER — ALBUTEROL SULFATE HFA 108 (90 BASE) MCG/ACT IN AERS: 2.0000 | INHALATION_SPRAY | Freq: Four times a day (QID) | RESPIRATORY_TRACT | 6 refills | Status: AC | PRN

## 2022-03-26 NOTE — Progress Notes (Signed)
[facility]  ____________________________________________   None    (approximate)  I have reviewed the triage vital signs and the nursing notes.   HISTORY  Chief Complaint Annual Exam   HPI Alexander Quinn is a 66 y.o. male patient presents for annual physical exam.  Patient requesting refill for albuterol and Flomax.  Patient states he is experiencing short-term memory loss over the past 3 months.         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 LIMA TO LAD, SVG TO OM, SVG TO DIAG., SVG TO PD-PL SEQUENTIALLY;  Surgeon: Melrose Nakayama, MD;  Location: [facility];  Service: Open Heart Surgery;  Laterality: N/A;   ENDARTERECTOMY  07/31/2021   Procedure: ENDARTERECTOMY CORONARY ARTERY;  Surgeon: Melrose Nakayama, MD;  Location: [facility];  Service: Open Heart Surgery;;   ENDOVEIN HARVEST OF GREATER SAPHENOUS VEIN Right 07/31/2021   Procedure: ENDOVEIN HARVEST OF GREATER SAPHENOUS VEIN;  Surgeon: Melrose Nakayama, MD;  Location: [facility];  Service: Open Heart Surgery;  Laterality: Right;   LEFT HEART CATH AND CORONARY ANGIOGRAPHY N/A 07/28/2021   Procedure: LEFT  HEART CATH AND CORONARY ANGIOGRAPHY;  Surgeon: Jettie Booze, MD;  Location: [facility];  Service: Cardiovascular;  Laterality: N/A;   LITHOTRIPSY  12/2019   Covid Screen prior to procedure & negative per patient   MENISCUS REPAIR Left 2010   TEE WITHOUT CARDIOVERSION N/A 07/31/2021   Procedure: TRANSESOPHAGEAL ECHOCARDIOGRAM (TEE);  Surgeon: Melrose Nakayama, MD;  Location: [facility];  Service: Open Heart Surgery;  Laterality: N/A;    Prior to Admission medications   Medication Sig Start Date End Date Taking? Authorizing Provider  acetaminophen (TYLENOL) 325 MG tablet Take 2 tablets (650 mg total) by mouth every 6 (six) hours as needed for mild pain, headache or fever. 08/04/21  Yes Barrett, Erin R, PA-C  albuterol (VENTOLIN HFA) 108 (90 Base) MCG/ACT inhaler Inhale 1-2 puffs into the lungs every 6 (six) hours as needed for shortness of breath. 12/16/16  Yes [provider]  aspirin EC 81 MG tablet Take 1 tablet (81 mg total) by mouth daily. Swallow whole. 08/04/21 08/04/22 Yes Barrett, Lodema Hong, PA-C  Cholecalciferol (VITAMIN D-3) 25 MCG (1000 UT) CAPS Take 1,000 Units by mouth daily.   Yes [provider]  CINNAMON PO Take 1,000 mg by mouth daily. 05/04/19  Yes [provider]  clopidogrel (PLAVIX) 75 MG tablet Take 1 tablet (75 mg total) by mouth daily. 11/13/21  Yes Jettie Booze, MD  Coenzyme Q10 100 MG capsule Take 100  mg by mouth daily.   Yes [provider]  cyanocobalamin 1000 MCG tablet Take 1,000 mcg by mouth daily.   Yes [provider]  magnesium 30 MG tablet Take 30 mg by mouth daily.   Yes [provider]  metoprolol tartrate (LOPRESSOR) 25 MG tablet Take 12.5 mg by mouth 2 (two) times daily.   Yes [provider]  POTASSIUM PO Take 1 tablet by mouth daily.   Yes [provider]  pyridoxine (B-6) 100 MG tablet Take 100 mg by mouth daily. 05/04/19  Yes [provider]  rosuvastatin  (CRESTOR) 40 MG tablet Take 1 tablet (40 mg total) by mouth daily. 02/26/22  Yes Jettie Booze, MD  tamsulosin (FLOMAX) 0.4 MG CAPS capsule Take 1 capsule (0.4 mg total) by mouth daily after supper. 12/02/21  Yes Sable Feil, PA-C  ZINC SULFATE PO Take 1 tablet by mouth daily.   Yes [provider]  ketorolac (TORADOL) 10 MG tablet Take 1 tablet (10 mg total) by mouth every 6 (six) hours as needed. Patient not taking: Reported on 03/26/2022 10/14/21   Sable Feil, PA-C    Allergies Crestor [rosuvastatin] and Simvastatin-high dose  Family History  Problem Relation Age of Onset   Cancer Mother    Alcohol abuse Mother    Alcohol abuse Father    Cancer Father    Aneurysm Maternal Grandfather     Social History Social History   Tobacco Use   Smoking status: Former   Smokeless tobacco: Never  Substance Use Topics   Alcohol use: Never   Drug use: Never    Review of Systems Constitutional: No fever/chills Eyes: No visual changes. ENT: No sore throat. Cardiovascular: Denies chest pain.PAF Respiratory: Denies shortness of breath.  Asthma Gastrointestinal: No abdominal pain.  No nausea, no vomiting.  No diarrhea.  No constipation. Genitourinary: Negative for dysuria. Musculoskeletal: Negative for back pain. Skin: Negative for rash. Neurological: Negative for headaches, focal weakness or numbness. Endocrine: Hyperlipidemia and hypertension Hematological/Lymphatic:  Allergic/Immunilogical: High-dose statins ____________________________________________   PHYSICAL EXAM:  VITAL SIGNS: BP is 128/73, pulse 45, respiration 14, temperature 97.8, patient 95% O2 sat on room air.  Patient weighs 184 pounds BMI is 27.17. Constitutional: Alert and oriented. Well appearing and in no acute distress. Eyes: Conjunctivae are normal. PERRL. EOMI. Head: Atraumatic. Nose: No congestion/rhinnorhea. Mouth/Throat: Mucous membranes are moist.  Oropharynx non-erythematous. Neck:  No stridor.  No cervical spine tenderness to palpation. Hematological/Lymphatic/Immunilogical: No cervical lymphadenopathy. Cardiovascular: Normal rate, regular rhythm. Grossly normal heart sounds.  Good peripheral circulation. Respiratory: Normal respiratory effort.  No retractions. Lungs CTAB. Gastrointestinal: Soft and nontender. No distention. No abdominal bruits. No CVA tenderness. Genitourinary: Deferred Musculoskeletal: No lower extremity tenderness nor edema.  No joint effusions. Neurologic:  Normal speech and language. No gross focal neurologic deficits are appreciated. No gait instability. Skin:  Skin is warm, dry and intact. No rash noted. Psychiatric: Mood and affect are normal. Speech and behavior are normal.  ____________________________________________   LABS          Component Ref Range & Units 10 d ago 3 mo ago 5 mo ago 7 mo ago 1 yr ago 3 yr ago  Color, UA  Yellow  yellow    yellow    Clarity, UA  Clear  clear    clear    Glucose, UA Negative Negative  Negative    Negative    Bilirubin, UA  Negative  negative    negative  Ketones, UA  Negative  negative    negative    Spec Grav, UA 1.010 - 1.025 1.025  1.020    1.015    Blood, UA  Negative  negative    negative    pH, UA 5.0 - 8.0 5.5  6.0    7.0    Protein, UA Negative Negative  Negative    Negative    Urobilinogen, UA 0.2 or 1.0 E.U./dL 0.2  0.2    0.2    Nitrite, UA  Negative  negative    negative    Leukocytes, UA Negative Negative  Negative    Negative  SMALL Abnormal  R   Appearance   normal  CLEAR R  CLEAR R  medium  CLEAR Abnormal  R   Odor           Resulting Agency    [facility]  [facility]                             Component Ref Range & Units 10 d ago (03/16/22) 4 wk ago (02/25/22) 4 wk ago (02/25/22) 5 mo ago (10/13/21) 5 mo ago (10/13/21) 7 mo ago (08/03/21) 7 mo ago (08/03/21)  Glucose 70 - 99 mg/dL 83    131 High  CM   125 High  CM    Uric Acid 3.8 - 8.4 mg/dL 6.2          Comment:            Therapeutic target for gout patients: <6.0  BUN 8 - 27 mg/dL 16    20 R   16 R    Creatinine, Ser 0.76 - 1.27 mg/dL 1.12    1.56 High  R   1.05 R    eGFR >59 mL/min/1.73 72         BUN/Creatinine Ratio 10 - 24 14         Sodium 134 - 144 mmol/L 139    137 R   131 Low  R    Potassium 3.5 - 5.2 mmol/L 4.8    4.5 R   3.8 R    Chloride 96 - 106 mmol/L 104    108 R   97 Low  R    Calcium 8.6 - 10.2 mg/dL 9.7    9.7 R   8.8 Low  R    Phosphorus 2.8 - 4.1 mg/dL 4.0         Total Protein 6.0 - 8.5 g/dL 6.7  6.7        Albumin 3.8 - 4.8 g/dL 4.4  4.6        Globulin, Total 1.5 - 4.5 g/dL 2.3         Albumin/Globulin Ratio 1.2 - 2.2 1.9         Bilirubin Total 0.0 - 1.2 mg/dL 0.6  0.6        Alkaline Phosphatase 44 - 121 IU/L 65  69        LDH 121 - 224 IU/L 172         AST 0 - 40 IU/L 33  30        ALT 0 - 44 IU/L 36  35        GGT 0 - 65 IU/L 56         Iron 38 - 169 ug/dL 128  Cholesterol, Total 100 - 199 mg/dL 116   128       Triglycerides 0 - 149 mg/dL 95   155 High        HDL >39 mg/dL 41   42       VLDL Cholesterol Cal 5 - 40 mg/dL 18   27       LDL Chol Calc (NIH) 0 - 99 mg/dL 57   59       Chol/HDL Ratio 0.0 - 5.0 ratio 2.8   3.0 CM       Comment:                                   T. Chol/HDL Ratio                                              Men  Women                                1/2 Avg.Risk  3.4    3.3                                    Avg.Risk  5.0    4.4                                 2X Avg.Risk  9.6    7.1                                 3X Avg.Risk 23.4   11.0   Estimated CHD Risk 0.0 - 1.0 times avg.  < 0.5         Comment: The CHD Risk is based on the T. Chol/HDL ratio. Other  factors affect CHD Risk such as hypertension, smoking,  diabetes, severe obesity, and family history of  premature CHD.   TSH 0.450 - 4.500 uIU/mL 1.580         T4, Total 4.5 - 12.0 ug/dL 5.8         T3 Uptake Ratio 24 - 39 % 26         Free Thyroxine Index 1.2  - 4.9 1.5         Prostate Specific Ag, Serum 0.0 - 4.0 ng/mL 3.7         Comment: Roche ECLIA methodology.  According to the American Urological Association, Serum PSA should  decrease and remain at undetectable levels after radical  prostatectomy. The AUA defines biochemical recurrence as an initial  PSA value 0.2 ng/mL or greater followed by a subsequent confirmatory  PSA value 0.2 ng/mL or greater.  Values obtained with different assay methods or kits cannot be used  interchangeably. Results cannot be interpreted as absolute evidence  of the presence or absence of malignant disease.   WBC 3.4 - 10.8 x10E3/uL 3.7     12.4 High  R   14.8 High  R   RBC 4.14 - 5.80 x10E6/uL 5.05     5.66 R   4.10 Low  R  Hemoglobin 13.0 - 17.7 g/dL 14.0     14.4 R   11.5 Low  R   Hematocrit 37.5 - 51.0 % 43.1     47.7 R   34.8 Low  R   MCV 79 - 97 fL 85     84.3 R   84.9 R   MCH 26.6 - 33.0 pg 27.7     25.4 Low  R   28.0 R   MCHC 31.5 - 35.7 g/dL 32.5     30.2 R   33.0 R   RDW 11.6 - 15.4 % 13.8     13.4 R   13.9 R   Platelets 150 - 450 x10E3/uL 288     322 R   265 R   Neutrophils Not Estab. % 46     88 R     Lymphs Not Estab. % 37         Monocytes Not Estab. % 14         Eos Not Estab. % 3         Basos Not Estab. % 0         Neutrophils Absolute 1.4 - 7.0 x10E3/uL 1.7     10.9 High  R     Lymphocytes Absolute 0.7 - 3.1 x10E3/uL 1.4     0.8 R     Monocytes Absolute 0.1 - 0.9 x10E3/uL 0.5         EOS (ABSOLUTE) 0.0 - 0.4 x10E3/uL 0.1         Basophils Absolute 0.0 - 0.2 x10E3/uL 0.0     0.0 R     Immature Granulocytes Not Estab. % 0     0 R     Immature Grans              ____________________________________________  EKG  Marked sinus  Bradycardia at 42 bpm BORDERLINE RHYTHM ____________________________________________    ____________________________________________   INITIAL IMPRESSION / ASSESSMENT AND PLAN As part of my medical decision making, I reviewed the following data within  the electronic medical record:       Discussed lab and EKG findings with patient.  Will consult to neurology secondary to complaint of short-term memory loss to establish baseline and follow-up cognitive impairment.  Prescription for Flonase and albuterol was refilled.        ____________________________________________   FINAL CLINICAL IMPRESSION  Well exam   Mild cognitive impairment 3.    PAF 4.    Hyperlipidemia 5.     Hypertension ED Discharge Orders     None        Note:  This document was prepared using Dragon voice recognition software and may include unintentional dictation errors.  

## 2022-03-26 NOTE — Addendum Note (Signed)
Addended by: Aliene Altes on: 03/26/2022 02:51 PM   Modules accepted: Orders

## 2022-03-26 NOTE — Progress Notes (Signed)
Pt presents today to complete physical, Pt denies any issues or concerns at this time.. Pt also requesting rx refill flomax and albuterol inhaler. Burna Sis

## 2022-04-28 ENCOUNTER — Other Ambulatory Visit: Payer: Self-pay

## 2022-04-28 ENCOUNTER — Other Ambulatory Visit: Payer: 59

## 2022-04-28 DIAGNOSIS — E782 Mixed hyperlipidemia: Secondary | ICD-10-CM | POA: Diagnosis not present

## 2022-04-28 DIAGNOSIS — G8929 Other chronic pain: Secondary | ICD-10-CM | POA: Diagnosis not present

## 2022-04-28 DIAGNOSIS — M5440 Lumbago with sciatica, unspecified side: Secondary | ICD-10-CM | POA: Diagnosis not present

## 2022-04-28 DIAGNOSIS — R413 Other amnesia: Secondary | ICD-10-CM | POA: Diagnosis not present

## 2022-04-28 DIAGNOSIS — G3184 Mild cognitive impairment, so stated: Secondary | ICD-10-CM | POA: Diagnosis not present

## 2022-04-29 LAB — LIPID PANEL
Chol/HDL Ratio: 2.9 ratio (ref 0.0–5.0)
Cholesterol, Total: 120 mg/dL (ref 100–199)
HDL: 42 mg/dL (ref >39)
LDL Chol Calc (NIH): 60 mg/dL (ref 0–99)
Triglycerides: 92 mg/dL (ref 0–149)
VLDL Cholesterol Cal: 18 mg/dL (ref 5–40)

## 2022-04-29 LAB — HEPATIC FUNCTION PANEL
ALT: 36 IU/L (ref 0–44)
AST: 30 IU/L (ref 0–40)
Albumin: 4.8 g/dL (ref 3.8–4.8)
Alkaline Phosphatase: 72 IU/L (ref 44–121)
Bilirubin Total: 0.3 mg/dL (ref 0.0–1.2)
Bilirubin, Direct: 0.12 mg/dL (ref 0.00–0.40)
Total Protein: 7.1 g/dL (ref 6.0–8.5)

## 2022-05-26 ENCOUNTER — Other Ambulatory Visit: Payer: Self-pay

## 2022-05-26 MED ORDER — CLOPIDOGREL BISULFATE 75 MG PO TABS: 75.0000 mg | ORAL_TABLET | Freq: Every day | ORAL | 9 refills | Status: DC

## 2022-07-10 ENCOUNTER — Telehealth: Payer: Self-pay

## 2022-07-10 NOTE — Telephone Encounter (Signed)
Alexander Quinn left a voice mail message on the [facility] phone stating he tested positive for covid about an hour prior to his message & that 9 days prior he was exposed to an individual that was positive for covid.  Tried to call [name] at [phone].  Went straight to voice mail.  Left a call back message.  AMD

## 2022-07-31 ENCOUNTER — Other Ambulatory Visit: Payer: Self-pay

## 2022-07-31 MED ORDER — TAMSULOSIN HCL 0.4 MG PO CAPS: 0.4000 mg | ORAL_CAPSULE | Freq: Every day | ORAL | 3 refills | Status: DC

## 2022-08-25 DIAGNOSIS — H43813 Vitreous degeneration, bilateral: Secondary | ICD-10-CM | POA: Diagnosis not present

## 2022-10-27 ENCOUNTER — Other Ambulatory Visit: Payer: Self-pay | Admitting: Physician Assistant

## 2022-10-27 DIAGNOSIS — L578 Other skin changes due to chronic exposure to nonionizing radiation: Secondary | ICD-10-CM | POA: Diagnosis not present

## 2022-10-27 DIAGNOSIS — Z859 Personal history of malignant neoplasm, unspecified: Secondary | ICD-10-CM | POA: Diagnosis not present

## 2022-10-27 DIAGNOSIS — L570 Actinic keratosis: Secondary | ICD-10-CM | POA: Diagnosis not present

## 2022-10-27 DIAGNOSIS — B351 Tinea unguium: Secondary | ICD-10-CM | POA: Diagnosis not present

## 2022-10-27 DIAGNOSIS — Z872 Personal history of diseases of the skin and subcutaneous tissue: Secondary | ICD-10-CM | POA: Diagnosis not present

## 2022-10-30 ENCOUNTER — Other Ambulatory Visit: Payer: Self-pay

## 2022-10-30 MED ORDER — TAMSULOSIN HCL 0.4 MG PO CAPS
0.4000 mg | ORAL_CAPSULE | Freq: Every day | ORAL | 3 refills | Status: DC
Start: 2022-10-30 — End: 2022-12-11

## 2022-11-17 ENCOUNTER — Telehealth: Payer: Self-pay | Admitting: *Deleted

## 2022-11-17 NOTE — Telephone Encounter (Signed)
   Pre-operative Risk Assessment    Patient Name: Alexander Quinn  DOB: [birth year] MRN: [id number]     Request for Surgical Clearance    Procedure:  Dental Extraction - Amount of Teeth to be Pulled:  2 SURGICAL EXTRACTIONS TO BE DONE  Date of Surgery:  Clearance TBD URGENT                                Surgeon:  DR. MARY MAKHLOUF, DMD Surgeon's Group or Practice Name:  DR. Johnnette Litter, DMD Phone number:  [phone] Fax number:  [phone]   Type of Clearance Requested:   - Medical  - Pharmacy:  Hold Clopidogrel (Plavix) x 3 DAYS PRIOR AND RESUME 24 HOURS POST PROCEDURE AS LONG AAS BLEEDING IS CONTROLLED    Type of Anesthesia:  Local ? NOT INDICATED    Additional requests/questions:    [name]   11/17/2022, 3:03 PM

## 2022-11-17 NOTE — Telephone Encounter (Signed)
    Primary Cardiologist: Larae Grooms, MD  Chart reviewed as part of pre-operative protocol coverage. Simple dental extractions are considered low risk procedures per guidelines and generally do not require any specific cardiac clearance. It is also generally accepted that for simple extractions and dental cleanings, there is no need to interrupt blood thinner therapy.   SBE prophylaxis is not required for the patient.  I will route this recommendation to the requesting party via Epic fax function and remove from pre-op pool.  Please call with questions.  [name], PA-C 11/17/2022, 3:26 PM

## 2022-11-18 ENCOUNTER — Other Ambulatory Visit: Payer: Self-pay

## 2022-11-18 DIAGNOSIS — E7849 Other hyperlipidemia: Secondary | ICD-10-CM

## 2022-11-18 NOTE — Progress Notes (Signed)
Pt presents today for lipid panel.

## 2022-11-19 LAB — LIPID PANEL
Chol/HDL Ratio: 2.9 ratio (ref 0.0–5.0)
Cholesterol, Total: 137 mg/dL (ref 100–199)
HDL: 47 mg/dL (ref >39)
LDL Chol Calc (NIH): 70 mg/dL (ref 0–99)
Triglycerides: 108 mg/dL (ref 0–149)
VLDL Cholesterol Cal: 20 mg/dL (ref 5–40)

## 2022-12-05 ENCOUNTER — Other Ambulatory Visit: Payer: Self-pay | Admitting: Physician Assistant

## 2022-12-11 ENCOUNTER — Other Ambulatory Visit: Payer: Self-pay

## 2022-12-11 DIAGNOSIS — N200 Calculus of kidney: Secondary | ICD-10-CM

## 2022-12-11 DIAGNOSIS — Z87442 Personal history of urinary calculi: Secondary | ICD-10-CM

## 2022-12-13 MED ORDER — TAMSULOSIN HCL 0.4 MG PO CAPS: 0.4000 mg | ORAL_CAPSULE | Freq: Every day | ORAL | 3 refills | Status: AC

## 2023-01-28 ENCOUNTER — Other Ambulatory Visit: Payer: Self-pay

## 2023-01-29 ENCOUNTER — Other Ambulatory Visit: Payer: Self-pay

## 2023-01-29 DIAGNOSIS — E7849 Other hyperlipidemia: Secondary | ICD-10-CM

## 2023-01-29 NOTE — Progress Notes (Signed)
Completed fasting lipid panel.

## 2023-01-30 LAB — LIPID PANEL
Chol/HDL Ratio: 2.3 ratio (ref 0.0–5.0)
Cholesterol, Total: 121 mg/dL (ref 100–199)
HDL: 52 mg/dL (ref >39)
LDL Chol Calc (NIH): 53 mg/dL (ref 0–99)
Triglycerides: 78 mg/dL (ref 0–149)
VLDL Cholesterol Cal: 16 mg/dL (ref 5–40)

## 2023-02-09 NOTE — Progress Notes (Unsigned)
Cardiology Office Note   Date:  02/11/2023   ID:  Alexander Quinn, DOB 09/05/1956, MRN [id number]  PCP:  Eden Lathe, MD    No chief complaint on file.  CAD  Wt Readings from Last 3 Encounters:  02/11/23 194 lb 6.4 oz (88.2 kg)  03/26/22 184 lb (83.5 kg)  01/27/22 195 lb (88.5 kg)       History of Present Illness: Alexander Quinn is a 67 y.o. male   with past history of CAD.  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  He has past medical history significant for hypertension, dyslipidemia, obesity, and suspected coronary artery disease.  He has a 12-pack-year smoking history but has not smoked since 1981.  He was apparently having some cardiac symptoms last year and underwent a Cardiolite stress test by [facility] health care.  This demonstrated mild to moderate lateral wall ischemia.  He did not follow through with any further work-up preferring to get a second opinion but has not seen any other healthcare provider for management of his suspected coronary disease in the interim.  He presented to the emergency room at [facility] by way of EMS on 07/26/2021 after having a syncopal episode while playing basketball at the [facility].  This episode apparently lasted less than a minute and was accompanied by shortness of breath.  In the emergency room, he was noted to have a blood pressure of 190/94.  EKG showed normal sinus rhythm with no acute ischemic changes.  Chest x-ray was unremarkable.  Initial troponin was 44 and serial levels rose slightly to 55 in 66.  Given his previous positive Cardiolite stress test, decision was made to admit to the hospital for further evaluation and management.  He was seen by cardiology service.  He was started on aspirin, heparin infusion, and rosuvastatin.  He was also started on Avapro for his hypertension with appropriate response.  He had no further chest pain or shortness of breath after admission  to the hospital. An echocardiogram was obtained that showed an ejection fraction of 60 to 65%.  There is no regional wall abnormalities.  RV function was normal.  There was no significant valvular abnormality. Left heart catheterization  demonstrates severe three-vessel coronary artery disease."     Cath showed: "Mid LM lesion is 30% stenosed.   RPDA lesion is 75% stenosed.   RPAV lesion is 80% stenosed.   Prox Cx to Mid Cx lesion is 90% stenosed.   Mid LAD-2 lesion is 99% stenosed.   Mid LAD-1 lesion is 50% stenosed.   Dist RCA lesion is 50% stenosed.   The left ventricular systolic function is normal.   LV end diastolic pressure is normal.   The left ventricular ejection fraction is 55-65% by visual estimate.   There is no aortic valve stenosis.   Severe three vessel CAD. Mid LAD subtotal occlusion at a large diagonal branch.  Proximal to mid circumflex lesion and distal RCA and branch vessel disease.  "   Done well since the surgery in 07/2021. Amiodarone was decreased.  Noted in 2023: "Walks at a fast pace nearly daily for 30 minutes.  > 3 mph.   Played golf yesterday.   Wife with AFib and on Amiodarone."  Felt that he had short-term memory loss, headaches from Crestor and another statin (high-dose simvastatin.  Symptoms ultimately resolved.  He did see the Pharm.D. lipid clinic in  2023.  Also addressed in 6/23 with Megan: "Reports that his hamstrings and calf muscles bother him but uses roll-on biofreeze everyday which helps. Patient has an active lifestyle - walking 2-4 miles every day at a brisk pace. "  In 2024, he reports again issues with short term memory that he attributes to the flomax and Crestor.   Walks 30 minutes at a time, 1.5 miles without problems.   Denies : Chest pain. Dizziness. Leg edema. Nitroglycerin use. Orthopnea. Palpitations. Paroxysmal nocturnal dyspnea. Shortness of breath. Syncope.   Past Medical History:  Diagnosis Date   Back pain    Hypertension     Mixed hyperlipidemia    Squamous cell carcinoma of scalp     Past Surgical History:  Procedure Laterality Date   BACK SURGERY     67 years of age   BIOPSY PROSTATE  03/2020   2nd Biopsy - Negative per patient   CATARACT EXTRACTION, BILATERAL Bilateral 2000   COLONOSCOPY  2020   2nd Colonoscopy - Performed in [place] N/A 07/31/2021   Procedure: CORONARY ARTERY BYPASS GRAFTING (CABG)x 5 USING LIMA  AND RIGHT GSV. LIMA TO LAD, SVG TO OM, SVG TO DIAG., SVG TO PD-PL SEQUENTIALLY;  Surgeon: Melrose Nakayama, MD;  Location: [place];  Service: Open Heart Surgery;  Laterality: N/A;   ENDARTERECTOMY  07/31/2021   Procedure: ENDARTERECTOMY CORONARY ARTERY;  Surgeon: Melrose Nakayama, MD;  Location: [place];  Service: Open Heart Surgery;;   ENDOVEIN HARVEST OF GREATER SAPHENOUS VEIN Right 07/31/2021   Procedure: ENDOVEIN HARVEST OF GREATER SAPHENOUS VEIN;  Surgeon: Melrose Nakayama, MD;  Location: [place];  Service: Open Heart Surgery;  Laterality: Right;   LEFT HEART CATH AND CORONARY ANGIOGRAPHY N/A 07/28/2021   Procedure: LEFT HEART CATH AND CORONARY ANGIOGRAPHY;  Surgeon: Jettie Booze, MD;  Location: [place] [facility];  Service: Cardiovascular;  Laterality: N/A;   LITHOTRIPSY  12/2019   Covid Screen prior to procedure & negative per patient   MENISCUS REPAIR Left 2010   TEE WITHOUT CARDIOVERSION N/A 07/31/2021   Procedure: TRANSESOPHAGEAL ECHOCARDIOGRAM (TEE);  Surgeon: Melrose Nakayama, MD;  Location: [place];  Service: Open Heart Surgery;  Laterality: N/A;     Current Outpatient Medications  Medication Sig Dispense Refill   acetaminophen (TYLENOL) 325 MG tablet Take 2 tablets (650 mg total) by mouth every 6 (six) hours as needed for mild pain, headache or fever.     albuterol (VENTOLIN HFA) 108 (90 Base) MCG/ACT inhaler Inhale 2 puffs into the lungs every 6 (six) hours as needed for shortness of breath. 18 g 6   Cholecalciferol  (VITAMIN D-3) 25 MCG (1000 UT) CAPS Take 1,000 Units by mouth daily.     CINNAMON PO Take 1,000 mg by mouth daily.     clopidogrel (PLAVIX) 75 MG tablet Take 1 tablet (75 mg total) by mouth daily. 30 tablet 9   Coenzyme Q10 100 MG capsule Take 100 mg by mouth daily.     cyanocobalamin 1000 MCG tablet Take 1,000 mcg by mouth daily.     JUBLIA 10 % SOLN Apply topically.     ketorolac (TORADOL) 10 MG tablet Take 1 tablet (10 mg total) by mouth every 6 (six) hours as needed. 20 tablet 0   magnesium 30 MG tablet Take 30 mg by mouth daily.     metoprolol tartrate (LOPRESSOR) 25 MG tablet Take 12.5 mg by mouth 2 (two) times daily.  POTASSIUM PO Take 1 tablet by mouth daily.     pyridoxine (B-6) 100 MG tablet Take 100 mg by mouth daily.     rosuvastatin (CRESTOR) 40 MG tablet Take 1 tablet (40 mg total) by mouth daily. 90 tablet 3   tamsulosin (FLOMAX) 0.4 MG CAPS capsule Take 1 capsule (0.4 mg total) by mouth daily after supper. 30 capsule 3   tamsulosin (FLOMAX) 0.4 MG CAPS capsule Take 1 capsule (0.4 mg total) by mouth daily. 90 capsule 3   ZINC SULFATE PO Take 1 tablet by mouth daily.     No current facility-administered medications for this visit.    Allergies:   Crestor [rosuvastatin] and Simvastatin-high dose    Social History:  The patient  reports that he has quit smoking. He has never used smokeless tobacco. He reports that he does not drink alcohol and does not use drugs.   Family History:  The patient's family history includes Alcohol abuse in his father and mother; Aneurysm in his maternal grandfather; Cancer in his father and mother.    ROS:  Please see the history of present illness.   Otherwise, review of systems are positive for still with memory complaints.   All other systems are reviewed and negative.    PHYSICAL EXAM: VS:  BP (!) 164/68   Pulse (!) 46   Ht 5' 8.5" (1.74 m)   Wt 194 lb 6.4 oz (88.2 kg)   SpO2 96%   BMI 29.13 kg/m  , BMI Body mass index is 29.13  kg/m. GEN: Well nourished, well developed, in no acute distress HEENT: normal Neck: no JVD, carotid bruits, or masses Cardiac: bradycardia; no murmurs, rubs, or gallops,no edema  Respiratory:  clear to auscultation bilaterally, normal work of breathing GI: soft, nontender, nondistended, + BS MS: no deformity or atrophy Skin: warm and dry, no rash Neuro:  Strength and sensation are intact Psych: euthymic mood, full affect   EKG:   The ekg ordered today demonstrates sinus bradycardia, nonspecific ST changes   Recent Labs: 03/16/2022: BUN 16; Creatinine, Ser 1.12; Hemoglobin 14.0; Platelets 288; Potassium 4.8; Sodium 139; TSH 1.580 04/28/2022: ALT 36   Lipid Panel    Component Value Date/Time   CHOL 121 01/29/2023 0821   TRIG 78 01/29/2023 0821   HDL 52 01/29/2023 0821   CHOLHDL 2.3 01/29/2023 0821   CHOLHDL 5.4 07/27/2021 0246   VLDL 19 07/27/2021 0246   LDLCALC 53 01/29/2023 0821   LDLDIRECT 60 02/25/2022 0936     Other studies Reviewed: Additional studies/ records that were reviewed today with results demonstrating: Labs reviewed.  LDL 60 in June 2023..   ASSESSMENT AND PLAN:  CAD: No angina. Continue aggressive secondary prevention.  Walks regularly without angina.  Continue healthy lifestyle.  He does try to avoid processed foods.  No bleeding issues.  Continue clopidogrel monotherapy. Hyperlipidemia: Felt that he had short-term memory loss, headaches from Crestor and another statin (high-dose simvastatin.  Was referred to Pharm.D. lipid clinic.  The symptoms ultimately resolved while he was on a statin.  He was willing to stay on his statin. PAF: Occurred at the time of surgery.  Amiodarone and anticoagulation were stopped. Elevated BP in the MD's ofice.  At home, 120s/60s.  WOuld have him check 2-3 x / week.  We spoke about the risks of untreated HTN.  He will continue to check readings at home and let us know if readings are greater than 140/90. Unclear etiology of his  memory symptoms.  He felt like symptoms got better while he was continuing to take rosuvastatin.  He will try switching his Flomax and see if there is improvement.  If not, he should let us know and we will look at what other options we have from a cardiac standpoint.   Current medicines are reviewed at length with the patient today.  The patient concerns regarding his medicines were addressed.  The following changes have been made:  No change  Labs/ tests ordered today include:  No orders of the defined types were placed in this encounter.   Recommend 150 minutes/week of aerobic exercise Low fat, low carb, high fiber diet recommended  Disposition:   FU in 1 year   Signed, Larae Grooms, MD  02/11/2023 8:31 AM    [facility] [address], [place], [place]  [address] Phone: [phone]; Fax: [phone]

## 2023-02-11 ENCOUNTER — Ambulatory Visit: Payer: 59 | Attending: Interventional Cardiology | Admitting: Interventional Cardiology

## 2023-02-11 ENCOUNTER — Encounter: Payer: Self-pay | Admitting: Interventional Cardiology

## 2023-02-11 VITALS — BP 164/68 | HR 46 | Ht 68.5 in | Wt 194.4 lb

## 2023-02-11 DIAGNOSIS — I25118 Atherosclerotic heart disease of native coronary artery with other forms of angina pectoris: Secondary | ICD-10-CM

## 2023-02-11 DIAGNOSIS — I480 Paroxysmal atrial fibrillation: Secondary | ICD-10-CM

## 2023-02-11 DIAGNOSIS — R030 Elevated blood-pressure reading, without diagnosis of hypertension: Secondary | ICD-10-CM

## 2023-02-11 DIAGNOSIS — E782 Mixed hyperlipidemia: Secondary | ICD-10-CM

## 2023-02-11 NOTE — Patient Instructions (Signed)
Medication Instructions:  Your physician recommends that you continue on your current medications as directed. Please refer to the Current Medication list given to you today.  *If you need a refill on your cardiac medications before your next appointment, please call your pharmacy*   Lab Work: none If you have labs (blood work) drawn today and your tests are completely normal, you will receive your results only by: MyChart Message (if you have MyChart) OR A paper copy in the mail If you have any lab test that is abnormal or we need to change your treatment, we will call you to review the results.   Testing/Procedures: none   Follow-Up: At [facility], you and your health needs are our priority.  As part of our continuing mission to provide you with exceptional heart care, we have created designated Provider Care Teams.  These Care Teams include your primary Cardiologist (physician) and Advanced Practice Providers (APPs -  Physician Assistants and Nurse Practitioners) who all work together to provide you with the care you need, when you need it.  We recommend signing up for the patient portal called "MyChart".  Sign up information is provided on this After Visit Summary.  MyChart is used to connect with patients for Virtual Visits (Telemedicine).  Patients are able to view lab/test results, encounter notes, upcoming appointments, etc.  Non-urgent messages can be sent to your provider as well.   To learn more about what you can do with MyChart, go to [email or web address].    Your next appointment:   12 month(s)  Provider:   Jayadeep Varanasi, MD     Other Instructions    

## 2023-02-14 ENCOUNTER — Other Ambulatory Visit: Payer: Self-pay | Admitting: Interventional Cardiology

## 2023-02-14 ENCOUNTER — Other Ambulatory Visit: Payer: Self-pay | Admitting: Physician Assistant

## 2023-02-15 NOTE — Telephone Encounter (Signed)
Rx refill sent to pharmacy. 

## 2023-02-17 ENCOUNTER — Other Ambulatory Visit: Payer: Self-pay | Admitting: Physician Assistant

## 2023-02-17 ENCOUNTER — Ambulatory Visit: Payer: Self-pay | Admitting: Physician Assistant

## 2023-02-17 ENCOUNTER — Encounter: Payer: Self-pay | Admitting: Physician Assistant

## 2023-02-17 VITALS — BP 150/80 | HR 53 | Resp 12 | Ht 69.0 in | Wt 191.0 lb

## 2023-02-17 DIAGNOSIS — R413 Other amnesia: Secondary | ICD-10-CM

## 2023-02-17 DIAGNOSIS — R32 Unspecified urinary incontinence: Secondary | ICD-10-CM

## 2023-02-17 MED ORDER — MIRABEGRON ER 25 MG PO TB24: 25.0000 mg | ORAL_TABLET | Freq: Every day | ORAL | 0 refills | Status: DC

## 2023-02-17 NOTE — Progress Notes (Unsigned)
   Subjective: Memory loss, urinary frequency, urinary urgency, urinary incontinence.    Patient ID: Alexander Quinn, male    DOB: 01/10/1956, 67 y.o.   MRN: [id number]  HPI Patient presents for concern for memory loss, urinary frequency, urinary urgency, and urinary continence.  Patient states wife has been complaining for greater than 4 months about memory loss.  Patient states he believes it is age-related.  Patient also complain of urinary discomfort status post starting Flomax for BPH and nonobstructive kidney stones.  Patient states he is having approximately 2 accidents of incontinence during the day and nocturia.  Patient voiced these concerns over serologies.  Patient also voiced concern for muscle cramping which he believes is secondary to his statin use.  Patient states his lovastatin was increased from 20 mg to 40 mg a start developing muscle cramps.   Review of Systems Hypertension, PAF, hyperlipidemia, BPH, and renal stones.    Objective:   Physical Exam        Assessment & Plan:

## 2023-02-17 NOTE — Progress Notes (Signed)
Pt presents today with questions on new medication and concerns./CL,RMA

## 2023-02-17 NOTE — Progress Notes (Deleted)
   Subjective:    Patient ID: Alexander Quinn, male    DOB: 1956-03-15, 67 y.o.   MRN: [id number]  HPI    Review of Systems     Objective:   Physical Exam        Assessment & Plan:

## 2023-02-17 NOTE — Progress Notes (Signed)
   Subjective: Patient presents with memory loss, and urinary urgency/frequency/incontinence.    Patient ID: Alexander Quinn, male    DOB: [birth year], 67 y.o.   MRN: [id number]  HPI Patient complains of 2 to 3 months of memory loss.  Patient states his wife is becoming upset because of short-term memory loss.  Patient believes is age-related but his wife was concerned for dementia.  Patient also complain of urinary frequency/urgency/incontinence secondary to taking Flomax.  Patient describes medication by urologist secondary to BPH and multiple nonobstructive kidney stones.  Patient states episodes of incontinence is mostly during the day and is becoming embarrassing.  Patient has not discussed this complaint with his urologist.  Patient also complain of muscle cramps which he noticed after increasing his statin from 20 mg to 40 mg.  Review all of his lab  Review of Systems Review of lipid profile shows cholesterol 121 triglycerides 78 HDL 52 VLDL 16 and cholesterol/HDL ratio 2.3.  These findings were completed on 01/29/2023.    Objective:   Physical Exam BP is 150/80, pulse 53, respiration 12, patient 97% O2 sat on room air.  Patient weighs 191 pounds BMI is 28.21      Assessment & Plan: Short-term memory loss, urinary incontinence, and myalgia.   Patient consulted to neurology for definitive evaluation for memory loss.  Patient advised to decrease Crestor from 40 to 20 mg.  Patient given a prescription for Myrbetriq and will follow-up in 2 weeks.

## 2023-02-18 NOTE — Addendum Note (Signed)
Addended by: Gardner Candle on: 02/18/2023 11:02 AM   Modules accepted: Orders

## 2023-03-03 NOTE — Progress Notes (Signed)
EKG completed 02/11/23 with his cardiologist Lance Muss, MD).  AMD

## 2023-03-05 ENCOUNTER — Ambulatory Visit: Payer: Self-pay

## 2023-03-05 DIAGNOSIS — Z0000 Encounter for general adult medical examination without abnormal findings: Secondary | ICD-10-CM

## 2023-03-05 LAB — POCT URINALYSIS DIPSTICK
Bilirubin, UA: NEGATIVE
Blood, UA: NEGATIVE
Glucose, UA: NEGATIVE
Ketones, UA: NEGATIVE
Leukocytes, UA: NEGATIVE
Nitrite, UA: NEGATIVE
Protein, UA: NEGATIVE
Spec Grav, UA: 1.01 (ref 1.010–1.025)
Urobilinogen, UA: 0.2 E.U./dL
pH, UA: 6.5 (ref 5.0–8.0)

## 2023-03-06 LAB — CMP12+LP+TP+TSH+6AC+PSA+CBC…
ALT: 35 IU/L (ref 0–44)
AST: 27 IU/L (ref 0–40)
Albumin/Globulin Ratio: 2.1 (ref 1.2–2.2)
Albumin: 4.4 g/dL (ref 3.9–4.9)
Alkaline Phosphatase: 81 IU/L (ref 44–121)
BUN/Creatinine Ratio: 13 (ref 10–24)
BUN: 14 mg/dL (ref 8–27)
Basophils Absolute: 0 10*3/uL (ref 0.0–0.2)
Basos: 0 %
Bilirubin Total: 0.5 mg/dL (ref 0.0–1.2)
Calcium: 9.2 mg/dL (ref 8.6–10.2)
Chloride: 103 mmol/L (ref 96–106)
Chol/HDL Ratio: 2.6 ratio (ref 0.0–5.0)
Cholesterol, Total: 120 mg/dL (ref 100–199)
Creatinine, Ser: 1.08 mg/dL (ref 0.76–1.27)
EOS (ABSOLUTE): 0.1 10*3/uL (ref 0.0–0.4)
Eos: 3 %
Estimated CHD Risk: <0.5 times avg. (ref 0.0–1.0)
Free Thyroxine Index: 1.5 (ref 1.2–4.9)
GGT: 50 IU/L (ref 0–65)
Globulin, Total: 2.1 g/dL (ref 1.5–4.5)
Glucose: 83 mg/dL (ref 70–99)
HDL: 47 mg/dL (ref >39)
Hematocrit: 47 % (ref 37.5–51.0)
Hemoglobin: 15.2 g/dL (ref 13.0–17.7)
Immature Grans (Abs): 0 10*3/uL (ref 0.0–0.1)
Immature Granulocytes: 0 %
Iron: 101 ug/dL (ref 38–169)
LDH: 183 IU/L (ref 121–224)
LDL Chol Calc (NIH): 46 mg/dL (ref 0–99)
Lymphocytes Absolute: 1.4 10*3/uL (ref 0.7–3.1)
Lymphs: 28 %
MCH: 27.5 pg (ref 26.6–33.0)
MCHC: 32.3 g/dL (ref 31.5–35.7)
MCV: 85 fL (ref 79–97)
Monocytes Absolute: 0.7 10*3/uL (ref 0.1–0.9)
Monocytes: 15 %
Neutrophils Absolute: 2.7 10*3/uL (ref 1.4–7.0)
Neutrophils: 54 %
Phosphorus: 2.9 mg/dL (ref 2.8–4.1)
Platelets: 303 10*3/uL (ref 150–450)
Potassium: 4.6 mmol/L (ref 3.5–5.2)
Prostate Specific Ag, Serum: 5.6 ng/mL — ABNORMAL HIGH (ref 0.0–4.0)
RBC: 5.53 x10E6/uL (ref 4.14–5.80)
RDW: 13.3 % (ref 11.6–15.4)
Sodium: 139 mmol/L (ref 134–144)
T3 Uptake Ratio: 24 % (ref 24–39)
T4, Total: 6.2 ug/dL (ref 4.5–12.0)
TSH: 2.55 u[IU]/mL (ref 0.450–4.500)
Total Protein: 6.5 g/dL (ref 6.0–8.5)
Triglycerides: 165 mg/dL — ABNORMAL HIGH (ref 0–149)
Uric Acid: 5.7 mg/dL (ref 3.8–8.4)
VLDL Cholesterol Cal: 27 mg/dL (ref 5–40)
WBC: 5 10*3/uL (ref 3.4–10.8)
eGFR: 74 mL/min/{1.73_m2} (ref >59)

## 2023-03-10 ENCOUNTER — Encounter: Payer: Self-pay | Admitting: Physician Assistant

## 2023-03-10 ENCOUNTER — Ambulatory Visit: Payer: Self-pay | Admitting: Physician Assistant

## 2023-03-10 VITALS — BP 177/91 | HR 48 | Temp 97.3°F | Resp 16 | Ht 69.0 in | Wt 195.0 lb

## 2023-03-10 DIAGNOSIS — N400 Enlarged prostate without lower urinary tract symptoms: Secondary | ICD-10-CM

## 2023-03-10 DIAGNOSIS — Z0000 Encounter for general adult medical examination without abnormal findings: Secondary | ICD-10-CM

## 2023-03-10 DIAGNOSIS — I10 Essential (primary) hypertension: Secondary | ICD-10-CM

## 2023-03-10 NOTE — Addendum Note (Signed)
Addended by: Gardner Candle on: 03/10/2023 11:08 AM   Modules accepted: Orders

## 2023-03-10 NOTE — Progress Notes (Signed)
Here for yearly physical with [facility] retired and denies any complaints.  Stated he checks his BP at home and finds it in normal limits.  BP elevated upon two checks at visit.  Radial pulse manually 48bpm and stated his pulse is always low and noted beta blocker on list.

## 2023-03-10 NOTE — Progress Notes (Signed)
[facility]   ____________________________________________   None    (approximate)  I have reviewed the triage vital signs and the nursing notes.   HISTORY  Chief Complaint Annual Exam  HPI Alexander Quinn is a 67 y.o. male patient presents for annual physical exam.  Patient was no concerns or complaints.  Patient past medical history remarkable for asymptomatic bradycardia which is followed by cardiologist.  Elevated PSA secondary to BPH followed by urologist.         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 LIMA TO LAD, SVG TO OM, SVG TO DIAG., SVG TO PD-PL SEQUENTIALLY;  Surgeon: Loreli Slot, MD;  Location: [facility] OR;  Service: Open Heart Surgery;  Laterality: N/A;   ENDARTERECTOMY  07/31/2021   Procedure: ENDARTERECTOMY CORONARY ARTERY;  Surgeon: Loreli Slot, MD;  Location: [facility] OR;  Service: Open Heart Surgery;;   ENDOVEIN HARVEST OF GREATER SAPHENOUS VEIN Right 07/31/2021   Procedure: ENDOVEIN HARVEST OF GREATER SAPHENOUS VEIN;  Surgeon: Loreli Slot, MD;  Location: [facility] OR;  Service: Open Heart Surgery;  Laterality: Right;   LEFT  HEART CATH AND CORONARY ANGIOGRAPHY N/A 07/28/2021   Procedure: LEFT HEART CATH AND CORONARY ANGIOGRAPHY;  Surgeon: Corky Crafts, MD;  Location: [facility] INVASIVE CV LAB;  Service: Cardiovascular;  Laterality: N/A;   LITHOTRIPSY  12/2019   Covid Screen prior to procedure & negative per patient   MENISCUS REPAIR Left 2010   TEE WITHOUT CARDIOVERSION N/A 07/31/2021   Procedure: TRANSESOPHAGEAL ECHOCARDIOGRAM (TEE);  Surgeon: Loreli Slot, MD;  Location: [facility] OR;  Service: Open Heart Surgery;  Laterality: N/A;    Prior to Admission medications   Medication Sig Start Date End Date Taking? Authorizing Provider  albuterol (VENTOLIN HFA) 108 (90 Base) MCG/ACT inhaler Inhale 2 puffs into the lungs every 6 (six) hours as needed for shortness of breath. 03/26/22  Yes Joni Reining, PA-C  Cholecalciferol (VITAMIN D-3) 25 MCG (1000 UT) CAPS Take 1,000 Units by mouth daily.   Yes [provider]  CINNAMON PO Take 1,000 mg by mouth daily. 05/04/19  Yes [provider]  clopidogrel (PLAVIX) 75 MG tablet Take 1 tablet (75 mg total) by mouth daily. 05/26/22  Yes Joni Reining, PA-C  Coenzyme Q10 100 MG capsule Take 100 mg by mouth daily.   Yes [provider]  cyanocobalamin 1000 MCG tablet Take 1,000 mcg by mouth daily.   Yes [provider]  magnesium 30 MG tablet Take 30 mg by mouth daily.   Yes [provider]  metoprolol tartrate (LOPRESSOR)  25 MG tablet Take 12.5 mg by mouth 2 (two) times daily.   Yes [provider]  POTASSIUM PO Take 1 tablet by mouth daily.   Yes [provider]  pyridoxine (B-6) 100 MG tablet Take 100 mg by mouth daily. 05/04/19  Yes [provider]  rosuvastatin (CRESTOR) 40 MG tablet TAKE 1 TABLET BY MOUTH EVERY DAY Patient taking differently: Take 20 mg by mouth daily. 02/15/23  Yes Corky Crafts, MD  tamsulosin (FLOMAX) 0.4 MG CAPS capsule Take 1 capsule (0.4 mg total) by mouth daily. 12/13/22  Yes  Joni Reining, PA-C  ZINC SULFATE PO Take 1 tablet by mouth daily.   Yes [provider]  acetaminophen (TYLENOL) 325 MG tablet Take 2 tablets (650 mg total) by mouth every 6 (six) hours as needed for mild pain, headache or fever. Patient not taking: Reported on 03/10/2023 08/04/21   Barrett, Rae Roam, PA-C  JUBLIA 10 % SOLN Apply topically. Patient not taking: Reported on 03/10/2023 01/21/23   [provider]  mirabegron ER (MYRBETRIQ) 25 MG TB24 tablet Take 1 tablet (25 mg total) by mouth daily. Patient not taking: Reported on 03/10/2023 02/17/23   Joni Reining, PA-C    Allergies Crestor [rosuvastatin] and Simvastatin-high dose  Family History  Problem Relation Age of Onset   Cancer Mother    Alcohol abuse Mother    Alcohol abuse Father    Cancer Father    Aneurysm Maternal Grandfather     Social History Social History   Tobacco Use   Smoking status: Former   Smokeless tobacco: Never  Substance Use Topics   Alcohol use: Never   Drug use: Never    Review of Systems Constitutional: No fever/chills Eyes: No visual changes. ENT: No sore throat. Cardiovascular: Denies chest pain. Respiratory: Denies shortness of breath. Gastrointestinal: No abdominal pain.  No nausea, no vomiting.  No diarrhea.  No constipation. Genitourinary: Negative for dysuria.  Urinary frequency secondary to BPH. Musculoskeletal: Negative for back pain. Skin: Negative for rash. Neurological: Negative for headaches, focal weakness or numbness. Endocrine: Hyperlipidemia and hypertension Hematological/Lymphatic:  Allergic/Immunilogical: Statins ____________________________________________   PHYSICAL EXAM:  VITAL SIGNS: BP 177/91  Pulse 48  Resp 16  Temp 97.3 F (36.3 C)  Temp src Temporal  SpO2 99 %  Weight 195 lb (88.5 kg)  Height 5\' 9"  (1.753 m)   Constitutional: Alert and oriented. Well appearing and in no acute distress. Eyes: Conjunctivae are normal. PERRL. EOMI. Head:  Atraumatic. Nose: No congestion/rhinnorhea. Mouth/Throat: Mucous membranes are moist.  Oropharynx non-erythematous. Neck: No stridor.  No cervical spine tenderness to palpation. Hematological/Lymphatic/Immunilogical: No cervical lymphadenopathy. Cardiovascular: Normal rate, regular rhythm. Grossly normal heart sounds.  Good peripheral circulation.  Elevated systolic blood pressure. Respiratory: Normal respiratory effort.  No retractions. Lungs CTAB. Gastrointestinal: Soft and nontender. No distention. No abdominal bruits. No CVA tenderness. Genitourinary: Deferred Musculoskeletal: No lower extremity tenderness nor edema.  No joint effusions. Neurologic:  Normal speech and language. No gross focal neurologic deficits are appreciated. No gait instability. Skin:  Skin is warm, dry and intact. No rash noted. Psychiatric: Mood and affect are normal. Speech and behavior are normal.  ____________________________________________   LABS _          Component Ref Range & Units 5 d ago (03/05/23) 11 mo ago (03/16/22) 1 yr ago (12/02/21) 1 yr ago (10/13/21) 1 yr ago (07/30/21) 1 yr ago (03/24/21) 4 yr ago (07/29/18)  Color, UA Yellow Yellow yellow  yellow   Clarity, UA Clear Clear clear   clear   Glucose, UA Negative Negative Negative Negative   Negative   Bilirubin, UA Negative Negative negative   negative   Ketones, UA Negative Negative negative   negative   Spec Grav, UA 1.010 - 1.025 1.010 1.025 1.020   1.015   Blood, UA Negative Negative negative   negative   pH, UA 5.0 - 8.0 6.5 5.5 6.0   7.0   Protein, UA Negative Negative Negative Negative   Negative   Urobilinogen, UA 0.2 or 1.0 E.U./dL 0.2 0.2 0.2   0.2   Nitrite, UA Negative Negative negative   negative   Leukocytes, UA Negative Negative Negative Negative   Negative SMALL Abnormal  R  Appearance   normal CLEAR R CLEAR R medium CLEAR Abnormal  R  Odor         Resulting Agency    [facility]          Specimen Collected: 03/05/23 09:30 Last Resulted: 03/05/23 09:30      Lab Flowsheet      Order Details      View Encounter      Lab and Collection Details      Routing      Result History    View All Conversations on this Encounter      R=Reference range differs from displayed range      Result Care Coordination   Patient Communication   Add Comments   Seen Back to Top      Other Results from 03/05/2023   Contains abnormal data CMP12+LP+TP+TSH+6AC+PSA+CBC. Order: [id number] Status: Final result      Visible to patient: Yes (seen)      Next appt: None      Dx: Routine adult health maintenance    0 Result Notes             Component Ref Range & Units 5 d ago (03/05/23) 1 mo ago (01/29/23) 3 mo ago (11/18/22) 10 mo ago (04/28/22) 10 mo ago (04/28/22) 11 mo ago (03/16/22) 1 yr ago (02/25/22) 1 yr ago (02/25/22)  Glucose 70 - 99 mg/dL 83     83    Uric Acid 3.8 - 8.4 mg/dL 5.7     6.2 CM    Comment:            Therapeutic target for gout patients: <6.0  BUN 8 - 27 mg/dL 14     16    Creatinine, Ser 0.76 - 1.27 mg/dL 3.24     4.01    eGFR >02 mL/min/1.73 74     72    BUN/Creatinine Ratio 10 - 24 13     14     Sodium 134 - 144 mmol/L 139     139    Potassium 3.5 - 5.2 mmol/L 4.6     4.8    Chloride 96 - 106 mmol/L 103     104    Calcium 8.6 - 10.2 mg/dL 9.2     9.7    Phosphorus 2.8 - 4.1 mg/dL 2.9     4.0    Total Protein 6.0 - 8.5 g/dL 6.5    7.1 6.7 6.7   Albumin 3.9 - 4.9 g/dL 4.4    4.8 R 4.4 R 4.6 R   Globulin, Total 1.5 - 4.5 g/dL 2.1     2.3    Albumin/Globulin Ratio 1.2 - 2.2  2.1     1.9    Bilirubin Total 0.0 - 1.2 mg/dL 0.5    0.3 0.6 0.6   Alkaline Phosphatase 44 - 121 IU/L 81    72 65 69   LDH 121 - 224 IU/L 183     172    AST 0 - 40 IU/L 27    30 33 30   ALT 0 - 44 IU/L 35    36 36 35   GGT 0 - 65 IU/L 50     56    Iron 38 - 169 ug/dL 308     657    Cholesterol, Total 100 - 199 mg/dL 846 962 952 841  324  401   Triglycerides 0 - 149 mg/dL 027 High  78 253 92  95  155 High   HDL >39 mg/dL 47 52 47 42  41  42  VLDL Cholesterol Cal 5 - 40 mg/dL 27 16 20 18  18  27   LDL Chol Calc (NIH) 0 - 99 mg/dL 46 53 70 60  57  59  Chol/HDL Ratio 0.0 - 5.0 ratio 2.6 2.3 CM 2.9 CM 2.9 CM  2.8 CM  3.0 CM  Comment:                                   T. Chol/HDL Ratio                                             Men  Women                               1/2 Avg.Risk  3.4    3.3                                   Avg.Risk  5.0    4.4                                2X Avg.Risk  9.6    7.1                                3X Avg.Risk 23.4   11.0  Estimated CHD Risk 0.0 - 1.0 times avg.  < 0.5      < 0.5 CM    Comment: The CHD Risk is based on the T. Chol/HDL ratio. Other factors affect CHD Risk such as hypertension, smoking, diabetes, severe obesity, and family history of premature CHD.  TSH 0.450 - 4.500 uIU/mL 2.550     1.580    T4, Total 4.5 - 12.0 ug/dL 6.2     5.8    T3 Uptake Ratio 24 - 39 % 24     26    Free Thyroxine Index 1.2 - 4.9 1.5     1.5    Prostate Specific Ag, Serum 0.0 - 4.0 ng/mL 5.6 High      3.7 CM    Comment: Roche ECLIA methodology. According to the American Urological Association, Serum PSA should decrease and remain at undetectable levels  after radical prostatectomy. The AUA defines biochemical recurrence as an initial PSA value 0.2 ng/mL or greater followed by a subsequent confirmatory PSA value 0.2 ng/mL or greater. Values obtained with different assay methods or kits cannot be used interchangeably. Results cannot be interpreted as absolute evidence of the presence or absence of malignant disease.  WBC 3.4 - 10.8 x10E3/uL 5.0     3.7    RBC 4.14 - 5.80 x10E6/uL 5.53     5.05    Hemoglobin 13.0 - 17.7 g/dL 09.8     11.9    Hematocrit 37.5 - 51.0 % 47.0     43.1    MCV 79 - 97 fL 85     85    MCH 26.6 - 33.0 pg 27.5     27.7    MCHC 31.5 - 35.7 g/dL 14.7     82.9     RDW 56.2 - 15.4 % 13.3     13.8    Platelets 150 - 450 x10E3/uL 303     288    Neutrophils Not Estab. % 54     46    Lymphs Not Estab. % 28     37    Monocytes Not Estab. % 15     14    Eos Not Estab. % 3     3    Basos Not Estab. % 0     0    Neutrophils Absolute 1.4 - 7.0 x10E3/uL 2.7     1.7    Lymphocytes Absolute 0.7 - 3.1 x10E3/uL 1.4     1.4    Monocytes Absolute 0.1 - 0.9 x10E3/uL 0.7     0.5    EOS (ABSOLUTE) 0.0 - 0.4 x10E3/uL 0.1     0.1    Basophils Absolute 0.0 - 0.2 x10E3/uL 0.0     0.0    Immature Granulocytes Not Estab. % 0     0    Immature Grans (Abs)              ___________________________________________  EKG  Sinus bradycardia 44 bpm ____________________________________________   ____________________________________________   INITIAL IMPRESSION / ASSESSMENT AND PLAN  As part of my medical decision making, I reviewed the following data within the electronic MEDICAL RECORD NUMBER       No acute findings on physical exam.  Discussed patient elevated systolic blood pressure recommended 3-day blood pressure check.     ____________________________________________   FINAL CLINICAL IMPRESSION    ED Discharge Orders     None        Note:  This document was prepared using Dragon voice recognition software and may include unintentional dictation errors.

## 2023-03-30 NOTE — Progress Notes (Signed)
Alexander Quinn presents to the clinic on 03/29/2023 stating Dr was supposed to make him an appointment with a urologist.  Informed him that a referral was sent to [facility] on 03/12/2023.  Left [facility] a voice mail - checking status of referral.  [facility] called back stating [name] has appointment scheduled for 04/14/23 at 10:15 am with Dr. Apolinar Junes.  [name] & gave him appointment details.  Followed up with a text with appointment date/time/location.  AMD

## 2023-04-09 ENCOUNTER — Encounter: Payer: Self-pay | Admitting: Physician Assistant

## 2023-04-09 ENCOUNTER — Ambulatory Visit: Payer: Self-pay | Admitting: Physician Assistant

## 2023-04-09 VITALS — BP 131/69 | HR 53 | Temp 98.4°F | Resp 16 | Ht 69.0 in | Wt 190.0 lb

## 2023-04-09 DIAGNOSIS — S46912A Strain of unspecified muscle, fascia and tendon at shoulder and upper arm level, left arm, initial encounter: Secondary | ICD-10-CM

## 2023-04-09 MED ORDER — ORPHENADRINE CITRATE ER 100 MG PO TB12: 100.0000 mg | ORAL_TABLET | Freq: Two times a day (BID) | ORAL | 0 refills | Status: AC

## 2023-04-09 MED ORDER — NAPROXEN 500 MG PO TABS: 500.0000 mg | ORAL_TABLET | Freq: Two times a day (BID) | ORAL | 0 refills | Status: AC

## 2023-04-09 NOTE — Progress Notes (Signed)
   Subjective: Right neck and shoulder pain    Patient ID: Alexander Quinn, male    DOB: 12/08/1955, 67 y.o.   MRN: [id number]  HPI Patient presents with 4 days of right lateral posterior neck and posterior shoulder pain.  Denies loss of sensation or loss of function left shoulder.  States pain increased with left left lateral neck movement no provocative incident for complaint. Review of Systems Hypertension    Objective:   Physical Exam BP 131/69  Pulse 53  Resp 16  Temp 98.4 F (36.9 C)  Temp src Temporal  SpO2 97 %  Weight 190 lb (86.2 kg)  Height 5\' 9"  (1.753 m)   BMI 28.06 kg/m2  BSA 2.05 m2  No obvious deformity of the neck right shoulder.  Patient has decreased range of motion with left lateral movements of the neck patient has full and equal range of motion of the right shoulder.  Patient has moderate guarding with palpation of the supraspinatus area.     Assessment & Plan: Muscle strain  Patient had a lidocaine patch prior to departure.  Patient given Norflex twice daily for 5 days.  Patient also given twice daily 10 days.  Patient advised to follow-up in 1 week if no improvement or worsening complaint.

## 2023-04-09 NOTE — Progress Notes (Signed)
Pt presents today with right shoulder pain x 4 days. Not injury related, pt woke up noticed first in the middle of night.

## 2023-04-13 DIAGNOSIS — H43812 Vitreous degeneration, left eye: Secondary | ICD-10-CM | POA: Diagnosis not present

## 2023-04-13 DIAGNOSIS — H35033 Hypertensive retinopathy, bilateral: Secondary | ICD-10-CM | POA: Diagnosis not present

## 2023-04-13 DIAGNOSIS — H5203 Hypermetropia, bilateral: Secondary | ICD-10-CM | POA: Diagnosis not present

## 2023-04-14 ENCOUNTER — Ambulatory Visit (INDEPENDENT_AMBULATORY_CARE_PROVIDER_SITE_OTHER): Payer: 59 | Admitting: Urology

## 2023-04-14 VITALS — BP 210/93 | HR 42 | Ht 69.0 in | Wt 190.0 lb

## 2023-04-14 DIAGNOSIS — N401 Enlarged prostate with lower urinary tract symptoms: Secondary | ICD-10-CM | POA: Diagnosis not present

## 2023-04-14 DIAGNOSIS — R972 Elevated prostate specific antigen [PSA]: Secondary | ICD-10-CM

## 2023-04-14 DIAGNOSIS — R350 Frequency of micturition: Secondary | ICD-10-CM | POA: Diagnosis not present

## 2023-04-14 LAB — BLADDER SCAN AMB NON-IMAGING: Scan Result: 51

## 2023-04-14 MED ORDER — GEMTESA 75 MG PO TABS
1.0000 | ORAL_TABLET | Freq: Every day | ORAL | 0 refills | Status: AC
Start: 2023-04-14 — End: ?

## 2023-04-14 NOTE — Patient Instructions (Signed)
Cystoscopy Cystoscopy is a procedure that is used to help diagnose and sometimes treat conditions that affect the lower urinary tract. The lower urinary tract includes the bladder and the urethra. The urethra is the tube that drains urine from the bladder. Cystoscopy is done using a thin, tube-shaped instrument with a light and camera at the end (cystoscope). The cystoscope may be hard or flexible, depending on the goal of the procedure. The cystoscope is inserted through the urethra, into the bladder. Cystoscopy may be recommended if you have: Urinary tract infections that keep coming back. Blood in the urine (hematuria). An inability to control when you urinate (urinary incontinence) or an overactive bladder. Unusual cells found in a urine sample. A blockage in the urethra, such as a urinary stone. Painful urination. An abnormality in the bladder found during an intravenous pyelogram (IVP) or CT scan. What are the risks? Generally, this is a safe procedure. However, problems may occur, including: Infection. Bleeding.  What happens during the procedure?  You will be given one or more of the following: A medicine to numb the area (local anesthetic). The area around the opening of your urethra will be cleaned. The cystoscope will be passed through your urethra into your bladder. Germ-free (sterile) fluid will flow through the cystoscope to fill your bladder. The fluid will stretch your bladder so that your health care provider can clearly examine your bladder walls. Your doctor will look at the urethra and bladder. The cystoscope will be removed The procedure may vary among health care providers  What can I expect after the procedure? After the procedure, it is common to have: Some soreness or pain in your urethra. Urinary symptoms. These include: Mild pain or burning when you urinate. Pain should stop within a few minutes after you urinate. This may last for up to a few days after the  procedure. A small amount of blood in your urine for several days. Feeling like you need to urinate but producing only a small amount of urine. Follow these instructions at home: General instructions Return to your normal activities as told by your health care provider.  Drink plenty of fluids after the procedure. Keep all follow-up visits as told by your health care provider. This is important. Contact a health care provider if you: Have pain that gets worse or does not get better with medicine, especially pain when you urinate lasting longer than 72 hours after the procedure. Have trouble urinating. Get help right away if you: Have blood clots in your urine. Have a fever or chills. Are unable to urinate. Summary Cystoscopy is a procedure that is used to help diagnose and sometimes treat conditions that affect the lower urinary tract. Cystoscopy is done using a thin, tube-shaped instrument with a light and camera at the end. After the procedure, it is common to have some soreness or pain in your urethra. It is normal to have blood in your urine after the procedure.  If you were prescribed an antibiotic medicine, take it as told by your health care provider.  This information is not intended to replace advice given to you by your health care provider. Make sure you discuss any questions you have with your health care provider. Document Revised: 10/18/2018 Document Reviewed: 10/18/2018 Elsevier Patient Education  2020 Elsevier Inc.   Transrectal Ultrasound  A transrectal ultrasound is a procedure that uses sound waves to create images of the prostate gland and nearby tissues. For this procedure, an ultrasound probe is placed   in the rectum. The probe sends sound waves through the wall of the rectum into the prostate gland. The prostate is a walnut-sized gland that is located below the bladder and in front of the rectum. The images show the size and shape of the prostate gland and nearby  structures. You may need this test if you have: Trouble urinating. Trouble getting your partner pregnant (infertility). An abnormal result from a prostate screening exam. Tell a health care provider about: Any allergies you have. All medicines you are taking, including vitamins, herbs, eye drops, creams, and over-the-counter medicines. Any bleeding problems you have. Any surgeries you have had. Any medical conditions you have. Any prostate infections you have had. What are the risks? Generally, this is a safe procedure. However, problems may occur, including: Discomfort during the procedure. Blood in your urine or sperm after the procedure. This may occur if a sample of tissue is taken to look at under a microscope (biopsy) during the procedure. What happens before the procedure? Your health care provider may instruct you to use an enema 1-4 hours before the procedure. Follow instructions from your health care provider about how to do the enema. Ask your health care provider about: Changing or stopping your regular medicines. This is especially important if you are taking diabetes medicines or blood thinners. Taking medicines such as aspirin and ibuprofen. These medicines can thin your blood. Do not take these medicines unless your health care provider tells you to take them. Taking over-the-counter medicines, vitamins, herbs, and supplements. What happens during the procedure? You will be asked to lie down on your left side on an exam table. You will bend your knees toward your chest. Gel will be put on a small probe that is about the width of a finger. The probe will be gently inserted into your rectum. You may have a feeling of fullness but should not feel pain. The probe will send signals to a computer that will create images. These will be displayed on a monitor that looks like a small television screen. The technician will slightly rotate the probe throughout the procedure. While  rotating the probe, he or she will view and capture images of the prostate gland and the surrounding structures from different angles. Your health care provider may take a biopsy sample of prostate tissue during the procedure. The images captured from the ultrasound will help guide the needle that is used to remove a sample of tissue. The sample will be sent to a lab for testing. The probe will be removed. The procedure may vary among health care providers and hospitals. What can I expect after the procedure? It is up to you to get the results of your procedure. Ask your health care provider, or the department that is doing the procedure, when your results will be ready. Keep all follow-up visits. This is important. Summary A transrectal ultrasound is a procedure that uses sound waves to create images of the prostate gland and nearby tissues. The images show the size and shape of the prostate gland and nearby structures. Before the procedure, ask your health care provider about changing or stopping your regular medicines. This is especially important if you are taking diabetes medicines or blood thinners. This information is not intended to replace advice given to you by your health care provider. Make sure you discuss any questions you have with your health care provider. Document Revised: 07/09/2021 Document Reviewed: 04/21/2021 Elsevier Patient Education  2024 Elsevier Inc.  

## 2023-04-14 NOTE — Progress Notes (Signed)
Marcelle Overlie Plume,acting as a scribe for Vanna Scotland, MD.,have documented all relevant documentation on the behalf of Vanna Scotland, MD,as directed by  Vanna Scotland, MD while in the presence of Vanna Scotland, MD.  04/14/2023 3:33 PM   Alexander Quinn 25-Feb-1956 [id number]  Referring provider: Joni Reining, PA-C [address] [place],  [place] [address]  Chief Complaint  Patient presents with   New Patient (Initial Visit)    HPI: 67 year-old male who is referred for further evaluation of BPH and elevated PSA. He has been under the care of Dr. Arrie Senate at [facility] in [place] for the past two to three years, during which he underwent two sets of prostate biopsies, both of which were negative.  His most recent PSA was 5.6  on 03/05/2023. He also had a urinalysis on the same day that was negative. His PSA the previous year was 3.7.   Today, he reports urinary frequency and has nocturia x1. He is dissatisfied with his urination. He is on Flomax 0.4 mg as well as myrbetriq 25 mg. He has not taken Myrbetriq due to its high cost and is seeking a prescription for it or an alternative.  Results for orders placed or performed in visit on 04/14/23  BLADDER SCAN AMB NON-IMAGING  Result Value Ref Range   Scan Result 51 ml     IPSS     Row Name 04/14/23 1000         International Prostate Symptom Score   How often have you had the sensation of not emptying your bladder? About half the time     How often have you had to urinate less than every two hours? More than half the time     How often have you found you stopped and started again several times when you urinated? Not at All     How often have you found it difficult to postpone urination? Less than half the time     How often have you had a weak urinary stream? About half the time     How often have you had to strain to start urination? Less than 1 in 5 times     How many times did you typically get up at night to  urinate? 1 Time     Total IPSS Score 14       Quality of Life due to urinary symptoms   If you were to spend the rest of your life with your urinary condition just the way it is now how would you feel about that? Mostly Disatisfied              Score:  1-7 Mild 8-19 Moderate 20-35 Severe    PMH: Past Medical History:  Diagnosis Date   Back pain    Hypertension    Mixed hyperlipidemia    Squamous cell carcinoma of scalp     Surgical History: Past Surgical History:  Procedure Laterality Date   BACK SURGERY     67 years of age   BIOPSY PROSTATE  03/2020   2nd Biopsy - Negative per patient   CATARACT EXTRACTION, BILATERAL Bilateral 2000   COLONOSCOPY  2020   2nd Colonoscopy - Performed in [place]   CORONARY ARTERY BYPASS GRAFT N/A 07/31/2021   Procedure: CORONARY ARTERY BYPASS GRAFTING (CABG)x 5 USING LIMA  AND RIGHT GSV. LIMA TO LAD, SVG TO OM, SVG TO DIAG., SVG TO PD-PL SEQUENTIALLY;  Surgeon: Loreli Slot, MD;  Location: [facility] OR;  Service: Open Heart Surgery;  Laterality: N/A;   ENDARTERECTOMY  07/31/2021   Procedure: ENDARTERECTOMY CORONARY ARTERY;  Surgeon: Loreli Slot, MD;  Location: [facility] OR;  Service: Open Heart Surgery;;   ENDOVEIN HARVEST OF GREATER SAPHENOUS VEIN Right 07/31/2021   Procedure: ENDOVEIN HARVEST OF GREATER SAPHENOUS VEIN;  Surgeon: Loreli Slot, MD;  Location: [facility] OR;  Service: Open Heart Surgery;  Laterality: Right;   LEFT HEART CATH AND CORONARY ANGIOGRAPHY N/A 07/28/2021   Procedure: LEFT HEART CATH AND CORONARY ANGIOGRAPHY;  Surgeon: Corky Crafts, MD;  Location: [facility] INVASIVE CV LAB;  Service: Cardiovascular;  Laterality: N/A;   LITHOTRIPSY  12/2019   Covid Screen prior to procedure & negative per patient   MENISCUS REPAIR Left 2010   TEE WITHOUT CARDIOVERSION N/A 07/31/2021   Procedure: TRANSESOPHAGEAL ECHOCARDIOGRAM (TEE);  Surgeon: Loreli Slot, MD;  Location: [facility] OR;  Service: Open Heart Surgery;   Laterality: N/A;    Home Medications:  Allergies as of 04/14/2023       Reactions   Crestor [rosuvastatin] Other (See Comments)   Headache and short term memory loss   Simvastatin-high Dose Other (See Comments)   Memory problems        Medication List        Accurate as of April 14, 2023  3:33 PM. If you have any questions, ask your nurse or doctor.          STOP taking these medications    acetaminophen 325 MG tablet Commonly known as: Tylenol   Jublia 10 % Soln Generic drug: Efinaconazole   mirabegron ER 25 MG Tb24 tablet Commonly known as: Myrbetriq   rosuvastatin 40 MG tablet Commonly known as: CRESTOR       TAKE these medications    albuterol 108 (90 Base) MCG/ACT inhaler Commonly known as: VENTOLIN HFA Inhale 2 puffs into the lungs every 6 (six) hours as needed for shortness of breath.   CINNAMON PO Take 1,000 mg by mouth daily.   clopidogrel 75 MG tablet Commonly known as: PLAVIX Take 1 tablet (75 mg total) by mouth daily.   Coenzyme Q10 100 MG capsule Take 100 mg by mouth daily.   cyanocobalamin 1000 MCG tablet Take 1,000 mcg by mouth daily.   Gemtesa 75 MG Tabs Generic drug: Vibegron Take 1 tablet (75 mg total) by mouth daily.   magnesium 30 MG tablet Take 30 mg by mouth daily.   metoprolol tartrate 25 MG tablet Commonly known as: LOPRESSOR Take 12.5 mg by mouth 2 (two) times daily.   naproxen 500 MG tablet Commonly known as: NAPROSYN Take 1 tablet (500 mg total) by mouth 2 (two) times daily with a meal.   orphenadrine 100 MG tablet Commonly known as: NORFLEX Take 1 tablet (100 mg total) by mouth 2 (two) times daily.   POTASSIUM PO Take 1 tablet by mouth daily.   pyridoxine 100 MG tablet Commonly known as: B-6 Take 100 mg by mouth daily.   tamsulosin 0.4 MG Caps capsule Commonly known as: FLOMAX Take 1 capsule (0.4 mg total) by mouth daily.   Vitamin D-3 25 MCG (1000 UT) Caps Take 1,000 Units by mouth daily.   ZINC  SULFATE PO Take 1 tablet by mouth daily.        Allergies:  Allergies  Allergen Reactions   Crestor [Rosuvastatin] Other (See Comments)    Headache and short term memory loss   Simvastatin-High Dose Other (See Comments)  Memory problems    Family History: Family History  Problem Relation Age of Onset   Cancer Mother    Alcohol abuse Mother    Alcohol abuse Father    Cancer Father    Aneurysm Maternal Grandfather     Social History:  reports that he has quit smoking. He has never used smokeless tobacco. He reports that he does not drink alcohol and does not use drugs.   Physical Exam: BP (!) 210/93   Pulse (!) 42   Ht 5\' 9"  (1.753 m)   Wt 190 lb (86.2 kg)   BMI 28.06 kg/m   Constitutional:  Alert and oriented, No acute distress. HEENT: [place] AT, moist mucus membranes.  Trachea midline, no masses. GU: Very large prostate without nodules. Neurologic: Grossly intact, no focal deficits, moving all 4 extremities. Psychiatric: Normal mood and affect.   Assessment & Plan:    1. Elevated PSA - Likely appropriate for the volume of the prostate. - Extensive workup in the past, including two negative biopsies. - Plan to continue trending PSA levels. - Obtain records from Dr. Arrie Senate at [facility] in [place]  2. BPH with urinary frequency - Likely exacerbated by his coffee intake and encouraged a behavioral modification of cutting his coffee intake in half by his next visit - He has been on Flomax chronically. He was never able to fill the myrbetriq. - We will try him on gemtesa, but likely that his underlying BPH is the causative issue - Urged him to consider further evaluation of his prostatic and bladder anatomy with a cystoscopy - Possible TRUS if previous records do not provide sufficient information. - He will let us know if the gemtesa samples are effective or not. - He is open to entertaining consideration of an outlet procedure down the road  potentially - Could also consider the addition of finasteride, we will discuss this further at his follow up visit  Return in about 1 month (around 05/14/2023) for cystscopy and possible TRUS.   [facility] [address] [place], [place] [address] [phone]

## 2023-04-15 LAB — URINALYSIS, COMPLETE
Bilirubin, UA: NEGATIVE
Glucose, UA: NEGATIVE
Ketones, UA: NEGATIVE
Leukocytes,UA: NEGATIVE
Nitrite, UA: NEGATIVE
Protein,UA: NEGATIVE
RBC, UA: NEGATIVE
Specific Gravity, UA: 1.01 (ref 1.005–1.030)
Urobilinogen, Ur: 0.2 mg/dL (ref 0.2–1.0)
pH, UA: 7 (ref 5.0–7.5)

## 2023-04-15 LAB — MICROSCOPIC EXAMINATION

## 2023-04-19 DIAGNOSIS — F688 Other specified disorders of adult personality and behavior: Secondary | ICD-10-CM | POA: Diagnosis not present

## 2023-04-19 DIAGNOSIS — R413 Other amnesia: Secondary | ICD-10-CM | POA: Diagnosis not present

## 2023-04-19 DIAGNOSIS — E538 Deficiency of other specified B group vitamins: Secondary | ICD-10-CM | POA: Diagnosis not present

## 2023-05-11 ENCOUNTER — Other Ambulatory Visit: Payer: Self-pay | Admitting: Physician Assistant

## 2023-05-11 DIAGNOSIS — R413 Other amnesia: Secondary | ICD-10-CM

## 2023-05-19 ENCOUNTER — Other Ambulatory Visit: Payer: 59 | Admitting: Urology

## 2023-06-02 DIAGNOSIS — R41844 Frontal lobe and executive function deficit: Secondary | ICD-10-CM | POA: Diagnosis not present

## 2023-06-02 DIAGNOSIS — I519 Heart disease, unspecified: Secondary | ICD-10-CM | POA: Diagnosis not present

## 2023-06-02 DIAGNOSIS — F0630 Mood disorder due to known physiological condition, unspecified: Secondary | ICD-10-CM | POA: Diagnosis not present

## 2023-06-03 ENCOUNTER — Encounter: Payer: Self-pay | Admitting: Physician Assistant

## 2023-06-03 ENCOUNTER — Ambulatory Visit: Payer: Self-pay | Admitting: Physician Assistant

## 2023-06-03 VITALS — BP 180/75 | HR 53 | Temp 97.7°F | Resp 16

## 2023-06-03 DIAGNOSIS — R051 Acute cough: Secondary | ICD-10-CM

## 2023-06-03 DIAGNOSIS — U071 COVID-19: Secondary | ICD-10-CM

## 2023-06-03 DIAGNOSIS — Z1152 Encounter for screening for COVID-19: Secondary | ICD-10-CM

## 2023-06-03 LAB — POC COVID19 BINAXNOW: SARS Coronavirus 2 Ag: POSITIVE — AB

## 2023-06-03 MED ORDER — NIRMATRELVIR/RITONAVIR (PAXLOVID)TABLET: 3.0000 | ORAL_TABLET | Freq: Two times a day (BID) | ORAL | 0 refills | Status: AC

## 2023-06-03 MED ORDER — BENZONATATE 100 MG PO CAPS: 100.0000 mg | ORAL_CAPSULE | Freq: Three times a day (TID) | ORAL | 0 refills | Status: AC | PRN

## 2023-06-03 NOTE — Progress Notes (Signed)
   Subjective:Covid-19    Patient ID: Alexander Quinn, male    DOB: [birth year], 67 y.o.   MRN: [id number]  HPI patient presents with 2 days of productive cough.  Patient return back from a trip to [place] 4 days ago.  Patient has a history x 3 after having COVID-19.  Patient denies fever or chills at this time.  Other family members have not complaining of this complaint.    Review of Systems Hyperlipidemia,,Hypertension,  and PAF    Objective:   Physical Exam  BP 180/75  Pulse 53  Resp 16  Temp 97.7 F (36.5 C)  Temp src Tympanic  SpO2 97 %  No acute distress. HEENT is unremarkable. Neck is supple lymphadenopathy or bruits. Lungs are clear to auscultation.  Patient has a productive cough. Heart is bradycardic at 53 bpm asymptomatic. COVID-19 test was positive.      Assessment & Plan: COVID-19  Patient get a prescription for Paxlovid and Tessalon Perles.  Patient to quarantine per CDC recommendations.  Patient advised to go to the emergency room if condition worsens.

## 2023-06-03 NOTE — Progress Notes (Signed)
Pt started having cough and Short OB 06/01/23. As of today ShoB when exerting his self.

## 2023-06-03 NOTE — Progress Notes (Signed)
Day 3 of complaint of cough and dyspnea with exertion.  Stated he is working and came from work.  They had a recent [place] trip.  Covid test in clinic positive.  Stated had covid about 3 times before, but never hospitalization.  Stated had URI s/s with other covids.

## 2023-06-04 ENCOUNTER — Encounter: Payer: Self-pay | Admitting: Physician Assistant

## 2023-06-14 ENCOUNTER — Encounter: Payer: Self-pay | Admitting: Physician Assistant

## 2023-06-15 ENCOUNTER — Ambulatory Visit
Admission: RE | Admit: 2023-06-15 | Discharge: 2023-06-15 | Disposition: A | Discharge status: Home / Self Care | Payer: 59 | Source: Ambulatory Visit | Attending: Physician Assistant | Admitting: Physician Assistant

## 2023-06-15 ENCOUNTER — Other Ambulatory Visit: Payer: 59 | Admitting: Urology

## 2023-06-15 ENCOUNTER — Encounter: Payer: Self-pay | Admitting: Urology

## 2023-06-15 DIAGNOSIS — R413 Other amnesia: Secondary | ICD-10-CM

## 2023-06-15 MED ORDER — GADOPICLENOL 0.5 MMOL/ML IV SOLN
7.5000 mL | Freq: Once | INTRAVENOUS | Status: AC | PRN
  Administered 2023-06-15: 7.5 mL via INTRAVENOUS

## 2023-06-20 DIAGNOSIS — R569 Unspecified convulsions: Secondary | ICD-10-CM | POA: Diagnosis not present

## 2023-07-01 DIAGNOSIS — F1014 Alcohol abuse with alcohol-induced mood disorder: Secondary | ICD-10-CM | POA: Diagnosis not present

## 2023-07-01 DIAGNOSIS — F060 Psychotic disorder with hallucinations due to known physiological condition: Secondary | ICD-10-CM | POA: Diagnosis not present

## 2023-07-01 DIAGNOSIS — F062 Psychotic disorder with delusions due to known physiological condition: Secondary | ICD-10-CM | POA: Diagnosis not present

## 2023-07-01 DIAGNOSIS — G3184 Mild cognitive impairment, so stated: Secondary | ICD-10-CM | POA: Diagnosis not present

## 2023-07-01 DIAGNOSIS — G3183 Dementia with Lewy bodies: Secondary | ICD-10-CM | POA: Diagnosis not present

## 2023-07-05 DIAGNOSIS — G3109 Other frontotemporal dementia: Secondary | ICD-10-CM | POA: Diagnosis not present

## 2023-07-05 DIAGNOSIS — R4189 Other symptoms and signs involving cognitive functions and awareness: Secondary | ICD-10-CM | POA: Diagnosis not present

## 2023-07-05 DIAGNOSIS — R413 Other amnesia: Secondary | ICD-10-CM | POA: Diagnosis not present

## 2023-07-05 DIAGNOSIS — F688 Other specified disorders of adult personality and behavior: Secondary | ICD-10-CM | POA: Diagnosis not present

## 2023-07-05 DIAGNOSIS — F22 Delusional disorders: Secondary | ICD-10-CM | POA: Diagnosis not present

## 2023-07-05 DIAGNOSIS — R441 Visual hallucinations: Secondary | ICD-10-CM | POA: Diagnosis not present

## 2023-07-05 DIAGNOSIS — F02818 Dementia in other diseases classified elsewhere, unspecified severity, with other behavioral disturbance: Secondary | ICD-10-CM | POA: Diagnosis not present

## 2023-07-06 DIAGNOSIS — G3184 Mild cognitive impairment, so stated: Secondary | ICD-10-CM | POA: Diagnosis not present

## 2023-07-13 ENCOUNTER — Other Ambulatory Visit: Payer: Self-pay

## 2023-07-13 DIAGNOSIS — F688 Other specified disorders of adult personality and behavior: Secondary | ICD-10-CM | POA: Diagnosis not present

## 2023-07-13 DIAGNOSIS — R4189 Other symptoms and signs involving cognitive functions and awareness: Secondary | ICD-10-CM | POA: Diagnosis not present

## 2023-07-13 DIAGNOSIS — R441 Visual hallucinations: Secondary | ICD-10-CM | POA: Diagnosis not present

## 2023-07-13 DIAGNOSIS — F0630 Mood disorder due to known physiological condition, unspecified: Secondary | ICD-10-CM | POA: Diagnosis not present

## 2023-07-13 DIAGNOSIS — Z951 Presence of aortocoronary bypass graft: Secondary | ICD-10-CM

## 2023-07-13 DIAGNOSIS — F02818 Dementia in other diseases classified elsewhere, unspecified severity, with other behavioral disturbance: Secondary | ICD-10-CM | POA: Diagnosis not present

## 2023-07-13 DIAGNOSIS — R41844 Frontal lobe and executive function deficit: Secondary | ICD-10-CM | POA: Diagnosis not present

## 2023-07-13 DIAGNOSIS — F22 Delusional disorders: Secondary | ICD-10-CM | POA: Diagnosis not present

## 2023-07-13 DIAGNOSIS — R202 Paresthesia of skin: Secondary | ICD-10-CM | POA: Diagnosis not present

## 2023-07-13 DIAGNOSIS — M549 Dorsalgia, unspecified: Secondary | ICD-10-CM | POA: Diagnosis not present

## 2023-07-13 DIAGNOSIS — G3109 Other frontotemporal dementia: Secondary | ICD-10-CM | POA: Diagnosis not present

## 2023-07-13 DIAGNOSIS — R413 Other amnesia: Secondary | ICD-10-CM | POA: Diagnosis not present

## 2023-07-13 DIAGNOSIS — M5430 Sciatica, unspecified side: Secondary | ICD-10-CM | POA: Diagnosis not present

## 2023-07-13 MED ORDER — CLOPIDOGREL BISULFATE 75 MG PO TABS
75.0000 mg | ORAL_TABLET | Freq: Every day | ORAL | 9 refills | Status: AC
Start: 2023-07-13 — End: ?

## 2023-08-11 DIAGNOSIS — R413 Other amnesia: Secondary | ICD-10-CM | POA: Diagnosis not present

## 2023-08-11 DIAGNOSIS — R4189 Other symptoms and signs involving cognitive functions and awareness: Secondary | ICD-10-CM | POA: Diagnosis not present

## 2023-08-11 DIAGNOSIS — F02818 Dementia in other diseases classified elsewhere, unspecified severity, with other behavioral disturbance: Secondary | ICD-10-CM | POA: Diagnosis not present

## 2023-08-11 DIAGNOSIS — G3109 Other frontotemporal dementia: Secondary | ICD-10-CM | POA: Diagnosis not present

## 2023-08-11 DIAGNOSIS — F22 Delusional disorders: Secondary | ICD-10-CM | POA: Diagnosis not present

## 2023-08-11 DIAGNOSIS — R202 Paresthesia of skin: Secondary | ICD-10-CM | POA: Diagnosis not present

## 2023-08-11 DIAGNOSIS — R41844 Frontal lobe and executive function deficit: Secondary | ICD-10-CM | POA: Diagnosis not present

## 2023-08-17 ENCOUNTER — Other Ambulatory Visit: Payer: Self-pay

## 2023-08-17 DIAGNOSIS — Z951 Presence of aortocoronary bypass graft: Secondary | ICD-10-CM

## 2023-08-17 DIAGNOSIS — I10 Essential (primary) hypertension: Secondary | ICD-10-CM

## 2023-08-17 MED ORDER — METOPROLOL TARTRATE 25 MG PO TABS: 12.5000 mg | ORAL_TABLET | Freq: Two times a day (BID) | ORAL | 3 refills | Status: AC

## 2023-08-25 DIAGNOSIS — F688 Other specified disorders of adult personality and behavior: Secondary | ICD-10-CM | POA: Diagnosis not present

## 2023-08-25 DIAGNOSIS — F0630 Mood disorder due to known physiological condition, unspecified: Secondary | ICD-10-CM | POA: Diagnosis not present

## 2023-08-25 DIAGNOSIS — R41844 Frontal lobe and executive function deficit: Secondary | ICD-10-CM | POA: Diagnosis not present

## 2023-08-25 DIAGNOSIS — R4189 Other symptoms and signs involving cognitive functions and awareness: Secondary | ICD-10-CM | POA: Diagnosis not present

## 2023-08-25 DIAGNOSIS — G3109 Other frontotemporal dementia: Secondary | ICD-10-CM | POA: Diagnosis not present

## 2023-08-25 DIAGNOSIS — R441 Visual hallucinations: Secondary | ICD-10-CM | POA: Diagnosis not present

## 2023-08-25 DIAGNOSIS — R413 Other amnesia: Secondary | ICD-10-CM | POA: Diagnosis not present

## 2023-08-25 DIAGNOSIS — R202 Paresthesia of skin: Secondary | ICD-10-CM | POA: Diagnosis not present

## 2023-08-25 DIAGNOSIS — F02818 Dementia in other diseases classified elsewhere, unspecified severity, with other behavioral disturbance: Secondary | ICD-10-CM | POA: Diagnosis not present

## 2023-08-25 DIAGNOSIS — R4182 Altered mental status, unspecified: Secondary | ICD-10-CM | POA: Diagnosis not present

## 2023-08-25 DIAGNOSIS — F22 Delusional disorders: Secondary | ICD-10-CM | POA: Diagnosis not present

## 2023-08-25 DIAGNOSIS — M549 Dorsalgia, unspecified: Secondary | ICD-10-CM | POA: Diagnosis not present

## 2023-09-08 DIAGNOSIS — R200 Anesthesia of skin: Secondary | ICD-10-CM | POA: Diagnosis not present

## 2023-09-09 DIAGNOSIS — F0630 Mood disorder due to known physiological condition, unspecified: Secondary | ICD-10-CM | POA: Diagnosis not present

## 2023-09-09 DIAGNOSIS — R4189 Other symptoms and signs involving cognitive functions and awareness: Secondary | ICD-10-CM | POA: Diagnosis not present

## 2023-09-09 DIAGNOSIS — M549 Dorsalgia, unspecified: Secondary | ICD-10-CM | POA: Diagnosis not present

## 2023-09-09 DIAGNOSIS — R41844 Frontal lobe and executive function deficit: Secondary | ICD-10-CM | POA: Diagnosis not present

## 2023-09-09 DIAGNOSIS — R202 Paresthesia of skin: Secondary | ICD-10-CM | POA: Diagnosis not present

## 2023-09-09 DIAGNOSIS — F02818 Dementia in other diseases classified elsewhere, unspecified severity, with other behavioral disturbance: Secondary | ICD-10-CM | POA: Diagnosis not present

## 2023-09-09 DIAGNOSIS — G3109 Other frontotemporal dementia: Secondary | ICD-10-CM | POA: Diagnosis not present

## 2023-09-09 DIAGNOSIS — F22 Delusional disorders: Secondary | ICD-10-CM | POA: Diagnosis not present

## 2023-09-09 DIAGNOSIS — R413 Other amnesia: Secondary | ICD-10-CM | POA: Diagnosis not present

## 2023-09-09 DIAGNOSIS — M5430 Sciatica, unspecified side: Secondary | ICD-10-CM | POA: Diagnosis not present

## 2023-09-09 DIAGNOSIS — F688 Other specified disorders of adult personality and behavior: Secondary | ICD-10-CM | POA: Diagnosis not present

## 2023-09-09 DIAGNOSIS — R441 Visual hallucinations: Secondary | ICD-10-CM | POA: Diagnosis not present

## 2023-09-23 DIAGNOSIS — G3109 Other frontotemporal dementia: Secondary | ICD-10-CM | POA: Diagnosis not present

## 2023-09-23 DIAGNOSIS — R413 Other amnesia: Secondary | ICD-10-CM | POA: Diagnosis not present

## 2023-09-23 DIAGNOSIS — F0630 Mood disorder due to known physiological condition, unspecified: Secondary | ICD-10-CM | POA: Diagnosis not present

## 2023-09-23 DIAGNOSIS — M549 Dorsalgia, unspecified: Secondary | ICD-10-CM | POA: Diagnosis not present

## 2023-09-23 DIAGNOSIS — R41844 Frontal lobe and executive function deficit: Secondary | ICD-10-CM | POA: Diagnosis not present

## 2023-09-23 DIAGNOSIS — F02818 Dementia in other diseases classified elsewhere, unspecified severity, with other behavioral disturbance: Secondary | ICD-10-CM | POA: Diagnosis not present

## 2023-09-23 DIAGNOSIS — F688 Other specified disorders of adult personality and behavior: Secondary | ICD-10-CM | POA: Diagnosis not present

## 2023-09-23 DIAGNOSIS — F22 Delusional disorders: Secondary | ICD-10-CM | POA: Diagnosis not present

## 2023-09-23 DIAGNOSIS — R200 Anesthesia of skin: Secondary | ICD-10-CM | POA: Diagnosis not present

## 2023-09-23 DIAGNOSIS — R202 Paresthesia of skin: Secondary | ICD-10-CM | POA: Diagnosis not present

## 2023-09-23 DIAGNOSIS — R441 Visual hallucinations: Secondary | ICD-10-CM | POA: Diagnosis not present

## 2023-09-23 DIAGNOSIS — R4189 Other symptoms and signs involving cognitive functions and awareness: Secondary | ICD-10-CM | POA: Diagnosis not present

## 2023-10-03 IMAGING — CT CT RENAL STONE PROTOCOL
2 of 4 series · 16 of 46 positions shown, 18 images · non-contrast
Comparison: None.

CLINICAL DATA: Right flank pain

EXAM:
CT ABDOMEN AND PELVIS WITHOUT CONTRAST
TECHNIQUE: Multidetector CT imaging of the abdomen and pelvis was performed
following the standard protocol without IV contrast.

[Series 3: stone study 5.0 i30f 2 · axial · 0.97mm/px · z∈[+694,+1124]mm · 13 of 94 slices shown, 15 images]
[im 4/94  soft-tissue]
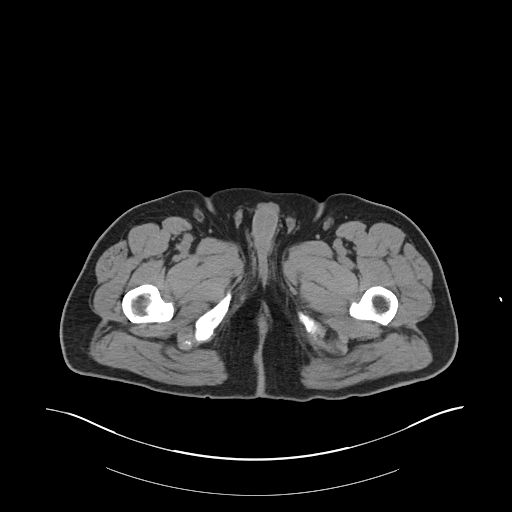
[im 4/94  bone]
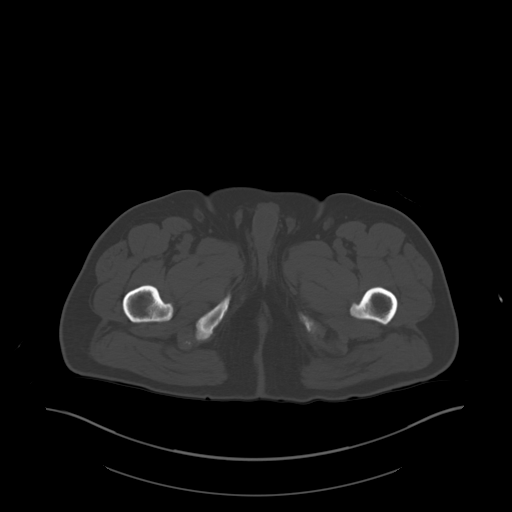
[im 12/94  soft-tissue]
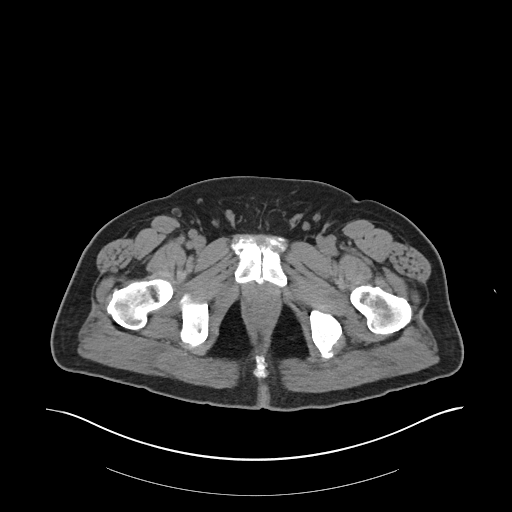
[im 20/94  soft-tissue]
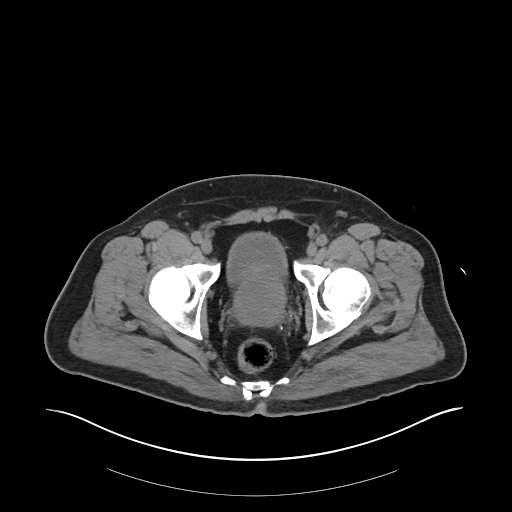
[im 28/94  soft-tissue]
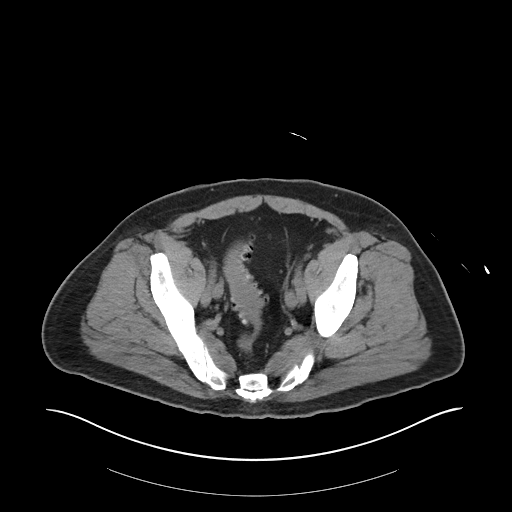
[im 32/94  soft-tissue]
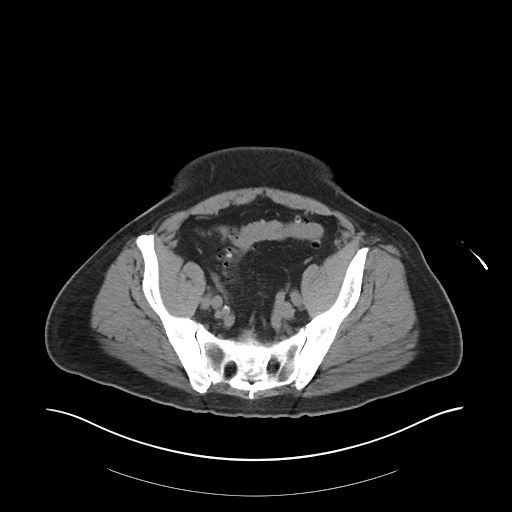
[im 39/94  soft-tissue]
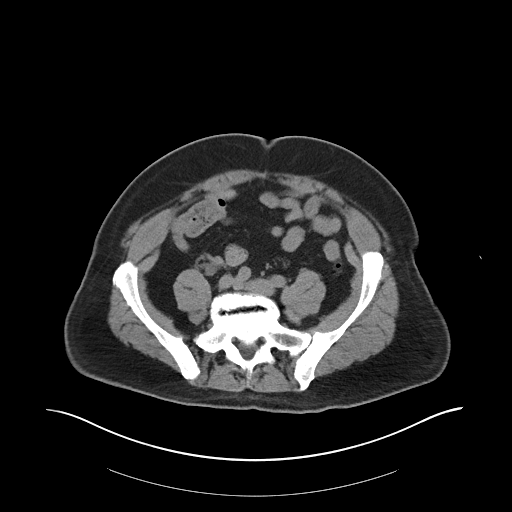
[im 47/94  soft-tissue]
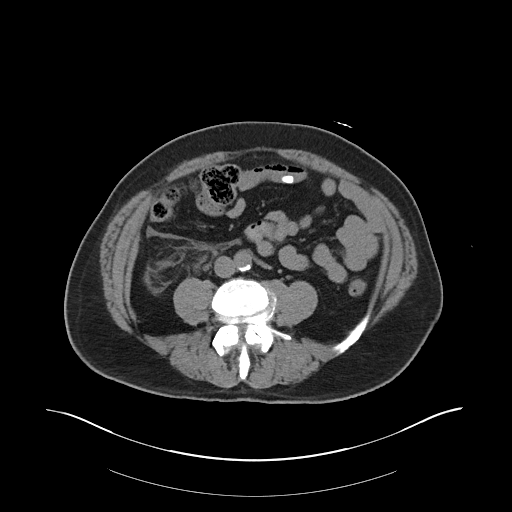
[im 55/94  soft-tissue]
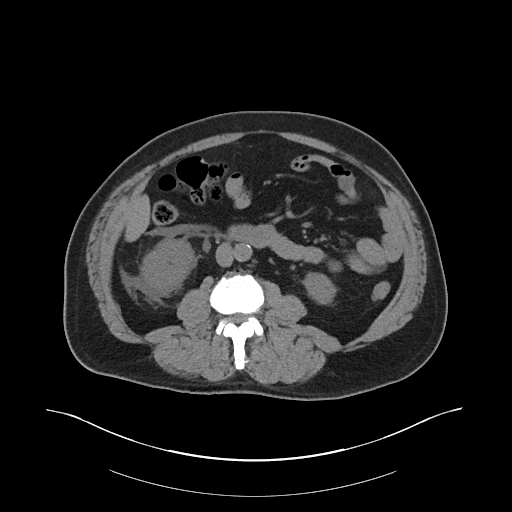
[im 63/94  soft-tissue]
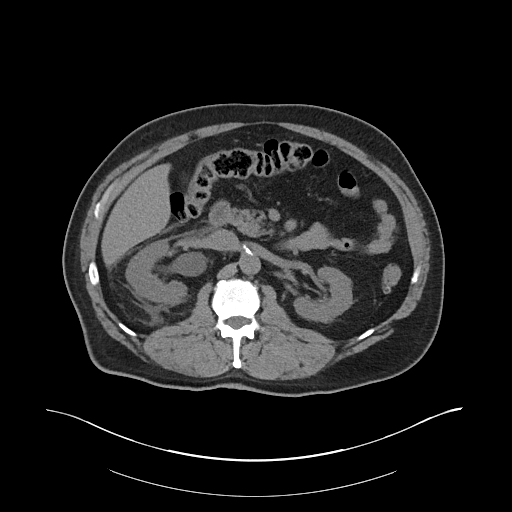
[im 63/94  bone]
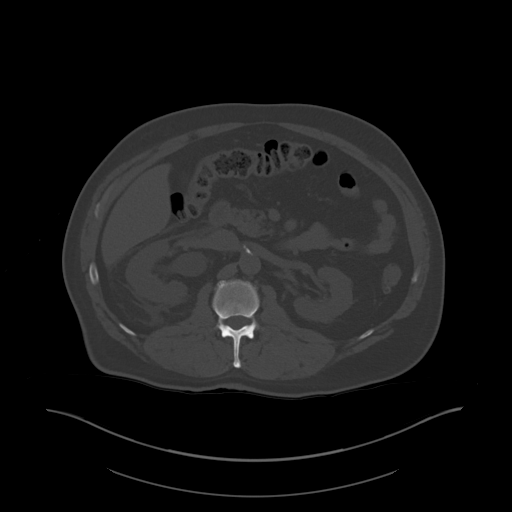
[im 66/94  soft-tissue]
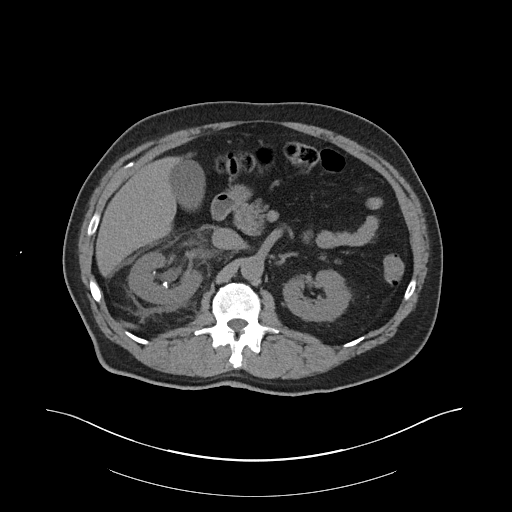
[im 74/94  soft-tissue]
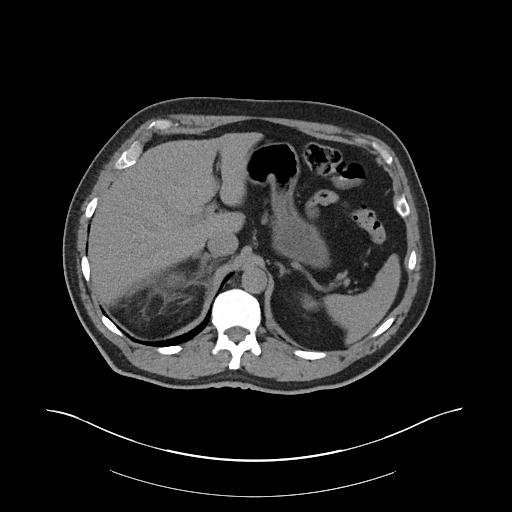
[im 82/94  soft-tissue]
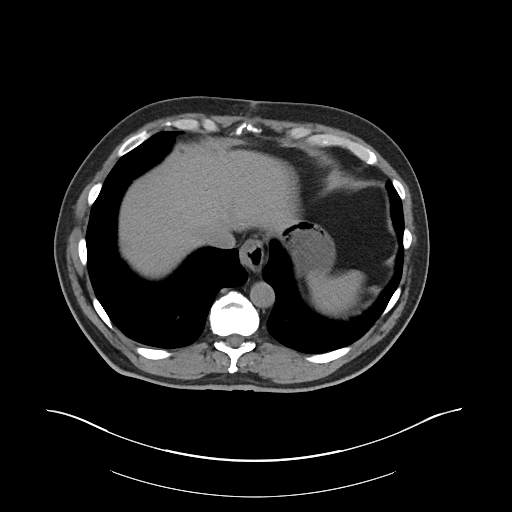
[im 90/94  soft-tissue]
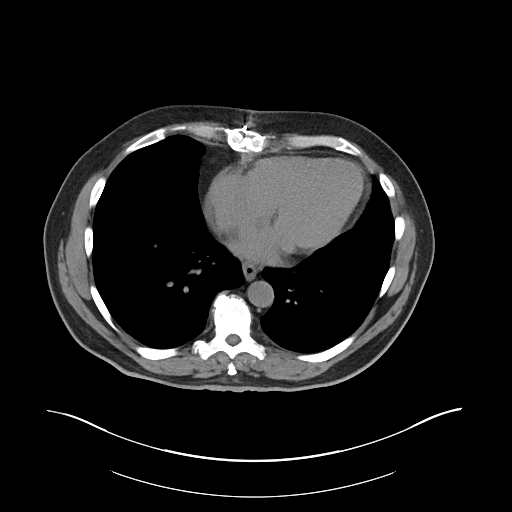

[Series 6: coronal soft tissue · coronal · 0.80mm/px · 3 of 114 slices shown]
[im 38/114  soft-tissue]
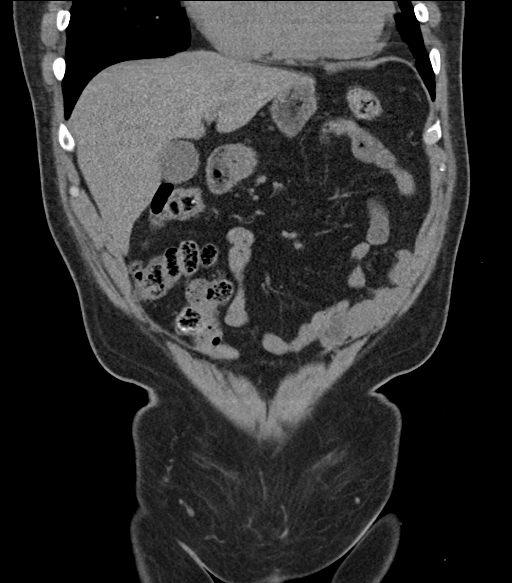
[im 51/114  soft-tissue]
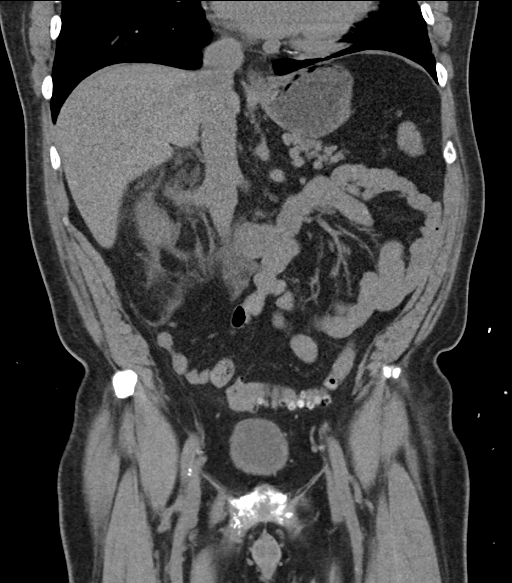
[im 63/114  soft-tissue]
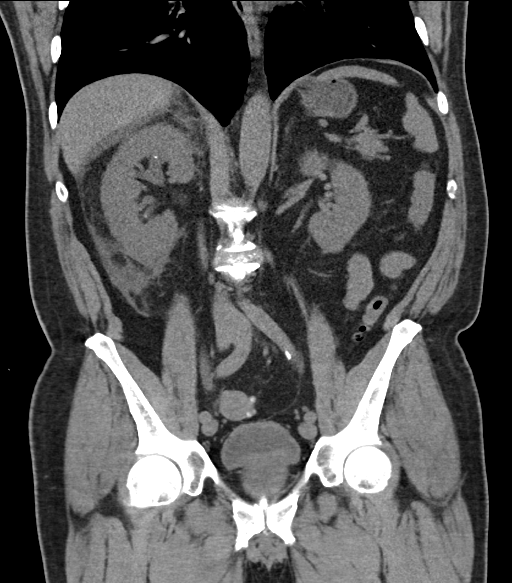

[16 of 46 positions shown; findings below may reference images not displayed]

FINDINGS: Lower chest: No acute abnormality.

Hepatobiliary: No focal liver abnormality is seen. No gallstones,
gallbladder wall thickening, or biliary dilatation.

Pancreas: Unremarkable.

Spleen: Unremarkable.

Adrenals/Urinary Tract: Adrenals are unremarkable. Too small to
characterize low-density lesion of the upper pole the left kidney.
There are multiple nonobstructing renal calculi bilaterally
measuring up to 3 mm. There is right perinephric edema and
hydronephrosis. The right ureter is dilated to the level of an
obstructing calculus distally measuring 4 mm. Partially distended
bladder is unremarkable apart from mild wall thickening that may be
due to underdistention or chronic obstruction from enlarged
prostate.

Stomach/Bowel: Stomach is within normal limits apart from a small
hiatal hernia. Bowel is normal in caliber. There is colonic
diverticulosis.

Vascular/Lymphatic: Calcified plaque is present. No enlarged lymph
nodes.

Reproductive: Enlarged prostate.

Other: Abdominal wall is unremarkable.

Musculoskeletal: Degenerative changes of the included spine.
IMPRESSION: Obstructing 4 mm calculus of the distal right ureter with proximal
hydroureteronephrosis.

Small bilateral nonobstructing renal calculi.

Bladder wall thickening may reflect underdistention or chronic
outlet obstruction from enlarged prostate.

Colonic diverticulosis.  Small hiatal hernia.

Aortic Atherosclerosis ([id number]).

## 2023-10-18 DIAGNOSIS — E785 Hyperlipidemia, unspecified: Secondary | ICD-10-CM | POA: Diagnosis not present

## 2023-10-18 DIAGNOSIS — F0390 Unspecified dementia without behavioral disturbance: Secondary | ICD-10-CM | POA: Diagnosis not present

## 2023-10-18 DIAGNOSIS — I2510 Atherosclerotic heart disease of native coronary artery without angina pectoris: Secondary | ICD-10-CM | POA: Diagnosis not present

## 2023-10-18 DIAGNOSIS — I10 Essential (primary) hypertension: Secondary | ICD-10-CM | POA: Diagnosis not present

## 2023-10-20 ENCOUNTER — Other Ambulatory Visit: Payer: Self-pay | Admitting: Neurology

## 2023-10-20 DIAGNOSIS — R4189 Other symptoms and signs involving cognitive functions and awareness: Secondary | ICD-10-CM

## 2023-10-20 DIAGNOSIS — F688 Other specified disorders of adult personality and behavior: Secondary | ICD-10-CM

## 2023-10-20 DIAGNOSIS — R41844 Frontal lobe and executive function deficit: Secondary | ICD-10-CM

## 2023-10-20 DIAGNOSIS — R202 Paresthesia of skin: Secondary | ICD-10-CM

## 2023-10-20 DIAGNOSIS — R441 Visual hallucinations: Secondary | ICD-10-CM

## 2023-10-26 ENCOUNTER — Encounter: Payer: Self-pay | Admitting: Physician Assistant

## 2023-10-26 ENCOUNTER — Ambulatory Visit: Payer: Self-pay | Admitting: Physician Assistant

## 2023-10-26 DIAGNOSIS — J020 Streptococcal pharyngitis: Secondary | ICD-10-CM

## 2023-10-26 DIAGNOSIS — R0981 Nasal congestion: Secondary | ICD-10-CM

## 2023-10-26 LAB — POC COVID19 BINAXNOW: SARS Coronavirus 2 Ag: NEGATIVE

## 2023-10-26 LAB — POCT RAPID STREP A (OFFICE): Rapid Strep A Screen: NEGATIVE

## 2023-10-26 MED ORDER — LIDOCAINE VISCOUS HCL 2 % MT SOLN: 5.0000 mL | Freq: Four times a day (QID) | OROMUCOSAL | 0 refills | Status: AC | PRN

## 2023-10-26 MED ORDER — PSEUDOEPH-BROMPHEN-DM 30-2-10 MG/5ML PO SYRP: 5.0000 mL | ORAL_SOLUTION | Freq: Four times a day (QID) | ORAL | 0 refills | Status: AC | PRN

## 2023-10-26 NOTE — Progress Notes (Signed)
   Subjective: Nasal congestion and sore     Patient ID: Alexander Quinn, male    DOB: 1956-08-18, 67 y.o.   MRN: [id number]  HPI Patient complaining of 3 days of nasal congestion, bilateral ear pain, and sore throat secondary to postnasal drainage.  Denies recent travel or known contact with COVID-19.   Review of Systems Hyperlipidemia and hypertension    Objective:   Physical Exam See nurses note for vital signs. HEENT is unremarkable except for postnasal drainage. Neck is supple for lymphadenopathy or bruits. Lungs are clear to auscultation. Heart regular rate and rhythm. Patient tested negative for COVID-19 and strep pharyngitis.       Assessment & Plan: Nasal congestion and viral pharyngitis  Discussed negative results with patient.  Patient given prescription for viscous lidocaine and Bromfed-DM.  Patient advised to follow-up in 2 days if no improvement or worsening complaint.

## 2023-10-26 NOTE — Progress Notes (Signed)
Pt presents today with sore throat, head congestion, and ear pain in both for 3 days. Pt states he has a lot of mucus draining that behind his [place] its sore.  Alexander Quinn

## 2023-11-04 ENCOUNTER — Other Ambulatory Visit: Payer: 59

## 2023-11-05 DIAGNOSIS — J209 Acute bronchitis, unspecified: Secondary | ICD-10-CM | POA: Diagnosis not present

## 2023-11-15 DIAGNOSIS — F0390 Unspecified dementia without behavioral disturbance: Secondary | ICD-10-CM | POA: Diagnosis not present

## 2023-11-15 DIAGNOSIS — I10 Essential (primary) hypertension: Secondary | ICD-10-CM | POA: Diagnosis not present

## 2023-11-15 DIAGNOSIS — I2510 Atherosclerotic heart disease of native coronary artery without angina pectoris: Secondary | ICD-10-CM | POA: Diagnosis not present

## 2023-11-15 DIAGNOSIS — E785 Hyperlipidemia, unspecified: Secondary | ICD-10-CM | POA: Diagnosis not present

## 2024-02-15 ENCOUNTER — Other Ambulatory Visit (HOSPITAL_COMMUNITY): Payer: Self-pay | Admitting: Neurology

## 2024-02-15 DIAGNOSIS — F02A3 Dementia in other diseases classified elsewhere, mild, with mood disturbance: Secondary | ICD-10-CM

## 2024-03-14 ENCOUNTER — Encounter (HOSPITAL_COMMUNITY)
Admission: RE | Admit: 2024-03-14 | Discharge: 2024-03-14 | Disposition: A | Discharge status: Home / Self Care | Source: Ambulatory Visit | Attending: Neurology | Admitting: Neurology

## 2024-03-14 DIAGNOSIS — F03A0 Unspecified dementia, mild, without behavioral disturbance, psychotic disturbance, mood disturbance, and anxiety: Secondary | ICD-10-CM | POA: Diagnosis present

## 2024-03-14 DIAGNOSIS — F02A3 Dementia in other diseases classified elsewhere, mild, with mood disturbance: Secondary | ICD-10-CM | POA: Insufficient documentation

## 2024-03-14 MED ORDER — FLORBETAPIR F 18 500-1900 MBQ/ML IV SOLN
9.7000 | Freq: Once | INTRAVENOUS | Status: AC
  Administered 2024-03-14: 9.7 via INTRAVENOUS

## 2024-05-25 ENCOUNTER — Other Ambulatory Visit (HOSPITAL_COMMUNITY): Payer: Self-pay | Admitting: Neurology

## 2024-05-25 DIAGNOSIS — G3183 Dementia with Lewy bodies: Secondary | ICD-10-CM

## 2024-06-15 ENCOUNTER — Other Ambulatory Visit (HOSPITAL_COMMUNITY)

## 2024-06-15 ENCOUNTER — Encounter (HOSPITAL_COMMUNITY): Admission: RE | Admit: 2024-06-15 | Source: Ambulatory Visit

## 2024-07-18 ENCOUNTER — Encounter (HOSPITAL_COMMUNITY)
Admission: RE | Admit: 2024-07-18 | Discharge: 2024-07-18 | Disposition: A | Discharge status: Home / Self Care | Source: Ambulatory Visit | Attending: Neurology | Admitting: Neurology

## 2024-07-18 ENCOUNTER — Encounter (HOSPITAL_COMMUNITY): Payer: Self-pay

## 2024-07-18 ENCOUNTER — Ambulatory Visit (HOSPITAL_COMMUNITY)
Admission: RE | Admit: 2024-07-18 | Discharge: 2024-07-18 | Disposition: A | Discharge status: Home / Self Care | Source: Ambulatory Visit | Attending: Neurology | Admitting: Neurology

## 2024-07-18 DIAGNOSIS — G3183 Dementia with Lewy bodies: Secondary | ICD-10-CM

## 2024-07-18 MED ORDER — POTASSIUM IODIDE (ANTIDOTE) 130 MG PO TABS
ORAL_TABLET | ORAL | Status: AC
Start: 2024-07-18 — End: 2024-07-18
  Filled 2024-07-18: qty 1

## 2024-07-18 MED ORDER — IOFLUPANE I 123 185 MBQ/2.5ML IV SOLN
4.2800 | Freq: Once | INTRAVENOUS | Status: AC
Start: 2024-07-18 — End: 2024-07-18
  Administered 2024-07-18: 4.28 via INTRAVENOUS
  Filled 2024-07-18: qty 5
